# Patient Record
Sex: Male | Born: 1955 | Race: White | Hispanic: No | Smoking: Never smoker
Health system: Southern US, Community
[De-identification: ages and names within clinical notes are randomized; demographics above are authoritative.]

## PROBLEM LIST (undated history)

## (undated) DIAGNOSIS — I10 Essential (primary) hypertension: Secondary | ICD-10-CM

## (undated) DIAGNOSIS — I2510 Atherosclerotic heart disease of native coronary artery without angina pectoris: Secondary | ICD-10-CM

## (undated) HISTORY — PX: CORONARY STENT PLACEMENT: SHX1402

## (undated) HISTORY — PX: PACEMAKER INSERTION: SHX728

---

## 2020-08-08 ENCOUNTER — Emergency Department (HOSPITAL_COMMUNITY)
Admission: EM | Admit: 2020-08-08 | Discharge: 2020-08-09 | Disposition: A | Discharge status: Home / Self Care | Payer: 59 | Attending: Emergency Medicine | Admitting: Emergency Medicine

## 2020-08-08 DIAGNOSIS — R002 Palpitations: Secondary | ICD-10-CM | POA: Diagnosis not present

## 2020-08-08 DIAGNOSIS — R600 Localized edema: Secondary | ICD-10-CM | POA: Diagnosis not present

## 2020-08-08 DIAGNOSIS — I10 Essential (primary) hypertension: Secondary | ICD-10-CM | POA: Insufficient documentation

## 2020-08-08 DIAGNOSIS — I2510 Atherosclerotic heart disease of native coronary artery without angina pectoris: Secondary | ICD-10-CM | POA: Insufficient documentation

## 2020-08-08 HISTORY — DX: Essential (primary) hypertension: I10

## 2020-08-08 HISTORY — DX: Atherosclerotic heart disease of native coronary artery without angina pectoris: I25.10

## 2020-08-09 ENCOUNTER — Encounter (HOSPITAL_COMMUNITY): Payer: Self-pay | Admitting: Emergency Medicine

## 2020-08-09 ENCOUNTER — Emergency Department (HOSPITAL_COMMUNITY): Payer: 59

## 2020-08-09 ENCOUNTER — Other Ambulatory Visit: Payer: Self-pay

## 2020-08-09 LAB — BASIC METABOLIC PANEL
Anion gap: 10 (ref 5–15)
BUN: 8 mg/dL (ref 8–23)
CO2: 26 mmol/L (ref 22–32)
Calcium: 9.4 mg/dL (ref 8.9–10.3)
Chloride: 104 mmol/L (ref 98–111)
Creatinine, Ser: 0.78 mg/dL (ref 0.61–1.24)
GFR calc Af Amer: >60 mL/min (ref >60)
GFR calc non Af Amer: >60 mL/min (ref >60)
Glucose, Bld: 129 mg/dL — ABNORMAL HIGH (ref 70–99)
Potassium: 3.4 mmol/L — ABNORMAL LOW (ref 3.5–5.1)
Sodium: 140 mmol/L (ref 135–145)

## 2020-08-09 LAB — CBC
HCT: 52.5 % — ABNORMAL HIGH (ref 39.0–52.0)
Hemoglobin: 17.2 g/dL — ABNORMAL HIGH (ref 13.0–17.0)
MCH: 29.8 pg (ref 26.0–34.0)
MCHC: 32.8 g/dL (ref 30.0–36.0)
MCV: 91 fL (ref 80.0–100.0)
Platelets: 219 10*3/uL (ref 150–400)
RBC: 5.77 MIL/uL (ref 4.22–5.81)
RDW: 12.3 % (ref 11.5–15.5)
WBC: 5.4 10*3/uL (ref 4.0–10.5)
nRBC: 0 % (ref 0.0–0.2)

## 2020-08-09 LAB — TROPONIN I (HIGH SENSITIVITY)
Troponin I (High Sensitivity): 6 ng/L (ref <18)
Troponin I (High Sensitivity): 7 ng/L (ref <18)
Troponin I (High Sensitivity): 5 ng/L (ref <18)

## 2020-08-09 LAB — BRAIN NATRIURETIC PEPTIDE: B Natriuretic Peptide: 28 pg/mL (ref 0.0–100.0)

## 2020-08-09 NOTE — ED Notes (Signed)
[employer] Jude pace maker interrogated.  

## 2020-08-09 NOTE — ED Triage Notes (Signed)
Patient arrived with EMS from home reports palpitations " fluttering" this evening , denies chest pain, respirations unlabored , no cough or fever , history of CAD /Coronary Stents and pacemaker .

## 2020-08-09 NOTE — ED Provider Notes (Addendum)
[facility] EMERGENCY DEPARTMENT Provider Note   CSN: [id number] Arrival date & time: 08/08/20  2357     History Chief Complaint  Patient presents with  . Palpitations    Ryan Clarke is a 64 y.o. male.  The history is provided by the patient.  Palpitations Palpitations quality:  Fast Progression:  Resolved Chronicity:  New Context comment:  Hx of CAD s/p stents and defibrillator/pacer wh presents with palpitations. No ongoing chest pain. Last stent placed last summer. Recently moved from [place]. Noticed some edema in legs over the past month or so too. Relieved by:  Nothing Worsened by:  Nothing Associated symptoms: lower extremity edema   Associated symptoms: no back pain, no chest pain, no chest pressure, no cough, no diaphoresis, no dizziness, no hemoptysis, no shortness of breath, no vomiting and no weakness   Risk factors: heart disease        Past Medical History:  Diagnosis Date  . Coronary artery disease   . Hypertension     There are no problems to display for this patient.   PMH: CAD, pacemaker   No family history on file.  Social History   Tobacco Use  . Smoking status: Never Smoker  . Smokeless tobacco: Never Used  Substance Use Topics  . Alcohol use: Never  . Drug use: Never    Home Medications Prior to Admission medications   Not on File    Allergies    Patient has no known allergies.  Review of Systems   Review of Systems  Constitutional: Negative for chills, diaphoresis and fever.  HENT: Negative for ear pain and sore throat.   Eyes: Negative for pain and visual disturbance.  Respiratory: Negative for cough, hemoptysis and shortness of breath.   Cardiovascular: Positive for palpitations and leg swelling. Negative for chest pain.  Gastrointestinal: Negative for abdominal pain and vomiting.  Genitourinary: Negative for dysuria and hematuria.  Musculoskeletal: Negative for arthralgias and back pain.  Skin:  Negative for color change and rash.  Neurological: Negative for dizziness, seizures, syncope and weakness.  All other systems reviewed and are negative.   Physical Exam Updated Vital Signs  ED Triage Vitals  Enc Vitals Group     BP 08/09/20 0005 131/86     Pulse Rate 08/09/20 0005 80     Resp 08/09/20 0005 20     Temp 08/09/20 0005 98.3 F (36.8 C)     Temp Source 08/09/20 0005 Oral     SpO2 08/09/20 0005 93 %     Weight 08/09/20 0008 (!) 319 lb 10.7 oz (145 kg)     Height 08/09/20 0008 6\' 1"  (1.854 m)     Head Circumference --      Peak Flow --      Pain Score 08/09/20 0008 0     Pain Loc --      Pain Edu? --      Excl. in [place]? --     Physical Exam Vitals and nursing note reviewed.  Constitutional:      General: He is not in acute distress.    Appearance: He is well-developed. He is not ill-appearing.  HENT:     Head: Normocephalic and atraumatic.     Mouth/Throat:     Mouth: Mucous membranes are moist.  Eyes:     Extraocular Movements: Extraocular movements intact.     Conjunctiva/sclera: Conjunctivae normal.     Pupils: Pupils are equal, round, and reactive to light.  Cardiovascular:     Rate and Rhythm: Normal rate and regular rhythm.     Pulses: Normal pulses.     Heart sounds: Normal heart sounds. No murmur heard.   Pulmonary:     Effort: Pulmonary effort is normal. No respiratory distress.     Breath sounds: Normal breath sounds.  Abdominal:     Palpations: Abdomen is soft.     Tenderness: There is no abdominal tenderness.  Musculoskeletal:     Cervical back: Neck supple.     Right lower leg: Edema (1+) present.     Left lower leg: Edema (1+) present.  Skin:    General: Skin is warm and dry.     Capillary Refill: Capillary refill takes less than 2 seconds.  Neurological:     General: No focal deficit present.     Mental Status: He is alert and oriented to person, place, and time.     Cranial Nerves: No cranial nerve deficit.     Sensory: No sensory  deficit.     Motor: No weakness.     Coordination: Coordination normal.  Psychiatric:        Mood and Affect: Mood normal.     ED Results / Procedures / Treatments   Labs (all labs ordered are listed, but only abnormal results are displayed) Labs Reviewed  BASIC METABOLIC PANEL - Abnormal; Notable for the following components:      Result Value   Potassium 3.4 (*)    Glucose, Bld 129 (*)    All other components within normal limits  CBC - Abnormal; Notable for the following components:   Hemoglobin 17.2 (*)    HCT 52.5 (*)    All other components within normal limits  BRAIN NATRIURETIC PEPTIDE  TROPONIN I (HIGH SENSITIVITY)  TROPONIN I (HIGH SENSITIVITY)  TROPONIN I (HIGH SENSITIVITY)    EKG EKG Interpretation  Date/Time:  Thursday August 09 2020 08:05:44 EDT Ventricular Rate:  57 PR Interval:  190 QRS Duration: 165 QT Interval:  460 QTC Calculation: 448 R Axis:   -14 Text Interpretation: Sinus rhythm Right bundle branch block Confirmed by Lennice Sites [phone]) on 08/09/2020 8:27:14 AM   Radiology DG Chest 2 View  Result Date: 08/09/2020 CLINICAL DATA:  Cardiac palpitations EXAM: CHEST - 2 VIEW COMPARISON:  11/18/2016 FINDINGS: Cardiac shadow is stable. Pacing device is now seen in satisfactory position. Lungs are clear bilaterally. Degenerative changes of the thoracic spine are noted. IMPRESSION: No acute abnormality noted. Electronically Signed   By: Inez Catalina M.D.   On: 08/09/2020 00:56    Procedures Procedures (including critical care time)  Medications Ordered in ED Medications - No data to display  ED Course  I have reviewed the triage vital signs and the nursing notes.  Pertinent labs & imaging results that were available during my care of the patient were reviewed by me and considered in my medical decision making (see chart for details).    MDM Rules/Calculators/A&P                          Ryan Clarke is a 64 year old male who presents the  ED with palpitations.  History of CAD status post stents, status post pacemaker defibrillator.  Had some palpitations last night but has resolved.  Possibly some associated chest pressure.  No chest pain since then.  No shortness of breath.  Has noticed some swelling in his legs that has appeared over the past month  but gets better in the morning.  Not on any diuretics.  Just moved here from [place].  Last cardiac catheterization was last year.  Patient on antiplatelets.  Overall appears well.  Has some 1+ edema in both legs bilaterally but clear breath sounds.  No respiratory distress.  Has already had EKG, troponins, chest x-ray.  EKG shows right bundle branch block.  No obvious ischemic changes or ST changes.  Troponin negative x2 at 6 and 7.  Chest x-ray is clear with no signs of edema or pneumonia or pneumothorax.  No significant anemia, electrolyte abnormality, kidney injury.  Will have pacemaker evaluated and will add an additional troponin and BNP. Patient with no LOC, no syncope symptoms.  Pacemaker rep called states no issues with device, no concerning arrhythmias.  Third troponin is normal.  BNP is normal.  Doubt heart failure.  Overall patient likely with some palpitations that are PVCs are benign.  We will have him follow-up with cardiology.  Discharged in good condition.  Understands return precautions.  No chest pain to my care.  Doubt ACS.  This chart was dictated using voice recognition software.  Despite best efforts to proofread,  errors can occur which can change the documentation meaning.    Final Clinical Impression(s) / ED Diagnoses Final diagnoses:  Palpitations    Rx / DC Orders ED Discharge Orders    None       Lennice Sites, DO 08/09/20 6945    Lennice Sites, DO 08/09/20 1050

## 2020-08-14 ENCOUNTER — Other Ambulatory Visit: Payer: Self-pay

## 2020-08-14 ENCOUNTER — Encounter: Payer: Self-pay | Admitting: Cardiology

## 2020-08-14 ENCOUNTER — Ambulatory Visit (INDEPENDENT_AMBULATORY_CARE_PROVIDER_SITE_OTHER): Payer: 59 | Admitting: Cardiology

## 2020-08-14 VITALS — BP 140/60 | HR 62 | Temp 97.7°F | Ht 73.0 in | Wt 291.2 lb

## 2020-08-14 DIAGNOSIS — Z713 Dietary counseling and surveillance: Secondary | ICD-10-CM

## 2020-08-14 DIAGNOSIS — E7800 Pure hypercholesterolemia, unspecified: Secondary | ICD-10-CM

## 2020-08-14 DIAGNOSIS — R072 Precordial pain: Secondary | ICD-10-CM

## 2020-08-14 DIAGNOSIS — I25119 Atherosclerotic heart disease of native coronary artery with unspecified angina pectoris: Secondary | ICD-10-CM | POA: Diagnosis not present

## 2020-08-14 DIAGNOSIS — Z7189 Other specified counseling: Secondary | ICD-10-CM

## 2020-08-14 DIAGNOSIS — Z950 Presence of cardiac pacemaker: Secondary | ICD-10-CM | POA: Diagnosis not present

## 2020-08-14 DIAGNOSIS — I10 Essential (primary) hypertension: Secondary | ICD-10-CM | POA: Diagnosis not present

## 2020-08-14 MED ORDER — NITROGLYCERIN 0.4 MG SL SUBL: 0.4000 mg | SUBLINGUAL_TABLET | SUBLINGUAL | 3 refills | Status: DC | PRN

## 2020-08-14 NOTE — Progress Notes (Signed)
Cardiology Office Note:    Date:  08/14/2020   ID:  Eden Rho, DOB 1956-10-08, MRN [id number]  PCP:  Patient, No Pcp Per  Cardiologist:  Buford Dresser, MD  Referring MD: Lennice Sites, DO   CC: new patient consultation for palpitations  History of Present Illness:    Ezzard Ditmer is a 64 y.o. male with a hx of CAD with many stents who is seen as a new consult at the request of Lennice Sites, DO for the evaluation and management of palpitations.  ER note from 08/09/20 reviewed. No acute ECG changes, unremarkable troponins.  Per patient, he states that his chest discomfort was more like his prior angina, palpitations were only mild.  Had all of his prior cardiac care as part of [facility] in [place]. Not available in Care Everywhere. Reports history of 29 stents. Never had CABG, last stent 06/2019. Moved to [place] in 05/2020.  He reports that his usual angina is indigestion. He was having these symptoms and called 911 on 9/23. Felt better after belching. ER workup unremarkable. He endorses mild palpitations but indigestion is the main concern. [employer] checked his device and said there were no episodes. He has a dual chamber pacemaker, not an ICD. Reports that when the last stent was put in, his heart stopped. He had a pacemaker placed after this.   Has continued to have indigestion/discomfort with exertion. Not at rest. Has not tried nitro since he was in his 56s. Occurs intermittently, not clearly related to food. Is improved with belching.   Has claustrophobia, doesn't like enclosed scanners.  Denies shortness of breath at rest or with normal exertion. No PND, orthopnea, or unexpected weight gain. No syncope or palpitations. Has had more LE edema, improves with compression stockings  Past Medical History:  Diagnosis Date   Coronary artery disease    Hypertension     Past Surgical History:  Procedure Laterality Date   CORONARY STENT PLACEMENT     PACEMAKER INSERTION       Current Medications: Current Outpatient Medications on File Prior to Visit  Medication Sig   amLODipine (NORVASC) 10 MG tablet Take 10 mg by mouth daily.   aspirin EC 81 MG tablet Take 81 mg by mouth daily. Swallow whole.   atorvastatin (LIPITOR) 80 MG tablet Take 80 mg by mouth daily.   metoprolol succinate (TOPROL-XL) 50 MG 24 hr tablet Take by mouth.   prasugrel (EFFIENT) 10 MG TABS tablet Take 10 mg by mouth daily.   No current facility-administered medications on file prior to visit.     Allergies:   Patient has no known allergies.   Social History   Tobacco Use   Smoking status: Never Smoker   Smokeless tobacco: Never Used  Substance Use Topics   Alcohol use: Never   Drug use: Never    Family History: No premature history of CAD  ROS:   Please see the history of present illness.  Additional pertinent ROS: Constitutional: Negative for chills, fever, night sweats, unintentional weight loss  HENT: Negative for ear pain and hearing loss.   Eyes: Negative for loss of vision and eye pain.  Respiratory: Negative for cough, sputum, wheezing.   Cardiovascular: See HPI. Gastrointestinal: Negative for abdominal pain, melena, and hematochezia.  Genitourinary: Negative for dysuria and hematuria.  Musculoskeletal: Negative for falls and myalgias.  Skin: Negative for itching and rash.  Neurological: Negative for focal weakness, focal sensory changes and loss of consciousness.  Endo/Heme/Allergies: Does not bruise/bleed  easily.     EKGs/Labs/Other Studies Reviewed:    The following studies were reviewed today: No prior cardiac studies available  EKG:  EKG is personally reviewed.  The ekg ordered today demonstrates NSR, RBBB, prior inferior infarct  Recent Labs: 08/09/2020: B Natriuretic Peptide 28.0; BUN 8; Creatinine, Ser 0.78; Hemoglobin 17.2; Platelets 219; Potassium 3.4; Sodium 140  Recent Lipid Panel No results found for: CHOL, TRIG, HDL, CHOLHDL, VLDL,  LDLCALC, LDLDIRECT  Physical Exam:    VS:  BP 140/60    Pulse 62    Temp 97.7 F (36.5 C)    Ht 6\' 1"  (1.854 m)    Wt 291 lb 3.2 oz (132.1 kg)    SpO2 93%    BMI 38.42 kg/m     Wt Readings from Last 3 Encounters:  08/14/20 291 lb 3.2 oz (132.1 kg)  08/09/20 (!) 319 lb 10.7 oz (145 kg)    GEN: Well nourished, well developed in no acute distress HEENT: Normal, moist mucous membranes NECK: No JVD CARDIAC: regular rhythm, normal S1 and S2, no rubs or gallops. No murmurs. VASCULAR: Radial and DP pulses 2+ bilaterally. No carotid bruits RESPIRATORY:  Clear to auscultation without rales, wheezing or rhonchi  ABDOMEN: Soft, non-tender, non-distended MUSCULOSKELETAL:  Ambulates independently SKIN: Warm and dry, no edema NEUROLOGIC:  Alert and oriented x 3. No focal neuro deficits noted. PSYCHIATRIC:  Normal affect    ASSESSMENT:    1. Coronary artery disease involving native coronary artery of native heart with angina pectoris ([place])   2. Precordial pain   3. Pacemaker   4. Essential hypertension   5. Pure hypercholesterolemia   6. Counseling on health promotion and disease prevention   7. Nutritional counseling    PLAN:    Coronary artery disease, with reported 29 prior stents in [place] (no records) Possible stable angina vs. GI symptoms as cause of chest pain -given his symptoms, now only exertional, will perform stress test, 2 day, at [address] as he want as little enclosed space as possible -ordered nitroglycerin, instructed on use -instructed on red flag warning signs that need immediate medical attention -Lipids, echo at follow up -continue aspirin, prasugrel -continue atorvastatin -continue metoprolol, amlodipine. Consider imdur for antianginal  S/P Dual chamber St Jude pacemaker -will have him establish with device clinic  Hypertension: slightly elevated today -reassess at follow up -continue amlodipine, metoprolol  Hyperlipidemia: -LDL goal <70 -will check at  followup -continue atorvastatin  Cardiac risk counseling and prevention recommendations: -recommend heart healthy/Mediterranean diet, with whole grains, fruits, vegetable, fish, lean meats, nuts, and olive oil. Limit salt. -recommend moderate walking, 3-5 times/week for 30-50 minutes each session. Aim for at least 150 minutes.week. Goal should be pace of 3 miles/hours, or walking 1.5 miles in 30 minutes -recommend avoidance of tobacco products. Avoid excess alcohol. -ASCVD risk score: The ASCVD Risk score Mikey Bussing DC Jr., et al., 2013) failed to calculate for the following reasons:   Cannot find a previous HDL lab   Cannot find a previous total cholesterol lab    Plan for follow up: close follow up, within 2-3 weeks  Buford Dresser, MD, PhD [place]   [facility]    Medication Adjustments/Labs and Tests Ordered: Current medicines are reviewed at length with the patient today.  Concerns regarding medicines are outlined above.  Orders Placed This Encounter  Procedures   Ambulatory referral to Cardiac Electrophysiology   MYOCARDIAL PERFUSION IMAGING   EKG 12-Lead   Meds ordered this encounter  Medications   nitroGLYCERIN (NITROSTAT) 0.4 MG SL tablet    Sig: Place 1 tablet (0.4 mg total) under the tongue every 5 (five) minutes as needed for chest pain.    Dispense:  25 tablet    Refill:  3    Patient Instructions  Medication Instructions:   Nitroglycerin- Place 1 tablet (0.4 mg total) under the tongue every 5 (five) minutes as needed for chest pain as directed.  *If you need a refill on your cardiac medications before your next appointment, please call your pharmacy*   Lab Work: None ordered    Testing/Procedures: Your physician has requested that you have a 2 day lexiscan myoview. For further information please visit [email or web address]. Please follow instruction sheet, as given. [place] [address]    Follow-Up: At [facility], you and  your health needs are our priority.  As part of our continuing mission to provide you with exceptional heart care, we have created designated Provider Care Teams.  These Care Teams include your primary Cardiologist (physician) and Advanced Practice Providers (APPs -  Physician Assistants and Nurse Practitioners) who all work together to provide you with the care you need, when you need it.  We recommend signing up for the patient portal called "MyChart".  Sign up information is provided on this After Visit Summary.  MyChart is used to connect with patients for Virtual Visits (Telemedicine).  Patients are able to view lab/test results, encounter notes, upcoming appointments, etc.  Non-urgent messages can be sent to your provider as well.   To learn more about what you can do with MyChart, go to [email or web address].    Your next appointment:   2-3 week(s) after test procedure  The format for your next appointment:   In Person  Provider:   Buford Dresser, MD    You are scheduled for a Myocardial Perfusion Imaging Study on.  Please arrive 15 minutes prior to your appointment time for registration and insurance purposes.  The test will take approximately 3 to 4 hours to complete; you may bring reading material.  If someone comes with you to your appointment, they will need to remain in the main lobby due to limited space in the testing area. **If you are pregnant or breastfeeding, please notify the nuclear lab prior to your appointment**  How to prepare for your Myocardial Perfusion Test:  Do not eat or drink 3 hours prior to your test, except you may have water.  Do not consume products containing caffeine (regular or decaffeinated) 12 hours prior to your test. (ex: coffee, chocolate, sodas, tea).  Do bring a list of your current medications with you.  If not listed below, you may take your medications as normal.  Do wear comfortable clothes (no dresses or overalls) and walking  shoes, tennis shoes preferred (No heels or open toe shoes are allowed).  Do NOT wear cologne, perfume, aftershave, or lotions (deodorant is allowed).  If these instructions are not followed, your test will have to be rescheduled.  Please report to [address] for your test.  If you have questions or concerns about your appointment, you can call the Nuclear Lab at [phone].  If you cannot keep your appointment, please provide 24 hours notification to the Nuclear Lab, to avoid a possible $50 charge to your account.    Signed, Buford Dresser, MD PhD 08/14/2020   [place]

## 2020-08-14 NOTE — Patient Instructions (Addendum)
Medication Instructions:   Nitroglycerin- Place 1 tablet (0.4 mg total) under the tongue every 5 (five) minutes as needed for chest pain as directed.  *If you need a refill on your cardiac medications before your next appointment, please call your pharmacy*   Lab Work: None ordered    Testing/Procedures: Your physician has requested that you have a 2 day lexiscan myoview. For further information please visit [email or web address]. Please follow instruction sheet, as given. [place] [address]    Follow-Up: At [facility], you and your health needs are our priority.  As part of our continuing mission to provide you with exceptional heart care, we have created designated Provider Care Teams.  These Care Teams include your primary Cardiologist (physician) and Advanced Practice Providers (APPs -  Physician Assistants and Nurse Practitioners) who all work together to provide you with the care you need, when you need it.  We recommend signing up for the patient portal called "MyChart".  Sign up information is provided on this After Visit Summary.  MyChart is used to connect with patients for Virtual Visits (Telemedicine).  Patients are able to view lab/test results, encounter notes, upcoming appointments, etc.  Non-urgent messages can be sent to your provider as well.   To learn more about what you can do with MyChart, go to [email or web address].    Your next appointment:   2-3 week(s) after test procedure  The format for your next appointment:   In Person  Provider:   Buford Dresser, MD    You are scheduled for a Myocardial Perfusion Imaging Study on.  Please arrive 15 minutes prior to your appointment time for registration and insurance purposes.  The test will take approximately 3 to 4 hours to complete; you may bring reading material.  If someone comes with you to your appointment, they will need to remain in the main lobby due to limited space in the testing  area. **If you are pregnant or breastfeeding, please notify the nuclear lab prior to your appointment**  How to prepare for your Myocardial Perfusion Test: . Do not eat or drink 3 hours prior to your test, except you may have water. . Do not consume products containing caffeine (regular or decaffeinated) 12 hours prior to your test. (ex: coffee, chocolate, sodas, tea). . Do bring a list of your current medications with you.  If not listed below, you may take your medications as normal. . Do wear comfortable clothes (no dresses or overalls) and walking shoes, tennis shoes preferred (No heels or open toe shoes are allowed). . Do NOT wear cologne, perfume, aftershave, or lotions (deodorant is allowed). . If these instructions are not followed, your test will have to be rescheduled.  Please report to [address] for your test.  If you have questions or concerns about your appointment, you can call the Nuclear Lab at [phone].  If you cannot keep your appointment, please provide 24 hours notification to the Nuclear Lab, to avoid a possible $50 charge to your account.

## 2020-08-15 ENCOUNTER — Encounter: Payer: Self-pay | Admitting: Cardiology

## 2020-08-15 DIAGNOSIS — I2510 Atherosclerotic heart disease of native coronary artery without angina pectoris: Secondary | ICD-10-CM | POA: Insufficient documentation

## 2020-08-15 DIAGNOSIS — I10 Essential (primary) hypertension: Secondary | ICD-10-CM | POA: Insufficient documentation

## 2020-08-16 DIAGNOSIS — Z950 Presence of cardiac pacemaker: Secondary | ICD-10-CM | POA: Insufficient documentation

## 2020-08-16 NOTE — Progress Notes (Signed)
ELECTROPHYSIOLOGY CONSULT NOTE  Patient ID: Nash Bolls, MRN: [id number], DOB/AGE: 03/28/1956 64 y.o. Admit date: (Not on file) Date of Consult: 08/17/2020  Primary Physician: Patient, No Pcp Per Primary Cardiologist: Stevan Eberwein is a 64 y.o. male who is being seen today for the evaluation of pacemaker at the request of  [facility].    HPI Doyle Kunath is a 64 y.o. male  Seen to establish followup for a St Jude pacemaker implanted for pauses during catheterization in 2019. Hx of complex CAD with "29 stents," most recently 8/20 ( in [place])  50 pound weight gain since Covid.  Some dyspnea on exertion.  Some edema.  Exertional indigestion.  Seen recently in the ER.  Troponins were negative.  Discussed with Dr. [facility]  Stress testing.  Date Cr K Hgb  9/21 0.78 3.4 17.2      Past Medical History:  Diagnosis Date  . Coronary artery disease   . Hypertension       Surgical History:  Past Surgical History:  Procedure Laterality Date  . CORONARY STENT PLACEMENT    . PACEMAKER INSERTION       Home Meds: Current Meds  Medication Sig  . amLODipine (NORVASC) 10 MG tablet Take 10 mg by mouth daily.  Marland Kitchen aspirin EC 81 MG tablet Take 81 mg by mouth daily. Swallow whole.  Marland Kitchen atorvastatin (LIPITOR) 80 MG tablet Take 80 mg by mouth daily.  . metoprolol succinate (TOPROL-XL) 50 MG 24 hr tablet Take by mouth.  . nitroGLYCERIN (NITROSTAT) 0.4 MG SL tablet Place 1 tablet (0.4 mg total) under the tongue every 5 (five) minutes as needed for chest pain.  . prasugrel (EFFIENT) 10 MG TABS tablet Take 10 mg by mouth daily.    Allergies: No Known Allergies  Social History   Socioeconomic History  . Marital status: Legally Separated    Spouse name: Not on file  . Number of children: Not on file  . Years of education: Not on file  . Highest education level: Not on file  Occupational History  . Not on file  Tobacco Use  . Smoking status: Never Smoker  . Smokeless tobacco: Never Used    Substance and Sexual Activity  . Alcohol use: Never  . Drug use: Never  . Sexual activity: Not on file  Other Topics Concern  . Not on file  Social History Narrative  . Not on file   Social Determinants of Health   Financial Resource Strain:   . Difficulty of Paying Living Expenses: Not on file  Food Insecurity:   . Worried About Charity fundraiser in the Last Year: Not on file  . Ran Out of Food in the Last Year: Not on file  Transportation Needs:   . Lack of Transportation (Medical): Not on file  . Lack of Transportation (Non-Medical): Not on file  Physical Activity:   . Days of Exercise per Week: Not on file  . Minutes of Exercise per Session: Not on file  Stress:   . Feeling of Stress : Not on file  Social Connections:   . Frequency of Communication with Friends and Family: Not on file  . Frequency of Social Gatherings with Friends and Family: Not on file  . Attends Religious Services: Not on file  . Active Member of Clubs or Organizations: Not on file  . Attends Archivist Meetings: Not on file  . Marital Status: Not on file  Intimate Partner Violence:   . Fear of Current or Ex-Partner: Not on file  . Emotionally Abused: Not on file  . Physically Abused: Not on file  . Sexually Abused: Not on file     History reviewed. No pertinent family history.   ROS:  Please see the history of present illness.     All other systems reviewed and negative.    Physical Exam: Blood pressure 122/76, pulse 74, height 6\' 1"  (1.854 m), weight 294 lb 9.6 oz (133.6 kg), SpO2 93 %. General: Well developed obese male in no acute distress. Head: Normocephalic, atraumatic, sclera non-icteric, no xanthomas, nares are without discharge. EENT: normal  Lymph Nodes:  none Neck: Negative for carotid bruits. JVD not elevated. Back:without scoliosis kyphosis Lungs: Clear bilaterally to auscultation without wheezes, rales, or rhonchi. Breathing is unlabored. Heart: RRR with S1 S2.  No  murmur . No rubs, or gallops appreciated. Abdomen: Soft, non-tender, non-distended with normoactive bowel sounds. No hepatomegaly. No rebound/guarding. No obvious abdominal masses. Msk:  Strength and tone appear normal for age. Extremities: No clubbing or cyanosis. No edema.  Distal pedal pulses are 2+ and equal bilaterally. Skin: Warm and Dry Neuro: Alert and oriented X 3. CN III-XII intact Grossly normal sensory and motor function . Psych:  Responds to questions appropriately with a normal affect.      Labs: Cardiac Enzymes No results for input(s): CKTOTAL, CKMB, TROPONINI in the last 72 hours. CBC Lab Results  Component Value Date   WBC 5.4 08/09/2020   HGB 17.2 (H) 08/09/2020   HCT 52.5 (H) 08/09/2020   MCV 91.0 08/09/2020   PLT 219 08/09/2020   PROTIME: No results for input(s): LABPROT, INR in the last 72 hours. Chemistry No results for input(s): NA, K, CL, CO2, BUN, CREATININE, CALCIUM, PROT, BILITOT, ALKPHOS, ALT, AST, GLUCOSE in the last 168 hours.  Invalid input(s): LABALBU Lipids No results found for: CHOL, HDL, LDLCALC, TRIG BNP No results found for: PROBNP Thyroid Function Tests: No results for input(s): TSH, T4TOTAL, T3FREE, THYROIDAB in the last 72 hours.  Invalid input(s): FREET3 Miscellaneous No results found for: DDIMER  Radiology/Studies:  DG Chest 2 View  Result Date: 08/09/2020 CLINICAL DATA:  Cardiac palpitations EXAM: CHEST - 2 VIEW COMPARISON:  11/18/2016 FINDINGS: Cardiac shadow is stable. Pacing device is now seen in satisfactory position. Lungs are clear bilaterally. Degenerative changes of the thoracic spine are noted. IMPRESSION: No acute abnormality noted. Electronically Signed   By: Inez Catalina M.D.   On: 08/09/2020 00:56    EKG: Sinus at 73 Intervals 20/15/43 Right bundle branch block   Assessment and Plan:  Pacemaker-Saint Jude  Pauses  Ischemic heart disease multiple stents  Exertional chest  discomfort  Obesity  Hypertension  Polycythemia   Patient's device function is normal.  We will establish office in remote follow-up.  Exertional chest discomfort is concerning.  He is scheduled for noninvasive imaging; I wonder whether the pretest likelihood's are not sufficiently high that a posttest probability is good be too low if the test is negative.  I reached out to Dr. [facility] and will discuss this with her.  Blood pressure is well controlled.  Urged exercise and weight loss.  Polycythemia and review of labs from [place] 3/22 also over 17.5.  We will check erythropoietin level  He has moved to [place] because his daughter and granddaughter live here.        Virl Axe '

## 2020-08-17 ENCOUNTER — Encounter: Payer: Self-pay | Admitting: Internal Medicine

## 2020-08-17 ENCOUNTER — Ambulatory Visit (INDEPENDENT_AMBULATORY_CARE_PROVIDER_SITE_OTHER): Payer: 59 | Admitting: Internal Medicine

## 2020-08-17 ENCOUNTER — Other Ambulatory Visit: Payer: Self-pay

## 2020-08-17 VITALS — BP 122/76 | HR 74 | Ht 73.0 in | Wt 294.6 lb

## 2020-08-17 DIAGNOSIS — I2510 Atherosclerotic heart disease of native coronary artery without angina pectoris: Secondary | ICD-10-CM

## 2020-08-17 DIAGNOSIS — E785 Hyperlipidemia, unspecified: Secondary | ICD-10-CM | POA: Diagnosis not present

## 2020-08-17 DIAGNOSIS — I455 Other specified heart block: Secondary | ICD-10-CM | POA: Diagnosis not present

## 2020-08-17 DIAGNOSIS — Z950 Presence of cardiac pacemaker: Secondary | ICD-10-CM | POA: Diagnosis not present

## 2020-08-17 NOTE — Patient Instructions (Addendum)
Medication Instructions:  Your physician recommends that you continue on your current medications as directed. Please refer to the Current Medication list given to you today.  *If you need a refill on your cardiac medications before your next appointment, please call your pharmacy*   Lab Work: Lipid Profile with Direct LDL and Erythropoietin level today  If you have labs (blood work) drawn today and your tests are completely normal, you will receive your results only by: Marland Kitchen MyChart Message (if you have MyChart) OR . A paper copy in the mail If you have any lab test that is abnormal or we need to change your treatment, we will call you to review the results.   Testing/Procedures: None ordered.    Follow-Up: At [facility], you and your health needs are our priority.  As part of our continuing mission to provide you with exceptional heart care, we have created designated Provider Care Teams.  These Care Teams include your primary Cardiologist (physician) and Advanced Practice Providers (APPs -  Physician Assistants and Nurse Practitioners) who all work together to provide you with the care you need, when you need it.  We recommend signing up for the patient portal called "MyChart".  Sign up information is provided on this After Visit Summary.  MyChart is used to connect with patients for Virtual Visits (Telemedicine).  Patients are able to view lab/test results, encounter notes, upcoming appointments, etc.  Non-urgent messages can be sent to your provider as well.   To learn more about what you can do with MyChart, go to [email or web address].    Your next appointment:  9 months with Dr Caryl Comes

## 2020-08-19 LAB — LIPID PANEL
Chol/HDL Ratio: 3 ratio (ref 0.0–5.0)
Cholesterol, Total: 96 mg/dL — ABNORMAL LOW (ref 100–199)
HDL: 32 mg/dL — ABNORMAL LOW (ref >39)
LDL Chol Calc (NIH): 35 mg/dL (ref 0–99)
Triglycerides: 174 mg/dL — ABNORMAL HIGH (ref 0–149)
VLDL Cholesterol Cal: 29 mg/dL (ref 5–40)

## 2020-08-19 LAB — ERYTHROPOIETIN: Erythropoietin: 7.6 m[IU]/mL (ref 2.6–18.5)

## 2020-08-19 LAB — LDL CHOLESTEROL, DIRECT: LDL Direct: 33 mg/dL (ref 0–99)

## 2020-08-29 ENCOUNTER — Telehealth (HOSPITAL_COMMUNITY): Payer: Self-pay | Admitting: *Deleted

## 2020-08-29 NOTE — Telephone Encounter (Signed)
Left message on voicemail per DPR in reference to upcoming appointment scheduled on 09/03/20 at 1:15 with detailed instructions given per Myocardial Perfusion Study Information Sheet for the test. LM to arrive 15 minutes early, and that it is imperative to arrive on time for appointment to keep from having the test rescheduled. If you need to cancel or reschedule your appointment, please call the office within 24 hours of your appointment. Failure to do so may result in a cancellation of your appointment, and a $50 no show fee. Phone number given for call back for any questions.

## 2020-08-30 ENCOUNTER — Telehealth: Payer: Self-pay

## 2020-08-30 NOTE — Telephone Encounter (Signed)
I spoke with [facility] to request the pt to be release. She states she will talk with the nurse practitioner and have her call back.

## 2020-08-31 NOTE — Telephone Encounter (Signed)
Kurien from [employer] in [place] called and stated that the request has been released.  The pt just has to be enrolled on [facility]lin now

## 2020-08-31 NOTE — Telephone Encounter (Signed)
Enrolled in [facility] clinic.

## 2020-09-03 ENCOUNTER — Ambulatory Visit (HOSPITAL_COMMUNITY): Payer: 59

## 2020-09-03 ENCOUNTER — Telehealth: Payer: Self-pay

## 2020-09-03 ENCOUNTER — Telehealth: Payer: Self-pay | Admitting: Cardiology

## 2020-09-03 ENCOUNTER — Encounter (HOSPITAL_COMMUNITY): Payer: Self-pay

## 2020-09-03 NOTE — Telephone Encounter (Signed)
Spoke to patient he stated he cancelled Lexiscan that was scheduled for today.Stated he had chest pain last night that was relieved by NTG x 1.Stated he would like to have cardiac cath scheduled instead.Advised I will send message to Dr.Christopher.

## 2020-09-03 NOTE — Telephone Encounter (Signed)
Called patient no answer.LMTC. 

## 2020-09-03 NOTE — Telephone Encounter (Signed)
Merlin alert received, long AT/AT episode in progress.  Reviewed chart, noted pt does not have history of AF.  Reviewed with Dr. Rayann Heman who confirmed AF with RVR at times, he advised if pt not currently having chest pain, refer to AF clinic.  Spoke with pt, he reports at approx 1 or 2 am he awoke with racing heart and chest pain.  He took a NTG tab and it resolved.  He was supposed to have a stress test today, but he called and cancelled it due to the chest pain episode overnight.  He had spoke with Nurse for Dr. Harrell Gave about possibly having a LHC instead given history of CAD.  Assisted pt with sending manual transmission. Confirmed pt still in AF and episode at 1am was possibly RVR as he had 5 minute HVR at 01:22.    Pt meds include Metoprolol 25mg  daily, effient and ASA.    Educated p on Afib and concerns including stroke risk and symptoms.  Povided with Ed precautions including unresolved chest pan or if HR >120bpm for more than 30 minutes of rest.  Also instructed pt that if HR races can take one extra dose of Metoprolol (med list indicates dose if 50mg  daily).    Pt agreeable with AF clinic and understands he will receive phone call to set up appt.    Presenting Rhythm  Onset    Possibly sypmtomaticRVR episode

## 2020-09-03 NOTE — Telephone Encounter (Signed)
New message:     Patient stating that he can not do the stress test due to him having some SOB. Please call patient back

## 2020-09-03 NOTE — Telephone Encounter (Signed)
Please schedule him with an appointment so that we can discuss cath. If he has severe unrelenting chest pain in the interim he should call 911.

## 2020-09-04 ENCOUNTER — Other Ambulatory Visit: Payer: Self-pay

## 2020-09-04 ENCOUNTER — Ambulatory Visit (HOSPITAL_COMMUNITY)
Admission: RE | Admit: 2020-09-04 | Discharge: 2020-09-04 | Disposition: A | Discharge status: Home / Self Care | Payer: 59 | Source: Ambulatory Visit | Attending: Physician Assistant | Admitting: Physician Assistant

## 2020-09-04 ENCOUNTER — Ambulatory Visit (HOSPITAL_COMMUNITY): Payer: 59

## 2020-09-04 ENCOUNTER — Telehealth (HOSPITAL_COMMUNITY): Payer: Self-pay

## 2020-09-04 VITALS — BP 128/94 | HR 77 | Ht 73.0 in | Wt 292.8 lb

## 2020-09-04 DIAGNOSIS — Z6838 Body mass index (BMI) 38.0-38.9, adult: Secondary | ICD-10-CM | POA: Diagnosis not present

## 2020-09-04 DIAGNOSIS — D6869 Other thrombophilia: Secondary | ICD-10-CM | POA: Diagnosis not present

## 2020-09-04 DIAGNOSIS — E785 Hyperlipidemia, unspecified: Secondary | ICD-10-CM | POA: Insufficient documentation

## 2020-09-04 DIAGNOSIS — I2510 Atherosclerotic heart disease of native coronary artery without angina pectoris: Secondary | ICD-10-CM | POA: Diagnosis not present

## 2020-09-04 DIAGNOSIS — E669 Obesity, unspecified: Secondary | ICD-10-CM | POA: Diagnosis not present

## 2020-09-04 DIAGNOSIS — Z7982 Long term (current) use of aspirin: Secondary | ICD-10-CM | POA: Diagnosis not present

## 2020-09-04 DIAGNOSIS — I480 Paroxysmal atrial fibrillation: Secondary | ICD-10-CM | POA: Diagnosis present

## 2020-09-04 DIAGNOSIS — I10 Essential (primary) hypertension: Secondary | ICD-10-CM | POA: Diagnosis not present

## 2020-09-04 DIAGNOSIS — Z79899 Other long term (current) drug therapy: Secondary | ICD-10-CM | POA: Insufficient documentation

## 2020-09-04 DIAGNOSIS — Z950 Presence of cardiac pacemaker: Secondary | ICD-10-CM | POA: Insufficient documentation

## 2020-09-04 NOTE — Telephone Encounter (Signed)
Patient returned my call and is aware of appt today 09/04/20 at 2 pm with Adline Peals, PA.

## 2020-09-04 NOTE — Telephone Encounter (Signed)
Called and left message for patient to call back.

## 2020-09-04 NOTE — Progress Notes (Signed)
Primary Care Physician: Patient, No Pcp Per Primary Cardiologist: Dr Harrell Gave  Primary Electrophysiologist: Dr Caryl Comes Referring Physician: Device clinic/Dr Matas Burrows is a 64 y.o. male with a history of CAD s/p several stents, HTN, HLD, polycythemia, PPM, and paroxysmal atrial fibrillation who presents for consultation in the [facility]. The patient was initially diagnosed with atrial fibrillation 09/01/20 with the device clinic receiving and alert for an ongoing episode, overall rate controlled. Patient has a CHADS2VASC score of 2. He has been experiencing exertional chest discomfort over the last several weeks. He also had heart racing which corresponded to the afib detected on his device. He has an appointment with Dr Harrell Gave to discuss [place] later this week.   Today, he denies symptoms of orthopnea, PND, lower extremity edema, dizziness, presyncope, syncope, snoring, daytime somnolence, bleeding, or neurologic sequela. The patient is tolerating medications without difficulties and is otherwise without complaint today.    Atrial Fibrillation Risk Factors:  he does not have symptoms or diagnosis of sleep apnea. he does not have a history of rheumatic fever.   he has a BMI of Body mass index is 38.63 kg/m.Marland Kitchen Filed Weights   09/04/20 1419  Weight: 132.8 kg    No family history on file.   Atrial Fibrillation Management history:  Previous antiarrhythmic drugs: none Previous cardioversions: none Previous ablations: none CHADS2VASC score: 2 Anticoagulation history: none   Past Medical History:  Diagnosis Date  . Coronary artery disease   . Hypertension    Past Surgical History:  Procedure Laterality Date  . CORONARY STENT PLACEMENT    . PACEMAKER INSERTION      Current Outpatient Medications  Medication Sig Dispense Refill  . amLODipine (NORVASC) 10 MG tablet Take 10 mg by mouth daily.    Marland Kitchen aspirin EC 81 MG tablet Take 81 mg  by mouth daily. Swallow whole.    Marland Kitchen atorvastatin (LIPITOR) 80 MG tablet Take 80 mg by mouth daily.    . metoprolol succinate (TOPROL-XL) 25 MG 24 hr tablet Take 25 mg by mouth daily.    . nitroGLYCERIN (NITROSTAT) 0.4 MG SL tablet Place 1 tablet (0.4 mg total) under the tongue every 5 (five) minutes as needed for chest pain. 25 tablet 3  . prasugrel (EFFIENT) 10 MG TABS tablet Take 10 mg by mouth daily.     No current facility-administered medications for this encounter.    No Known Allergies  Social History   Socioeconomic History  . Marital status: Legally Separated    Spouse name: Not on file  . Number of children: Not on file  . Years of education: Not on file  . Highest education level: Not on file  Occupational History  . Not on file  Tobacco Use  . Smoking status: Never Smoker  . Smokeless tobacco: Never Used  Substance and Sexual Activity  . Alcohol use: Never  . Drug use: Never  . Sexual activity: Not on file  Other Topics Concern  . Not on file  Social History Narrative  . Not on file   Social Determinants of Health   Financial Resource Strain:   . Difficulty of Paying Living Expenses: Not on file  Food Insecurity:   . Worried About Charity fundraiser in the Last Year: Not on file  . Ran Out of Food in the Last Year: Not on file  Transportation Needs:   . Lack of Transportation (Medical): Not on file  . Lack of  Transportation (Non-Medical): Not on file  Physical Activity:   . Days of Exercise per Week: Not on file  . Minutes of Exercise per Session: Not on file  Stress:   . Feeling of Stress : Not on file  Social Connections:   . Frequency of Communication with Friends and Family: Not on file  . Frequency of Social Gatherings with Friends and Family: Not on file  . Attends Religious Services: Not on file  . Active Member of Clubs or Organizations: Not on file  . Attends Archivist Meetings: Not on file  . Marital Status: Not on file    Intimate Partner Violence:   . Fear of Current or Ex-Partner: Not on file  . Emotionally Abused: Not on file  . Physically Abused: Not on file  . Sexually Abused: Not on file     ROS- All systems are reviewed and negative except as per the HPI above.  Physical Exam: Vitals:   09/04/20 1419  BP: (!) 128/94  Pulse: 77  Weight: 132.8 kg  Height: 6\' 1"  (1.854 m)    GEN- The patient is well appearing obese male, alert and oriented x 3 today.   Head- normocephalic, atraumatic Eyes-  Sclera clear, conjunctiva pink Ears- hearing intact Oropharynx- clear Neck- supple  Lungs- Clear to ausculation bilaterally, normal work of breathing Heart- Regular rate and rhythm, no murmurs, rubs or gallops  GI- soft, NT, ND, + BS Extremities- no clubbing, cyanosis, or edema MS- no significant deformity or atrophy Skin- no rash or lesion Psych- euthymic mood, full affect Neuro- strength and sensation are intact  Wt Readings from Last 3 Encounters:  09/04/20 132.8 kg  08/17/20 133.6 kg  08/14/20 132.1 kg    EKG today demonstrates SR HR 77, RBBB, LAFB, PR 196, QRS 148, QTc 479  Epic records are reviewed at length today  CHA2DS2-VASc Score = 2  The patient's score is based upon: CHF History: 0 HTN History: 1 Diabetes History: 0 Stroke History: 0 Vascular Disease History: 1 Age Score: 0 Gender Score: 0      ASSESSMENT AND PLAN: 1. Paroxysmal Atrial Fibrillation (ICD10:  I48.0) The patient's CHA2DS2-VASc score is 2, indicating a 2.2% annual risk of stroke.   New onset afib on device. He is back in SR. General education about afib provided and questions answered. We also discussed his stroke risk and the risks and benefits of anticoagulation. ? 2/2 CAD. Discussed timing of anticoagulation with Dr Harrell Gave. Given he will likely need LHC to evaluate his exertional symptoms, will hold on anticoagulation for now. Plan to start after LHC.  Continue Toprol 25 mg daily.  2. Secondary  Hypercoagulable State (ICD10:  D68.69) The patient is at significant risk for stroke/thromboembolism based upon his CHA2DS2-VASc Score of 2.  See plans above.  3. Obesity Body mass index is 38.63 kg/m. Lifestyle modification was discussed at length including regular exercise and weight reduction.  4. CAD Patient has an extensive history of coronary disease. Exertional chest pressure concerning. He has an appt later this week to discuss [place]. Continue DAPT for now.  5. HTN Stable, no changes today.  6. PPM Followed by Dr Caryl Comes and the device clinic.   Follow up with Dr Harrell Gave as scheduled. AF clinic in 3 weeks.    [facility] [address] [place], [place] [address] [phone] 09/04/2020 2:44 PM

## 2020-09-04 NOTE — H&P (View-Only) (Signed)
Primary Care Physician: Patient, No Pcp Per Primary Cardiologist: Dr Harrell Gave  Primary Electrophysiologist: Dr Caryl Comes Referring Physician: Device clinic/Dr Kregg Cihlar is a 64 y.o. male with a history of CAD s/p several stents, HTN, HLD, polycythemia, PPM, and paroxysmal atrial fibrillation who presents for consultation in the [facility]. The patient was initially diagnosed with atrial fibrillation 09/01/20 with the device clinic receiving and alert for an ongoing episode, overall rate controlled. Patient has a CHADS2VASC score of 2. He has been experiencing exertional chest discomfort over the last several weeks. He also had heart racing which corresponded to the afib detected on his device. He has an appointment with Dr Harrell Gave to discuss [place] later this week.   Today, he denies symptoms of orthopnea, PND, lower extremity edema, dizziness, presyncope, syncope, snoring, daytime somnolence, bleeding, or neurologic sequela. The patient is tolerating medications without difficulties and is otherwise without complaint today.    Atrial Fibrillation Risk Factors:  he does not have symptoms or diagnosis of sleep apnea. he does not have a history of rheumatic fever.   he has a BMI of Body mass index is 38.63 kg/m.Marland Kitchen Filed Weights   09/04/20 1419  Weight: 132.8 kg    No family history on file.   Atrial Fibrillation Management history:  Previous antiarrhythmic drugs: none Previous cardioversions: none Previous ablations: none CHADS2VASC score: 2 Anticoagulation history: none   Past Medical History:  Diagnosis Date  . Coronary artery disease   . Hypertension    Past Surgical History:  Procedure Laterality Date  . CORONARY STENT PLACEMENT    . PACEMAKER INSERTION      Current Outpatient Medications  Medication Sig Dispense Refill  . amLODipine (NORVASC) 10 MG tablet Take 10 mg by mouth daily.    Marland Kitchen aspirin EC 81 MG tablet Take 81 mg  by mouth daily. Swallow whole.    Marland Kitchen atorvastatin (LIPITOR) 80 MG tablet Take 80 mg by mouth daily.    . metoprolol succinate (TOPROL-XL) 25 MG 24 hr tablet Take 25 mg by mouth daily.    . nitroGLYCERIN (NITROSTAT) 0.4 MG SL tablet Place 1 tablet (0.4 mg total) under the tongue every 5 (five) minutes as needed for chest pain. 25 tablet 3  . prasugrel (EFFIENT) 10 MG TABS tablet Take 10 mg by mouth daily.     No current facility-administered medications for this encounter.    No Known Allergies  Social History   Socioeconomic History  . Marital status: Legally Separated    Spouse name: Not on file  . Number of children: Not on file  . Years of education: Not on file  . Highest education level: Not on file  Occupational History  . Not on file  Tobacco Use  . Smoking status: Never Smoker  . Smokeless tobacco: Never Used  Substance and Sexual Activity  . Alcohol use: Never  . Drug use: Never  . Sexual activity: Not on file  Other Topics Concern  . Not on file  Social History Narrative  . Not on file   Social Determinants of Health   Financial Resource Strain:   . Difficulty of Paying Living Expenses: Not on file  Food Insecurity:   . Worried About Charity fundraiser in the Last Year: Not on file  . Ran Out of Food in the Last Year: Not on file  Transportation Needs:   . Lack of Transportation (Medical): Not on file  . Lack of  Transportation (Non-Medical): Not on file  Physical Activity:   . Days of Exercise per Week: Not on file  . Minutes of Exercise per Session: Not on file  Stress:   . Feeling of Stress : Not on file  Social Connections:   . Frequency of Communication with Friends and Family: Not on file  . Frequency of Social Gatherings with Friends and Family: Not on file  . Attends Religious Services: Not on file  . Active Member of Clubs or Organizations: Not on file  . Attends Archivist Meetings: Not on file  . Marital Status: Not on file    Intimate Partner Violence:   . Fear of Current or Ex-Partner: Not on file  . Emotionally Abused: Not on file  . Physically Abused: Not on file  . Sexually Abused: Not on file     ROS- All systems are reviewed and negative except as per the HPI above.  Physical Exam: Vitals:   09/04/20 1419  BP: (!) 128/94  Pulse: 77  Weight: 132.8 kg  Height: 6\' 1"  (1.854 m)    GEN- The patient is well appearing obese male, alert and oriented x 3 today.   Head- normocephalic, atraumatic Eyes-  Sclera clear, conjunctiva pink Ears- hearing intact Oropharynx- clear Neck- supple  Lungs- Clear to ausculation bilaterally, normal work of breathing Heart- Regular rate and rhythm, no murmurs, rubs or gallops  GI- soft, NT, ND, + BS Extremities- no clubbing, cyanosis, or edema MS- no significant deformity or atrophy Skin- no rash or lesion Psych- euthymic mood, full affect Neuro- strength and sensation are intact  Wt Readings from Last 3 Encounters:  09/04/20 132.8 kg  08/17/20 133.6 kg  08/14/20 132.1 kg    EKG today demonstrates SR HR 77, RBBB, LAFB, PR 196, QRS 148, QTc 479  Epic records are reviewed at length today  CHA2DS2-VASc Score = 2  The patient's score is based upon: CHF History: 0 HTN History: 1 Diabetes History: 0 Stroke History: 0 Vascular Disease History: 1 Age Score: 0 Gender Score: 0      ASSESSMENT AND PLAN: 1. Paroxysmal Atrial Fibrillation (ICD10:  I48.0) The patient's CHA2DS2-VASc score is 2, indicating a 2.2% annual risk of stroke.   New onset afib on device. He is back in SR. General education about afib provided and questions answered. We also discussed his stroke risk and the risks and benefits of anticoagulation. ? 2/2 CAD. Discussed timing of anticoagulation with Dr Harrell Gave. Given he will likely need LHC to evaluate his exertional symptoms, will hold on anticoagulation for now. Plan to start after LHC.  Continue Toprol 25 mg daily.  2. Secondary  Hypercoagulable State (ICD10:  D68.69) The patient is at significant risk for stroke/thromboembolism based upon his CHA2DS2-VASc Score of 2.  See plans above.  3. Obesity Body mass index is 38.63 kg/m. Lifestyle modification was discussed at length including regular exercise and weight reduction.  4. CAD Patient has an extensive history of coronary disease. Exertional chest pressure concerning. He has an appt later this week to discuss [place]. Continue DAPT for now.  5. HTN Stable, no changes today.  6. PPM Followed by Dr Caryl Comes and the device clinic.   Follow up with Dr Harrell Gave as scheduled. AF clinic in 3 weeks.    [facility] [address] [place], [place] [address] [phone] 09/04/2020 2:44 PM

## 2020-09-04 NOTE — Telephone Encounter (Signed)
Spoke to pt. Appointment scheduled for 10/22 at 2:20 pm.

## 2020-09-07 ENCOUNTER — Other Ambulatory Visit: Payer: Self-pay

## 2020-09-07 ENCOUNTER — Ambulatory Visit (INDEPENDENT_AMBULATORY_CARE_PROVIDER_SITE_OTHER): Payer: 59 | Admitting: Cardiology

## 2020-09-07 ENCOUNTER — Encounter: Payer: Self-pay | Admitting: Cardiology

## 2020-09-07 VITALS — BP 121/75 | HR 65 | Ht 73.0 in | Wt 293.4 lb

## 2020-09-07 DIAGNOSIS — I10 Essential (primary) hypertension: Secondary | ICD-10-CM | POA: Diagnosis not present

## 2020-09-07 DIAGNOSIS — I25119 Atherosclerotic heart disease of native coronary artery with unspecified angina pectoris: Secondary | ICD-10-CM

## 2020-09-07 DIAGNOSIS — Z01812 Encounter for preprocedural laboratory examination: Secondary | ICD-10-CM | POA: Diagnosis not present

## 2020-09-07 DIAGNOSIS — R079 Chest pain, unspecified: Secondary | ICD-10-CM | POA: Diagnosis not present

## 2020-09-07 DIAGNOSIS — I480 Paroxysmal atrial fibrillation: Secondary | ICD-10-CM

## 2020-09-07 DIAGNOSIS — E7800 Pure hypercholesterolemia, unspecified: Secondary | ICD-10-CM

## 2020-09-07 NOTE — Patient Instructions (Signed)
Medication Instructions:  Your Physician recommend you continue on your current medication as directed.    *If you need a refill on your cardiac medications before your next appointment, please call your pharmacy*   Lab Work: Your physician recommends that you return for lab work today (CBC, BMP).   Testing/Procedures: Your physician has requested that you have a cardiac catheterization. Cardiac catheterization is used to diagnose and/or treat various heart conditions. Doctors may recommend this procedure for a number of different reasons. The most common reason is to evaluate chest pain. Chest pain can be a symptom of coronary artery disease (CAD), and cardiac catheterization can show whether plaque is narrowing or blocking your heart's arteries. This procedure is also used to evaluate the valves, as well as measure the blood flow and oxygen levels in different parts of your heart. For further information please visit [email or web address]. Please follow instruction sheet, as given. [facility]  You will need to have the coronavirus test completed prior to your procedure. An appointment has been made at 9:50 on Tuesday 09/11/20. This is a Drive Up Visit at [address], [place], [place] [address]. Please tell them that you are there for procedure testing. Stay in your car and someone will be with you shortly. Please make sure to have all other labs completed before this test because you will need to stay quarantined until your procedure.   Follow-Up: At [facility], you and your health needs are our priority.  As part of our continuing mission to provide you with exceptional heart care, we have created designated Provider Care Teams.  These Care Teams include your primary Cardiologist (physician) and Advanced Practice Providers (APPs -  Physician Assistants and Nurse Practitioners) who all work together to provide you with the care you need, when you need it.  We recommend signing  up for the patient portal called "MyChart".  Sign up information is provided on this After Visit Summary.  MyChart is used to connect with patients for Virtual Visits (Telemedicine).  Patients are able to view lab/test results, encounter notes, upcoming appointments, etc.  Non-urgent messages can be sent to your provider as well.   To learn more about what you can do with MyChart, go to [email or web address].    Your next appointment:   2-3 week(s)  The format for your next appointment:   In Person  Provider:   Buford Dresser, MD     [place] [address] Dept: [phone] Loc: [place]  09/07/2020  You are scheduled for a Cardiac Catheterization on Friday, October 29 with Dr. Shelva Majestic.  1. Please arrive at the [facility] (Main Entrance A) at [facility]: [address] [place], [place] [address] at 5:30 AM (This time is two hours before your procedure to ensure your preparation). Free valet parking service is available.   Special note: Every effort is made to have your procedure done on time. Please understand that emergencies sometimes delay scheduled procedures.  2. Diet: Do not eat solid foods after midnight.  The patient may have clear liquids until 5am upon the day of the procedure.  3. Labs: You will need to have blood drawn today ( CBC, BMP).  4. Medication instructions in preparation for your procedure:   Contrast Allergy: No    On the morning of your procedure, take your Effient/Prasugrel and any morning medicines NOT listed above.  You may use sips of water.  5. Plan for one night stay--bring personal belongings. 6. Bring a current list of your medications and current insurance cards. 7. You MUST have a responsible person to drive you home. 8. Someone MUST be with you the first 24 hours after you arrive home or  your discharge will be delayed. 9. Please wear clothes that are easy to get on and off and wear slip-on shoes.  Thank you for allowing [place] to care for you!   --  Invasive Cardiovascular services

## 2020-09-07 NOTE — Progress Notes (Signed)
Cardiology Office Note:    Date:  09/07/2020   ID:  Ryan Clarke, DOB [birth year], MRN [id number]  PCP:  Patient, No Pcp Per  Cardiologist:  Buford Dresser, MD  EP: Dr. Caryl Comes  CC: follow up  History of Present Illness:    Ryan Clarke is a 64 y.o. male with a hx of CAD with many stents, pacemaker who is seen for close follow up. I initially met him 08/14/20 as a referral for palpitations, but on discussion discovered extensive CAD history and angina  Cardiac history: Had all of his prior cardiac care as part of [facility] in [place]. Not available in Care Everywhere. Reports history of 29 stents. Never had CABG, last stent 06/2019. Moved to [place] in 05/2020.  Today: Was supposed to have stress test earlier this week to evaluate for ischemia. Woke up in the middle of the night with nausea and chest pressure. Got the call from Dr. Caryl Comes shortly after that he had afib based on his device report.  Feels better in the last few days. Wonders if it is afib symptoms vs. Angina. We discussed the difference between stress test and cath extensively. Discussed medical management, PCI, and potentially CABG. After shared decision making, he wishes to proceed with cath  Has always been able to have cath done radially recently. First cath was at age 75. Feels more anxious about cath now that he is older.  Denies shortness of breath at rest or with normal exertion. No PND, orthopnea, LE edema or unexpected weight gain. No syncope or palpitations.  Past Medical History:  Diagnosis Date  . Coronary artery disease   . Hypertension     Past Surgical History:  Procedure Laterality Date  . CORONARY STENT PLACEMENT    . PACEMAKER INSERTION      Current Medications: Current Outpatient Medications on File Prior to Visit  Medication Sig  . amLODipine (NORVASC) 10 MG tablet Take 10 mg by mouth daily.  Marland Kitchen aspirin EC 81 MG tablet Take 81 mg by mouth daily. Swallow whole.  Marland Kitchen atorvastatin (LIPITOR) 80 MG  tablet Take 80 mg by mouth daily.  . metoprolol succinate (TOPROL-XL) 25 MG 24 hr tablet Take 25 mg by mouth daily.  . nitroGLYCERIN (NITROSTAT) 0.4 MG SL tablet Place 1 tablet (0.4 mg total) under the tongue every 5 (five) minutes as needed for chest pain.  . prasugrel (EFFIENT) 10 MG TABS tablet Take 10 mg by mouth daily.   No current facility-administered medications on file prior to visit.     Allergies:   Patient has no known allergies.   Social History   Tobacco Use  . Smoking status: Never Smoker  . Smokeless tobacco: Never Used  Substance Use Topics  . Alcohol use: Never  . Drug use: Never    Family History: No premature history of CAD  ROS:   Please see the history of present illness.  Additional pertinent ROS otherwise unremarkable   EKGs/Labs/Other Studies Reviewed:    The following studies were reviewed today: No prior cardiac studies available  EKG:  EKG is personally reviewed.  The ekg ordered 09/04/20 demonstrates NSR, RBBB, LAFB, prior inferior infarct  Recent Labs: 08/09/2020: B Natriuretic Peptide 28.0; BUN 8; Creatinine, Ser 0.78; Hemoglobin 17.2; Platelets 219; Potassium 3.4; Sodium 140  Recent Lipid Panel    Component Value Date/Time   CHOL 96 (L) 08/17/2020 1339   TRIG 174 (H) 08/17/2020 1339   HDL 32 (L) 08/17/2020 1339   CHOLHDL 3.0 08/17/2020  Fults 35 08/17/2020 1339   LDLDIRECT 33 08/17/2020 1339    Physical Exam:    VS:  BP 121/75   Pulse 65   Ht _0  (1.854 m)   Wt 293 lb 6.4 oz (133.1 kg)   SpO2 97%   BMI 38.71 kg/m     Wt Readings from Last 3 Encounters:  09/07/20 293 lb 6.4 oz (133.1 kg)  09/04/20 292 lb 12.8 oz (132.8 kg)  08/17/20 294 lb 9.6 oz (133.6 kg)    GEN: Well nourished, well developed in no acute distress HEENT: Normal, moist mucous membranes NECK: No JVD CARDIAC: regular rhythm, normal S1 and S2, no rubs or gallops. No murmur. VASCULAR: Radial and DP pulses 2+ bilaterally. No carotid  bruits RESPIRATORY:  Clear to auscultation without rales, wheezing or rhonchi  ABDOMEN: Soft, non-tender, non-distended MUSCULOSKELETAL:  Ambulates independently SKIN: Warm and dry, no edema NEUROLOGIC:  Alert and oriented x 3. No focal neuro deficits noted. PSYCHIATRIC:  Normal affect   ASSESSMENT:    1. Chest pain, unspecified type   2. Pre-procedure lab exam   3. Coronary artery disease involving native coronary artery of native heart with angina pectoris ([place])   4. Essential hypertension   5. Pure hypercholesterolemia   6. Paroxysmal atrial fibrillation (HCC)    PLAN:    Coronary artery disease, with reported 29 prior stents in [place] (no records) Possible stable angina vs. GI symptoms as cause of chest pain -had recurrent symptoms the night before his stress test. Seen for close follow up -we discussed options extensively today. After shared decision making, he wishes to proceed with cath -Risks and benefits of cardiac catheterization have been discussed with the patient.  These include bleeding, infection, kidney damage, stroke, heart attack, death.  The patient understands these risks and is willing to proceed. -has nitroglycerin, instructed on use -will get CBC, BMET today -instructed on red flag warning signs that need immediate medical attention -continue aspirin, prasugrel -continue atorvastatin -continue metoprolol, amlodipine. Consider imdur for antianginal based on results of cath  S/P Dual chamber [employer] pacemaker -established with device clinic  Paroxysmal atrial fibrillation: new -discussed risk with patient. Have also discussed with afib clinic -plan for now will be to have cath done, then based on findings start [place] after  Hypertension: at goal -continue amlodipine, metoprolol  Hyperlipidemia: -LDL goal <70, LDL 35 on recent lipids -continue atorvastatin -TG elevated, unclear if fasting  Cardiac risk counseling and prevention  recommendations: -recommend heart healthy/Mediterranean diet, with whole grains, fruits, vegetable, fish, lean meats, nuts, and olive oil. Limit salt. -recommend moderate walking, 3-5 times/week for 30-50 minutes each session. Aim for at least 150 minutes.week. Goal should be pace of 3 miles/hours, or walking 1.5 miles in 30 minutes -recommend avoidance of tobacco products. Avoid excess alcohol.  Plan for follow up: close follow up post cath, within 2-3 weeks  Buford Dresser, MD, PhD [place]  [facility]    Medication Adjustments/Labs and Tests Ordered: Current medicines are reviewed at length with the patient today.  Concerns regarding medicines are outlined above.  Orders Placed This Encounter  Procedures  . CBC  . Basic metabolic panel   No orders of the defined types were placed in this encounter.   Patient Instructions  Medication Instructions:  Your Physician recommend you continue on your current medication as directed.    *If you need a refill on your cardiac medications before your next appointment, please call your  pharmacy*   Lab Work: Your physician recommends that you return for lab work today (CBC, BMP).   Testing/Procedures: Your physician has requested that you have a cardiac catheterization. Cardiac catheterization is used to diagnose and/or treat various heart conditions. Doctors may recommend this procedure for a number of different reasons. The most common reason is to evaluate chest pain. Chest pain can be a symptom of coronary artery disease (CAD), and cardiac catheterization can show whether plaque is narrowing or blocking your heart's arteries. This procedure is also used to evaluate the valves, as well as measure the blood flow and oxygen levels in different parts of your heart. For further information please visit HugeFiesta.[email or web address]. Please follow instruction sheet, as given. [facility]  You will need to have the coronavirus test  completed prior to your procedure. An appointment has been made at 9:50 on Tuesday 09/11/20. This is a Drive Up Visit at [address], [place], [place] [address]. Please tell them that you are there for procedure testing. Stay in your car and someone will be with you shortly. Please make sure to have all other labs completed before this test because you will need to stay quarantined until your procedure.   Follow-Up: At [facility], you and your health needs are our priority.  As part of our continuing mission to provide you with exceptional heart care, we have created designated Provider Care Teams.  These Care Teams include your primary Cardiologist (physician) and Advanced Practice Providers (APPs -  Physician Assistants and Nurse Practitioners) who all work together to provide you with the care you need, when you need it.  We recommend signing up for the patient portal called "MyChart".  Sign up information is provided on this After Visit Summary.  MyChart is used to connect with patients for Virtual Visits (Telemedicine).  Patients are able to view lab/test results, encounter notes, upcoming appointments, etc.  Non-urgent messages can be sent to your provider as well.   To learn more about what you can do with MyChart, go to [email or web address].    Your next appointment:   2-3 week(s)  The format for your next appointment:   In Person  Provider:   Buford Dresser, MD     [place] [address] Dept: [phone] Loc: [place]  09/07/2020  You are scheduled for a Cardiac Catheterization on Friday, October 29 with Dr. Shelva Majestic.  1. Please arrive at the [facility] (Main Entrance A) at [facility]: [address] [place], [place] [address] at 5:30 AM (This time is two hours before your procedure to ensure your preparation).  Free valet parking service is available.   Special note: Every effort is made to have your procedure done on time. Please understand that emergencies sometimes delay scheduled procedures.  2. Diet: Do not eat solid foods after midnight.  The patient may have clear liquids until 5am upon the day of the procedure.  3. Labs: You will need to have blood drawn today ( CBC, BMP).  4. Medication instructions in preparation for your procedure:   Contrast Allergy: No    On the morning of your procedure, take your Effient/Prasugrel and any morning medicines NOT listed above.  You may use sips of water.  5. Plan for one night stay--bring personal belongings. 6. Bring a current list of your medications and current insurance cards. 7. You MUST have a  responsible person to drive you home. 8. Someone MUST be with you the first 24 hours after you arrive home or your discharge will be delayed. 9. Please wear clothes that are easy to get on and off and wear slip-on shoes.  Thank you for allowing [place] to care for you!   -- [place] Invasive Cardiovascular services    Signed, Buford Dresser, MD PhD 09/07/2020   [place]

## 2020-09-08 LAB — CBC
Hematocrit: 49.2 % (ref 37.5–51.0)
Hemoglobin: 16.8 g/dL (ref 13.0–17.7)
MCH: 30.2 pg (ref 26.6–33.0)
MCHC: 34.1 g/dL (ref 31.5–35.7)
MCV: 89 fL (ref 79–97)
Platelets: 201 10*3/uL (ref 150–450)
RBC: 5.56 x10E6/uL (ref 4.14–5.80)
RDW: 12.6 % (ref 11.6–15.4)
WBC: 5.8 10*3/uL (ref 3.4–10.8)

## 2020-09-08 LAB — BASIC METABOLIC PANEL
BUN/Creatinine Ratio: 12 (ref 10–24)
BUN: 9 mg/dL (ref 8–27)
CO2: 25 mmol/L (ref 20–29)
Calcium: 9.3 mg/dL (ref 8.6–10.2)
Chloride: 102 mmol/L (ref 96–106)
Creatinine, Ser: 0.74 mg/dL — ABNORMAL LOW (ref 0.76–1.27)
GFR calc Af Amer: 113 mL/min/{1.73_m2} (ref >59)
GFR calc non Af Amer: 97 mL/min/{1.73_m2} (ref >59)
Glucose: 88 mg/dL (ref 65–99)
Potassium: 4 mmol/L (ref 3.5–5.2)
Sodium: 138 mmol/L (ref 134–144)

## 2020-09-10 ENCOUNTER — Ambulatory Visit (INDEPENDENT_AMBULATORY_CARE_PROVIDER_SITE_OTHER): Payer: 59 | Admitting: Family Medicine

## 2020-09-10 ENCOUNTER — Other Ambulatory Visit: Payer: Self-pay

## 2020-09-10 ENCOUNTER — Encounter: Payer: Self-pay | Admitting: Family Medicine

## 2020-09-10 VITALS — BP 120/70 | HR 73 | Ht 73.0 in | Wt 293.8 lb

## 2020-09-10 DIAGNOSIS — Z114 Encounter for screening for human immunodeficiency virus [HIV]: Secondary | ICD-10-CM | POA: Diagnosis not present

## 2020-09-10 DIAGNOSIS — K30 Functional dyspepsia: Secondary | ICD-10-CM

## 2020-09-10 DIAGNOSIS — Z0000 Encounter for general adult medical examination without abnormal findings: Secondary | ICD-10-CM

## 2020-09-10 DIAGNOSIS — Z1211 Encounter for screening for malignant neoplasm of colon: Secondary | ICD-10-CM

## 2020-09-10 DIAGNOSIS — Z131 Encounter for screening for diabetes mellitus: Secondary | ICD-10-CM

## 2020-09-10 DIAGNOSIS — Z23 Encounter for immunization: Secondary | ICD-10-CM

## 2020-09-10 DIAGNOSIS — G5603 Carpal tunnel syndrome, bilateral upper limbs: Secondary | ICD-10-CM

## 2020-09-10 DIAGNOSIS — G5600 Carpal tunnel syndrome, unspecified upper limb: Secondary | ICD-10-CM | POA: Insufficient documentation

## 2020-09-10 DIAGNOSIS — Z1159 Encounter for screening for other viral diseases: Secondary | ICD-10-CM | POA: Diagnosis not present

## 2020-09-10 HISTORY — DX: Carpal tunnel syndrome, unspecified upper limb: G56.00

## 2020-09-10 MED ORDER — FAMOTIDINE 20 MG PO TABS: 10.0000 mg | ORAL_TABLET | Freq: Two times a day (BID) | ORAL | 0 refills | Status: DC | PRN

## 2020-09-10 NOTE — Assessment & Plan Note (Signed)
Per patient, diagnosed 3 to 4 years ago.  Has tried wrist splint without relief.  Requesting referral for carpal tunnel surgery. - f/u 1 month post cardiac cath to discuss referral for surgery

## 2020-09-10 NOTE — Progress Notes (Signed)
    SUBJECTIVE:   CHIEF COMPLAINT / HPI: Establish care  CAD History of MI in 1995 per questionnaire. Multiple stents. Has cardiac catheterization scheduled 10/29. Underwent stress test and echocardiogram in 2020 per questionnaire. Denies chest pain, SOB currently.   Indigestion Reports indigestion over the past month with eating.  Unsure if cardiac related or not.  Chronic back pain Patient has had chronic lower back pain for 10+ years.  Radiates to his right leg, more recently has noted radiation into the left leg as well. Is not taking anything for pain.  Does not like taking medication.  Carpal tunnel syndrome States this was diagnosed 3 to 4 years ago by her previous physician in [place]. Reports pain and numbness into both wrists. Has tried wrist splints without relief, not currently using. Requesting referral for carpal tunnel surgery.  HCM - Tdap 2019 per questionnaire - PNA vaccine 2019 - has never had colonoscopy  PERTINENT  PMH / PSH: CAD (MI in 1995 s/p 29 (?) stents most recently 06/2019), paroxysmal A Fib, HTN, HLD, dual chamber pacemaker, OSA, OA  OBJECTIVE:   BP 120/70   Pulse 73   Ht 6\' 1"  (1.854 m)   Wt 293 lb 12.8 oz (133.3 kg)   SpO2 96%   BMI 38.76 kg/m   General: Older male, NAD CV: RRR, no murmurs Pulm: CTAB, no wheezes or rales MSK: no tenderness to midline back or paraspinal muscles, negative SLR bilaterally  ASSESSMENT/PLAN:   Carpal tunnel syndrome Per patient, diagnosed 3 to 4 years ago.  Has tried wrist splint without relief.  Requesting referral for carpal tunnel surgery. - f/u 1 month post cardiac cath to discuss referral for surgery   Indigestion Occurs after eating.  Unclear if anginal or due to acid reflux. - trial famotidine  Chronic low back pain No pain currently.  Does not appear to be severe, does not take any medications.  Appears to be some component of nerve impingement, though SLR negative. - back exercises given -  consider PT pending cardiac cath  HCM - A1c - HIV screening - Hep C screening - GI referral placed for colonoscopy - flu shot given  [name], MD [place]

## 2020-09-10 NOTE — Patient Instructions (Addendum)
It was nice seeing you today, Ryan Clarke.  For your indigestion, I have prescribed a medication called famotidine that you can take twice a day as needed for indigestion.  See if this helps, if this does not help at all, you can stop taking it.  I have placed a referral to GI for your colonoscopy.  You can schedule this after your cath Friday.  I have ordered some screening blood work for you.  I will update you with the results.  For your carpal tunnel syndrome, I would like to see you again in 1 month after your catheterization and we can discuss surgery.  Stay well, Ryan Button, MD    Back Exercises The following exercises strengthen the muscles that help to support the trunk and back. They also help to keep the lower back flexible. Doing these exercises can help to prevent back pain or lessen existing pain.  If you have back pain or discomfort, try doing these exercises 2-3 times each day or as told by your health care provider.  As your pain improves, do them once each day, but increase the number of times that you repeat the steps for each exercise (do more repetitions).  To prevent the recurrence of back pain, continue to do these exercises once each day or as told by your health care provider. Do exercises exactly as told by your health care provider and adjust them as directed. It is normal to feel mild stretching, pulling, tightness, or discomfort as you do these exercises, but you should stop right away if you feel sudden pain or your pain gets worse. Exercises Single knee to chest Repeat these steps 3-5 times for each leg: 1. Lie on your back on a firm bed or the floor with your legs extended. 2. Bring one knee to your chest. Your other leg should stay extended and in contact with the floor. 3. Hold your knee in place by grabbing your knee or thigh with both hands and hold. 4. Pull on your knee until you feel a gentle stretch in your lower back or buttocks. 5. Hold the stretch for  10-30 seconds. 6. Slowly release and straighten your leg. Pelvic tilt Repeat these steps 5-10 times: 1. Lie on your back on a firm bed or the floor with your legs extended. 2. Bend your knees so they are pointing toward the ceiling and your feet are flat on the floor. 3. Tighten your lower abdominal muscles to press your lower back against the floor. This motion will tilt your pelvis so your tailbone points up toward the ceiling instead of pointing to your feet or the floor. 4. With gentle tension and even breathing, hold this position for 5-10 seconds. Cat-cow Repeat these steps until your lower back becomes more flexible: 1. Get into a hands-and-knees position on a firm surface. Keep your hands under your shoulders, and keep your knees under your hips. You may place padding under your knees for comfort. 2. Let your head hang down toward your chest. Contract your abdominal muscles and point your tailbone toward the floor so your lower back becomes rounded like the back of a cat. 3. Hold this position for 5 seconds. 4. Slowly lift your head, let your abdominal muscles relax and point your tailbone up toward the ceiling so your back forms a sagging arch like the back of a cow. 5. Hold this position for 5 seconds.  Press-ups Repeat these steps 5-10 times: 1. Lie on your abdomen (face-down) on  the floor. 2. Place your palms near your head, about shoulder-width apart. 3. Keeping your back as relaxed as possible and keeping your hips on the floor, slowly straighten your arms to raise the top half of your body and lift your shoulders. Do not use your back muscles to raise your upper torso. You may adjust the placement of your hands to make yourself more comfortable. 4. Hold this position for 5 seconds while you keep your back relaxed. 5. Slowly return to lying flat on the floor.  Bridges Repeat these steps 10 times: 1. Lie on your back on a firm surface. 2. Bend your knees so they are pointing  toward the ceiling and your feet are flat on the floor. Your arms should be flat at your sides, next to your body. 3. Tighten your buttocks muscles and lift your buttocks off the floor until your waist is at almost the same height as your knees. You should feel the muscles working in your buttocks and the back of your thighs. If you do not feel these muscles, slide your feet 1-2 inches farther away from your buttocks. 4. Hold this position for 3-5 seconds. 5. Slowly lower your hips to the starting position, and allow your buttocks muscles to relax completely. If this exercise is too easy, try doing it with your arms crossed over your chest. Abdominal crunches Repeat these steps 5-10 times: 1. Lie on your back on a firm bed or the floor with your legs extended. 2. Bend your knees so they are pointing toward the ceiling and your feet are flat on the floor. 3. Cross your arms over your chest. 4. Tip your chin slightly toward your chest without bending your neck. 5. Tighten your abdominal muscles and slowly raise your trunk (torso) high enough to lift your shoulder blades a tiny bit off the floor. Avoid raising your torso higher than that because it can put too much stress on your low back and does not help to strengthen your abdominal muscles. 6. Slowly return to your starting position. Back lifts Repeat these steps 5-10 times: 1. Lie on your abdomen (face-down) with your arms at your sides, and rest your forehead on the floor. 2. Tighten the muscles in your legs and your buttocks. 3. Slowly lift your chest off the floor while you keep your hips pressed to the floor. Keep the back of your head in line with the curve in your back. Your eyes should be looking at the floor. 4. Hold this position for 3-5 seconds. 5. Slowly return to your starting position. Contact a health care provider if:  Your back pain or discomfort gets much worse when you do an exercise.  Your worsening back pain or discomfort  does not lessen within 2 hours after you exercise. If you have any of these problems, stop doing these exercises right away. Do not do them again unless your health care provider says that you can. Get help right away if:  You develop sudden, severe back pain. If this happens, stop doing the exercises right away. Do not do them again unless your health care provider says that you can. This information is not intended to replace advice given to you by your health care provider. Make sure you discuss any questions you have with your health care provider. Document Revised: 03/10/2019 Document Reviewed: 08/05/2018 Elsevier Patient Education  [place].

## 2020-09-11 ENCOUNTER — Encounter: Payer: Self-pay | Admitting: Family Medicine

## 2020-09-11 ENCOUNTER — Other Ambulatory Visit (HOSPITAL_COMMUNITY)
Admission: RE | Admit: 2020-09-11 | Discharge: 2020-09-11 | Disposition: A | Discharge status: Home / Self Care | Payer: 59 | Source: Ambulatory Visit | Attending: Cardiovascular Disease | Admitting: Cardiovascular Disease

## 2020-09-11 DIAGNOSIS — Z01812 Encounter for preprocedural laboratory examination: Secondary | ICD-10-CM | POA: Insufficient documentation

## 2020-09-11 DIAGNOSIS — Z20822 Contact with and (suspected) exposure to covid-19: Secondary | ICD-10-CM | POA: Diagnosis not present

## 2020-09-11 LAB — SARS CORONAVIRUS 2 (TAT 6-24 HRS): SARS Coronavirus 2: NEGATIVE

## 2020-09-11 LAB — HIV ANTIBODY (ROUTINE TESTING W REFLEX): HIV Screen 4th Generation wRfx: NONREACTIVE

## 2020-09-11 LAB — HEMOGLOBIN A1C
Est. average glucose Bld gHb Est-mCnc: 117 mg/dL
Hgb A1c MFr Bld: 5.7 % — ABNORMAL HIGH (ref 4.8–5.6)

## 2020-09-11 LAB — HCV INTERPRETATION

## 2020-09-11 LAB — HCV AB W REFLEX TO QUANT PCR: HCV Ab: <0.1 s/co ratio (ref 0.0–0.9)

## 2020-09-11 NOTE — Telephone Encounter (Signed)
Noted  

## 2020-09-13 ENCOUNTER — Telehealth: Payer: Self-pay | Admitting: *Deleted

## 2020-09-13 NOTE — Telephone Encounter (Signed)
Pt contacted pre-catheterization scheduled at [facility] for: Friday September 14, 2020 7:30 AM Verified arrival time and place: [facility]) at: 5:30 AM   No solid food after midnight prior to cath, clear liquids until 5 AM day of procedure.   AM meds can be  taken pre-cath with sips of water including: ASA 81 mg Effient 10 mg  Confirmed patient has responsible adult to drive home post procedure and be with patient first 24 hours after arriving home: yes  You are allowed ONE visitor in the waiting room during the time you are at the hospital for your procedure. Both you and your visitor must wear a mask once you enter the hospital.       COVID-19 Pre-Screening Questions:   In the past 14 days have you had a new cough, new headache, new nasal congestion, fever (100.4 or greater) unexplained body aches, new sore throat, or sudden loss of taste or sense of smell? no  In the past 14 days have you been around anyone with known Covid 19? no  Have you been vaccinated for COVID-19? Yes, see immunization history   Reviewed procedure/mask/visitor instructions, COVID-19 questions with patient.

## 2020-09-14 ENCOUNTER — Other Ambulatory Visit: Payer: Self-pay

## 2020-09-14 ENCOUNTER — Encounter: Payer: Self-pay | Admitting: Cardiology

## 2020-09-14 ENCOUNTER — Ambulatory Visit (HOSPITAL_COMMUNITY)
Admission: RE | Admit: 2020-09-14 | Discharge: 2020-09-15 | Disposition: A | Discharge status: Home / Self Care | Payer: 59 | Attending: Cardiovascular Disease | Admitting: Cardiovascular Disease

## 2020-09-14 ENCOUNTER — Encounter (HOSPITAL_COMMUNITY)
Admission: RE | Disposition: A | Discharge status: Home / Self Care | Payer: Self-pay | Source: Home / Self Care | Attending: Cardiovascular Disease

## 2020-09-14 DIAGNOSIS — Z950 Presence of cardiac pacemaker: Secondary | ICD-10-CM | POA: Diagnosis not present

## 2020-09-14 DIAGNOSIS — E785 Hyperlipidemia, unspecified: Secondary | ICD-10-CM | POA: Diagnosis not present

## 2020-09-14 DIAGNOSIS — I480 Paroxysmal atrial fibrillation: Secondary | ICD-10-CM | POA: Diagnosis not present

## 2020-09-14 DIAGNOSIS — I2510 Atherosclerotic heart disease of native coronary artery without angina pectoris: Secondary | ICD-10-CM | POA: Diagnosis present

## 2020-09-14 DIAGNOSIS — Z955 Presence of coronary angioplasty implant and graft: Secondary | ICD-10-CM | POA: Diagnosis not present

## 2020-09-14 DIAGNOSIS — I4510 Unspecified right bundle-branch block: Secondary | ICD-10-CM | POA: Insufficient documentation

## 2020-09-14 DIAGNOSIS — D6869 Other thrombophilia: Secondary | ICD-10-CM | POA: Diagnosis not present

## 2020-09-14 DIAGNOSIS — I25110 Atherosclerotic heart disease of native coronary artery with unstable angina pectoris: Secondary | ICD-10-CM | POA: Insufficient documentation

## 2020-09-14 DIAGNOSIS — Z6838 Body mass index (BMI) 38.0-38.9, adult: Secondary | ICD-10-CM | POA: Insufficient documentation

## 2020-09-14 DIAGNOSIS — E669 Obesity, unspecified: Secondary | ICD-10-CM | POA: Diagnosis not present

## 2020-09-14 DIAGNOSIS — Z9861 Coronary angioplasty status: Secondary | ICD-10-CM

## 2020-09-14 DIAGNOSIS — I10 Essential (primary) hypertension: Secondary | ICD-10-CM | POA: Diagnosis not present

## 2020-09-14 HISTORY — PX: LEFT HEART CATH AND CORONARY ANGIOGRAPHY: CATH118249

## 2020-09-14 HISTORY — PX: CORONARY BALLOON ANGIOPLASTY: CATH118233

## 2020-09-14 LAB — POCT ACTIVATED CLOTTING TIME
Activated Clotting Time: 235 seconds
Activated Clotting Time: 279 seconds
Activated Clotting Time: 367 seconds
Activated Clotting Time: 241 seconds
Activated Clotting Time: 334 seconds
Activated Clotting Time: 417 seconds
Activated Clotting Time: 263 seconds
Activated Clotting Time: 263 seconds
Activated Clotting Time: 268 seconds

## 2020-09-14 SURGERY — LEFT HEART CATH AND CORONARY ANGIOGRAPHY
Anesthesia: LOCAL

## 2020-09-14 MED ORDER — HEPARIN SODIUM (PORCINE) 1000 UNIT/ML IJ SOLN
INTRAMUSCULAR | Status: AC
  Filled 2020-09-14: qty 1

## 2020-09-14 MED ORDER — MIDAZOLAM HCL 2 MG/2ML IJ SOLN
INTRAMUSCULAR | Status: DC | PRN
  Administered 2020-09-14 (×2): 1 mg via INTRAVENOUS
  Administered 2020-09-14: 2 mg via INTRAVENOUS
  Administered 2020-09-14: 1 mg via INTRAVENOUS

## 2020-09-14 MED ORDER — HYDRALAZINE HCL 20 MG/ML IJ SOLN: 10.0000 mg | INTRAMUSCULAR | Status: AC | PRN

## 2020-09-14 MED ORDER — ACETAMINOPHEN 325 MG PO TABS: 650.0000 mg | ORAL_TABLET | ORAL | Status: DC | PRN

## 2020-09-14 MED ORDER — MIDAZOLAM HCL 2 MG/2ML IJ SOLN
INTRAMUSCULAR | Status: AC
  Filled 2020-09-14: qty 2

## 2020-09-14 MED ORDER — SODIUM CHLORIDE 0.9% FLUSH: 3.0000 mL | INTRAVENOUS | Status: DC | PRN

## 2020-09-14 MED ORDER — ASPIRIN 81 MG PO CHEW: 81.0000 mg | CHEWABLE_TABLET | ORAL | Status: DC

## 2020-09-14 MED ORDER — SODIUM CHLORIDE 0.9 % IV SOLN: INTRAVENOUS | Status: DC

## 2020-09-14 MED ORDER — LABETALOL HCL 5 MG/ML IV SOLN: 10.0000 mg | INTRAVENOUS | Status: AC | PRN

## 2020-09-14 MED ORDER — SODIUM CHLORIDE 0.9% FLUSH
3.0000 mL | Freq: Two times a day (BID) | INTRAVENOUS | Status: DC
  Administered 2020-09-14 (×2): 3 mL via INTRAVENOUS

## 2020-09-14 MED ORDER — SODIUM CHLORIDE 0.9 % WEIGHT BASED INFUSION
1.0000 mL/kg/h | INTRAVENOUS | Status: DC
  Administered 2020-09-14: 250 mL/h via INTRAVENOUS

## 2020-09-14 MED ORDER — NITROGLYCERIN 1 MG/10 ML FOR IR/CATH LAB
INTRA_ARTERIAL | Status: AC
  Filled 2020-09-14: qty 10

## 2020-09-14 MED ORDER — HEPARIN (PORCINE) IN NACL 1000-0.9 UT/500ML-% IV SOLN
INTRAVENOUS | Status: AC
  Filled 2020-09-14: qty 1000

## 2020-09-14 MED ORDER — VERAPAMIL HCL 2.5 MG/ML IV SOLN
INTRAVENOUS | Status: DC | PRN
  Administered 2020-09-14: 10 mL via INTRA_ARTERIAL

## 2020-09-14 MED ORDER — LIDOCAINE HCL (PF) 1 % IJ SOLN
INTRAMUSCULAR | Status: AC
  Filled 2020-09-14: qty 30

## 2020-09-14 MED ORDER — DIAZEPAM 5 MG PO TABS: 5.0000 mg | ORAL_TABLET | Freq: Four times a day (QID) | ORAL | Status: DC | PRN

## 2020-09-14 MED ORDER — ATORVASTATIN CALCIUM 80 MG PO TABS
80.0000 mg | ORAL_TABLET | Freq: Every day | ORAL | Status: DC
  Administered 2020-09-14: 80 mg via ORAL
  Filled 2020-09-14: qty 1

## 2020-09-14 MED ORDER — VERAPAMIL HCL 2.5 MG/ML IV SOLN
INTRAVENOUS | Status: AC
  Filled 2020-09-14: qty 2

## 2020-09-14 MED ORDER — IOHEXOL 350 MG/ML SOLN
INTRAVENOUS | Status: DC | PRN
  Administered 2020-09-14: 220 mL

## 2020-09-14 MED ORDER — LIDOCAINE HCL (PF) 1 % IJ SOLN
INTRAMUSCULAR | Status: DC | PRN
  Administered 2020-09-14: 2 mL

## 2020-09-14 MED ORDER — SODIUM CHLORIDE 0.9 % IV SOLN: 250.0000 mL | INTRAVENOUS | Status: DC | PRN

## 2020-09-14 MED ORDER — PRASUGREL HCL 10 MG PO TABS
10.0000 mg | ORAL_TABLET | Freq: Every day | ORAL | Status: DC
  Administered 2020-09-15: 10 mg via ORAL
  Filled 2020-09-14 (×2): qty 1

## 2020-09-14 MED ORDER — FENTANYL CITRATE (PF) 100 MCG/2ML IJ SOLN
INTRAMUSCULAR | Status: AC
  Filled 2020-09-14: qty 2

## 2020-09-14 MED ORDER — HEPARIN SODIUM (PORCINE) 1000 UNIT/ML IJ SOLN
INTRAMUSCULAR | Status: DC | PRN
  Administered 2020-09-14 (×2): 2000 [IU] via INTRAVENOUS
  Administered 2020-09-14: 3500 [IU] via INTRAVENOUS
  Administered 2020-09-14: 8000 [IU] via INTRAVENOUS
  Administered 2020-09-14: 6500 [IU] via INTRAVENOUS
  Administered 2020-09-14: 2000 [IU] via INTRAVENOUS
  Administered 2020-09-14: 3000 [IU] via INTRAVENOUS

## 2020-09-14 MED ORDER — HEPARIN (PORCINE) IN NACL 1000-0.9 UT/500ML-% IV SOLN
INTRAVENOUS | Status: DC | PRN
  Administered 2020-09-14 (×3): 500 mL

## 2020-09-14 MED ORDER — ONDANSETRON HCL 4 MG/2ML IJ SOLN
INTRAMUSCULAR | Status: AC
  Filled 2020-09-14: qty 2

## 2020-09-14 MED ORDER — ONDANSETRON HCL 4 MG/2ML IJ SOLN: 4.0000 mg | Freq: Four times a day (QID) | INTRAMUSCULAR | Status: DC | PRN

## 2020-09-14 MED ORDER — SODIUM CHLORIDE 0.9 % WEIGHT BASED INFUSION
3.0000 mL/kg/h | INTRAVENOUS | Status: DC
  Administered 2020-09-14: 3 mL/kg/h via INTRAVENOUS

## 2020-09-14 MED ORDER — FENTANYL CITRATE (PF) 100 MCG/2ML IJ SOLN
INTRAMUSCULAR | Status: DC | PRN
  Administered 2020-09-14 (×4): 25 ug via INTRAVENOUS

## 2020-09-14 MED ORDER — ONDANSETRON HCL 4 MG/2ML IJ SOLN
INTRAMUSCULAR | Status: DC | PRN
  Administered 2020-09-14: 4 mg via INTRAVENOUS

## 2020-09-14 MED ORDER — ASPIRIN 81 MG PO CHEW
81.0000 mg | CHEWABLE_TABLET | Freq: Every day | ORAL | Status: DC
  Administered 2020-09-15: 81 mg via ORAL
  Filled 2020-09-14: qty 1

## 2020-09-14 MED ORDER — NITROGLYCERIN 1 MG/10 ML FOR IR/CATH LAB
INTRA_ARTERIAL | Status: DC | PRN
  Administered 2020-09-14: 200 ug via INTRACORONARY
  Administered 2020-09-14: 100 ug via INTRACORONARY
  Administered 2020-09-14 (×2): 200 ug via INTRACORONARY
  Administered 2020-09-14: 100 ug via INTRACORONARY
  Administered 2020-09-14: 200 ug via INTRACORONARY

## 2020-09-14 MED ORDER — SODIUM CHLORIDE 0.9% FLUSH: 3.0000 mL | Freq: Two times a day (BID) | INTRAVENOUS | Status: DC

## 2020-09-14 SURGICAL SUPPLY — 30 items
BALLN EMERGE MR 2.25X12 (BALLOONS) ×2
BALLN SAPPHIRE 2.0X12 (BALLOONS) ×2
BALLN SAPPHIRE 2.5X10 (BALLOONS) ×2
BALLN SAPPHIRE 2.5X12 (BALLOONS) ×4
BALLN SAPPHIRE 3.0X10 (BALLOONS) ×2
BALLN SAPPHIRE ~~LOC~~ 2.5X12 (BALLOONS) ×2 IMPLANT
BALLN SAPPHIRE ~~LOC~~ 2.75X10 (BALLOONS) ×2 IMPLANT
BALLN SAPPHIRE ~~LOC~~ 3.0X10 (BALLOONS) ×2 IMPLANT
BALLN WOLVERINE 2.75X10 (BALLOONS) ×2
BALLOON EMERGE MR 2.25X12 (BALLOONS) ×1 IMPLANT
BALLOON SAPPHIRE 2.0X12 (BALLOONS) ×1 IMPLANT
BALLOON SAPPHIRE 2.5X10 (BALLOONS) ×1 IMPLANT
BALLOON SAPPHIRE 2.5X12 (BALLOONS) ×2 IMPLANT
BALLOON SAPPHIRE 3.0X10 (BALLOONS) ×1 IMPLANT
BALLOON WOLVERINE 2.75X10 (BALLOONS) ×1 IMPLANT
CATH INFINITI JR4 5F (CATHETERS) ×2 IMPLANT
CATH OPTITORQUE TIG 4.0 5F (CATHETERS) ×2 IMPLANT
CATH VISTA GUIDE 6FR XB3.5 (CATHETERS) ×2 IMPLANT
DEVICE RAD COMP TR BAND LRG (VASCULAR PRODUCTS) ×2 IMPLANT
GLIDESHEATH SLEND SS 6F .021 (SHEATH) ×2 IMPLANT
GUIDEWIRE INQWIRE 1.5J.035X260 (WIRE) ×1 IMPLANT
INQWIRE 1.5J .035X260CM (WIRE) ×2
KIT ENCORE 26 ADVANTAGE (KITS) ×8 IMPLANT
KIT HEART LEFT (KITS) ×2 IMPLANT
PACK CARDIAC CATHETERIZATION (CUSTOM PROCEDURE TRAY) ×2 IMPLANT
SHEATH PROBE COVER 6X72 (BAG) ×2 IMPLANT
TRANSDUCER W/STOPCOCK (MISCELLANEOUS) ×2 IMPLANT
TUBING CIL FLEX 10 FLL-RA (TUBING) ×2 IMPLANT
WIRE COUGAR XT STRL 190CM (WIRE) ×4 IMPLANT
WIRE HI TORQ WHISPER MS 190CM (WIRE) ×2 IMPLANT

## 2020-09-14 NOTE — Interval H&P Note (Signed)
Cath Lab Visit (complete for each Cath Lab visit)  Clinical Evaluation Leading to the Procedure:   ACS: No.  Non-ACS:    Anginal Classification: CCS III  Anti-ischemic medical therapy: Maximal Therapy (2 or more classes of medications)  Non-Invasive Test Results: No non-invasive testing performed  Prior CABG: No previous CABG      History and Physical Interval Note:  09/14/2020 7:45 AM  Ryan Clarke  has presented today for surgery, with the diagnosis of Chest pain.  The various methods of treatment have been discussed with the patient and family. After consideration of risks, benefits and other options for treatment, the patient has consented to  Procedure(s): LEFT HEART CATH AND CORONARY ANGIOGRAPHY (N/A) as a surgical intervention.  The patient's history has been reviewed, patient examined, no change in status, stable for surgery.  I have reviewed the patient's chart and labs.  Questions were answered to the patient's satisfaction.     Shelva Majestic

## 2020-09-15 ENCOUNTER — Other Ambulatory Visit: Payer: Self-pay

## 2020-09-15 DIAGNOSIS — I10 Essential (primary) hypertension: Secondary | ICD-10-CM | POA: Diagnosis not present

## 2020-09-15 DIAGNOSIS — I480 Paroxysmal atrial fibrillation: Secondary | ICD-10-CM | POA: Diagnosis not present

## 2020-09-15 DIAGNOSIS — I2510 Atherosclerotic heart disease of native coronary artery without angina pectoris: Secondary | ICD-10-CM

## 2020-09-15 DIAGNOSIS — I25110 Atherosclerotic heart disease of native coronary artery with unstable angina pectoris: Secondary | ICD-10-CM | POA: Diagnosis not present

## 2020-09-15 DIAGNOSIS — Z955 Presence of coronary angioplasty implant and graft: Secondary | ICD-10-CM | POA: Diagnosis not present

## 2020-09-15 LAB — BASIC METABOLIC PANEL
Anion gap: 6 (ref 5–15)
BUN: 10 mg/dL (ref 8–23)
CO2: 30 mmol/L (ref 22–32)
Calcium: 8.7 mg/dL — ABNORMAL LOW (ref 8.9–10.3)
Chloride: 104 mmol/L (ref 98–111)
Creatinine, Ser: 0.87 mg/dL (ref 0.61–1.24)
GFR, Estimated: >60 mL/min (ref >60)
Glucose, Bld: 98 mg/dL (ref 70–99)
Potassium: 3.9 mmol/L (ref 3.5–5.1)
Sodium: 140 mmol/L (ref 135–145)

## 2020-09-15 LAB — CBC
HCT: 43.2 % (ref 39.0–52.0)
Hemoglobin: 14.1 g/dL (ref 13.0–17.0)
MCH: 30 pg (ref 26.0–34.0)
MCHC: 32.6 g/dL (ref 30.0–36.0)
MCV: 91.9 fL (ref 80.0–100.0)
Platelets: 168 10*3/uL (ref 150–400)
RBC: 4.7 MIL/uL (ref 4.22–5.81)
RDW: 12.4 % (ref 11.5–15.5)
WBC: 5.4 10*3/uL (ref 4.0–10.5)
nRBC: 0 % (ref 0.0–0.2)

## 2020-09-15 NOTE — Discharge Instructions (Signed)

## 2020-09-15 NOTE — Progress Notes (Signed)
CARDIAC REHAB PHASE I   PRE:  Rate/Rhythm: SB/53  BP:  Sitting: 122/75      SaO2: 95  MODE:  Ambulation: 470 ft   POST:  Rate/Rhythm: 51/SB  BP:  Sitting: 139/82      SaO2: 95  08:00 - 08:56  Pt received in bed. Agrees to ambulate. Pt walked with steady gait. No c/o SOB, dizziness or angina with walk. Reviewed PTCA procedure with patient and stressed the use of Effient/Asa post procedure. Stressed medication compliance and f/u with MD. Reviewed activity restrictions, wound care and s/s to report to MD. Reviewed risk factors; HTN, Cholesterol, physical activity, obesity (weight loss) and heart healthy diet. Reviewed exercise guidelines post PTCA. Reviewed NTG use. Pt referred to [facility] CRP2 program. Pt verbalized understnading of education.  Lesly Rubenstein, MS, ACSM EP-C, [facility] 09/15/2020  8:47 AM

## 2020-09-15 NOTE — Discharge Summary (Addendum)
Discharge Summary    Patient ID: Ryan Clarke MRN: [id number]; DOB: 05/19/1956  Admit date: 09/14/2020 Discharge date: 09/15/2020  Primary Care Provider: [name], MD  Primary Cardiologist: Buford Dresser, MD  Primary Electrophysiologist:  None   Discharge Diagnoses    Active Problems:   CAD (coronary artery disease), native coronary artery   Allergies No Known Allergies  Diagnostic Studies/Procedures    CARDIAC CATH: 09/14/2020  Previously placed RPAV stent (unknown type) is widely patent.  Ost RCA to Dist RCA lesion is 5% stenosed.  Previously placed Prox LAD to Dist LAD stent (unknown type) is widely patent.  Ost LAD lesion is 50% stenosed.  Prox Cx to Mid Cx lesion is 99% stenosed.  1st Mrg lesion is 30% stenosed.  Post intervention, there is a 30% residual stenosis.  Post intervention, there is a 20% residual stenosis.  The left ventricular systolic function is normal.  LV end diastolic pressure is normal.   Multivessel diffuse stenting with multiple stents inserted from the ostium of the LAD to the distal third of the LAD with focal 50% ostial narrowing; stenting of the proximal to mid circumflex vessel with bifurcation stenting of the OM vessel with 99% stenosis with thrombus in the circumflex stent immediately after the takeoff of the stented OM vessel with TIMI 0-I flow down the distal circumflex; and diffuse stenting of the entire dominant RCA extending from the ostium to the PDA without significant restenosis.  Preserved global LV function  Very difficult intervention with initial plaque shifting into the OM vessel requiring ultimate PTCA of both the OM ostium as well as the circumflex stent with Cutting Balloon intervention to the circumflex stent due to persistent intimal hyperplasia not resolved with noncompliant balloon dilatation.  Ultimate kissing balloon technique to the OM and circumflex with a residual narrowing in the OM at 20% and  in the circumflex at  30% with resultant brisk TIMI-3 flow to the distal circumflex vessel supplying several additional vessels.  RECOMMENDATION: Long-term DAPT indefinitely due to the extensive stenting in this patient who previously had 29 stents placed in all of his major coronary arteries.  Aggressive lipid-lowering therapy with target LDL in the 50s or below with optimal blood pressure control. Intervention    _____________   History of Present Illness     Ryan Clarke is a 64 y.o. male with a history of CAD with multiple stents placed, reportedly 42.  Last stenting 06/2019, all done in [place] state. S/p STJ PPM, HTN, HLD.  He was seen by Dr. Harrell Gave on 10/22, and he had woken in the night with nausea and chest pressure.  He also had developed atrial fibrillation.  Cardiac catheterization was scheduled and he came to the hospital for the procedure on 09/14/2020.  Hospital Course     Consultants: None  Cardiac catheterization results are above.  He had a difficult intervention, with Cutting Balloon PTCA and kissing balloon to the OM and CFX.  He tolerated the procedure well.  It is recommended that he continue DAPT for life.  On 10/30, he was seen by Dr. Marlou Porch and all data were reviewed.  He ambulated with cardiac rehab and had no symptoms with this.  He is to continue high intensity statin.    No atrial fibrillation was seen, he is in sinus bradycardia currently.  Because he just had an intervention, Dr. Marlou Porch felt we should hold off on new start of a DOAC, defer that decision to outpatient with demonstrated stability.  Systolic blood pressure in the 80s at times, hold the amlodipine until seen in follow-up.  He is to follow-up as an outpatient, has an appointment with Dr. Harrell Gave scheduled.  He is stable for discharge.  Did the patient have an acute coronary syndrome (MI, NSTEMI, STEMI, etc) this admission?:  No                               Did the patient have a  percutaneous coronary intervention (stent / angioplasty)?:  Yes.     Cath/PCI Registry Performance & Quality Measures: 1. Aspirin prescribed? - Yes 2. ADP Receptor Inhibitor (Plavix/Clopidogrel, Brilinta/Ticagrelor or Effient/Prasugrel) prescribed (includes medically managed patients)? - Yes 3. High Intensity Statin (Lipitor 40-80mg  or Crestor 20-40mg ) prescribed? - Yes 4. For EF <40%, was ACEI/ARB prescribed? - Not Applicable (EF >/= 09%) 5. For EF <40%, Aldosterone Antagonist (Spironolactone or Eplerenone) prescribed? - Not Applicable (EF >/= 62%) 6. Cardiac Rehab Phase II ordered? - Yes   _____________  Discharge Vitals Blood pressure 105/68, pulse 62, temperature 97.6 F (36.4 C), temperature source Oral, resp. rate 20, height 6\' 1"  (1.854 m), weight 132.9 kg, SpO2 90 %.  Filed Weights   09/14/20 0550  Weight: 132.9 kg    Labs & Radiologic Studies    CBC Recent Labs    09/15/20 0442  WBC 5.4  HGB 14.1  HCT 43.2  MCV 91.9  PLT 836   Basic Metabolic Panel Recent Labs    09/15/20 0442  NA 140  K 3.9  CL 104  CO2 30  GLUCOSE 98  BUN 10  CREATININE 0.87  CALCIUM 8.7*   Liver Function Tests No results for input(s): AST, ALT, ALKPHOS, BILITOT, PROT, ALBUMIN in the last 72 hours. No results for input(s): LIPASE, AMYLASE in the last 72 hours. High Sensitivity Troponin:   No results for input(s): TROPONINIHS in the last 720 hours.  BNP Invalid input(s): POCBNP D-Dimer No results for input(s): DDIMER in the last 72 hours. Hemoglobin A1C No results for input(s): HGBA1C in the last 72 hours. Fasting Lipid Panel No results for input(s): CHOL, HDL, LDLCALC, TRIG, CHOLHDL, LDLDIRECT in the last 72 hours. Thyroid Function Tests No results for input(s): TSH, T4TOTAL, T3FREE, THYROIDAB in the last 72 hours.  Invalid input(s): FREET3 _____________  CARDIAC CATHETERIZATION  Result Date: 09/14/2020  Previously placed RPAV stent (unknown type) is widely patent.  Ost  RCA to Dist RCA lesion is 5% stenosed.  Previously placed Prox LAD to Dist LAD stent (unknown type) is widely patent.  Ost LAD lesion is 50% stenosed.  Prox Cx to Mid Cx lesion is 99% stenosed.  1st Mrg lesion is 30% stenosed.  Post intervention, there is a 30% residual stenosis.  Post intervention, there is a 20% residual stenosis.  The left ventricular systolic function is normal.  LV end diastolic pressure is normal.  Multivessel diffuse stenting with multiple stents inserted from the ostium of the LAD to the distal third of the LAD with focal 50% ostial narrowing; stenting of the proximal to mid circumflex vessel with bifurcation stenting of the OM vessel with 99% stenosis with thrombus in the circumflex stent immediately after the takeoff of the stented OM vessel with TIMI 0-I flow down the distal circumflex; and diffuse stenting of the entire dominant RCA extending from the ostium to the PDA without significant restenosis. Preserved global LV function Very difficult intervention with initial plaque shifting into the OM  vessel requiring ultimate PTCA of both the OM ostium as well as the circumflex stent with Cutting Balloon intervention to the circumflex stent due to persistent intimal hyperplasia not resolved with noncompliant balloon dilatation.  Ultimate kissing balloon technique to the OM and circumflex with a residual narrowing in the OM at 20% and in the circumflex at  30% with resultant brisk TIMI-3 flow to the distal circumflex vessel supplying several additional vessels. RECOMMENDATION: Long-term DAPT indefinitely due to the extensive stenting in this patient who previously had 29 stents placed in all of his major coronary arteries.  Aggressive lipid-lowering therapy with target LDL in the 50s or below with optimal blood pressure control.   Disposition   Pt is being discharged home today in improved condition.  Follow-up Plans & Appointments     Discharge Instructions    Amb Referral  to Cardiac Rehabilitation   Complete by: As directed    Diagnosis: PTCA   After initial evaluation and assessments completed: Virtual Based Care may be provided alone or in conjunction with Phase 2 Cardiac Rehab based on patient barriers.: Yes   Diet - low sodium heart healthy   Complete by: As directed    Increase activity slowly   Complete by: As directed       Discharge Medications   Allergies as of 09/15/2020   No Known Allergies     Medication List    STOP taking these medications   amLODipine 10 MG tablet Commonly known as: NORVASC     TAKE these medications   aspirin EC 81 MG tablet Take 81 mg by mouth daily. Swallow whole.   atorvastatin 80 MG tablet Commonly known as: LIPITOR Take 80 mg by mouth at bedtime.   famotidine 20 MG tablet Commonly known as: PEPCID Take 0.5 tablets (10 mg total) by mouth 2 (two) times daily as needed for heartburn or indigestion.   metoprolol succinate 25 MG 24 hr tablet Commonly known as: TOPROL-XL Take 25 mg by mouth daily.   nitroGLYCERIN 0.4 MG SL tablet Commonly known as: NITROSTAT Place 1 tablet (0.4 mg total) under the tongue every 5 (five) minutes as needed for chest pain.   prasugrel 10 MG Tabs tablet Commonly known as: EFFIENT Take 10 mg by mouth daily.          Outstanding Labs/Studies   None  Duration of Discharge Encounter   Greater than 30 minutes including physician time.  Signed, Rosaria Ferries, PA-C 09/15/2020, 11:38 AM   Personally seen and examined. Agree with above.   [facility] Cardiologist: Buford Dresser, MD   Subjective   Feels well this morning.  Worried about potential in-stent restenosis.  Inpatient Medications    Scheduled Meds: . aspirin  81 mg Oral Daily  . atorvastatin  80 mg Oral Daily  . prasugrel  10 mg Oral Daily  . sodium chloride flush  3 mL Intravenous Q12H   Continuous Infusions: . sodium chloride 150 mL/hr at 09/14/20 1615  . sodium chloride       PRN Meds: sodium chloride, acetaminophen, diazepam, ondansetron (ZOFRAN) IV, sodium chloride flush   Vital Signs          Vitals:   09/14/20 1530 09/14/20 1655 09/14/20 2141 09/15/20 0423  BP: (!) 86/61 120/79 109/71 105/68  Pulse: (!) 58 65 (!) 54 62  Resp:  20  20  Temp:  97.6 F (36.4 C) 98.7 F (37.1 C) 97.6 F (36.4 C)  TempSrc:  Oral Oral Oral  SpO2: Marland Kitchen)  89% 90% (!) 89% 90%  Weight:      Height:        Intake/Output Summary (Last 24 hours) at 09/15/2020 1010 Last data filed at 09/14/2020 1400    Gross per 24 hour  Intake 1789.53 ml  Output --  Net 1789.53 ml   Last 3 Weights 09/14/2020 09/10/2020 09/07/2020  Weight (lbs) 293 lb 293 lb 12.8 oz 293 lb 6.4 oz  Weight (kg) 132.904 kg 133.267 kg 133.085 kg      Telemetry    Sinus rhythm- Personally Reviewed  ECG    Sinus rhythm- Personally Reviewed  Physical Exam   GEN:No acute distress.   Neck:No JVD Cardiac:RRR, no murmurs, rubs, or gallops.  Cath site with minor bruising right radial otherwise excellent perfusion. Respiratory:Clear to auscultation bilaterally. PO:EUMP, nontender, non-distended  MS:No edema; No deformity. Neuro:Nonfocal  Psych: Normal affect   Labs    High Sensitivity Troponin:   Last Labs   No results for input(s): TROPONINIHS in the last 720 hours.      Chemistry Last Labs      Recent Labs  Lab 09/15/20 0442  NA 140  K 3.9  CL 104  CO2 30  GLUCOSE 98  BUN 10  CREATININE 0.87  CALCIUM 8.7*  GFRNONAA >60  ANIONGAP 6       Hematology Last Labs   Recent Labs  Lab 09/15/20 0442  WBC 5.4  RBC 4.70  HGB 14.1  HCT 43.2  MCV 91.9  MCH 30.0  MCHC 32.6  RDW 12.4  PLT 168      BNP Last Labs   No results for input(s): BNP, PROBNP in the last 168 hours.     DDimer  Last Labs   No results for input(s): DDIMER in the last 168 hours.     Radiology     Imaging Results (Last 48 hours)  CARDIAC  CATHETERIZATION  Result Date: 09/14/2020  Previously placed RPAV stent (unknown type) is widely patent.  Ost RCA to Dist RCA lesion is 5% stenosed.  Previously placed Prox LAD to Dist LAD stent (unknown type) is widely patent.  Ost LAD lesion is 50% stenosed.  Prox Cx to Mid Cx lesion is 99% stenosed.  1st Mrg lesion is 30% stenosed.  Post intervention, there is a 30% residual stenosis.  Post intervention, there is a 20% residual stenosis.  The left ventricular systolic function is normal.  LV end diastolic pressure is normal.  Multivessel diffuse stenting with multiple stents inserted from the ostium of the LAD to the distal third of the LAD with focal 50% ostial narrowing; stenting of the proximal to mid circumflex vessel with bifurcation stenting of the OM vessel with 99% stenosis with thrombus in the circumflex stent immediately after the takeoff of the stented OM vessel with TIMI 0-I flow down the distal circumflex; and diffuse stenting of the entire dominant RCA extending from the ostium to the PDA without significant restenosis. Preserved global LV function Very difficult intervention with initial plaque shifting into the OM vessel requiring ultimate PTCA of both the OM ostium as well as the circumflex stent with Cutting Balloon intervention to the circumflex stent due to persistent intimal hyperplasia not resolved with noncompliant balloon dilatation.  Ultimate kissing balloon technique to the OM and circumflex with a residual narrowing in the OM at 20% and in the circumflex at  30% with resultant brisk TIMI-3 flow to the distal circumflex vessel supplying several additional vessels. RECOMMENDATION: Long-term DAPT indefinitely due  to the extensive stenting in this patient who previously had 29 stents placed in all of his major coronary arteries.  Aggressive lipid-lowering therapy with target LDL in the 50s or below with optimal blood pressure control.     Cardiac Studies    Previously  placed RPAV stent (unknown type) is widely patent.  Ost RCA to Dist RCA lesion is 5% stenosed.  Previously placed Prox LAD to Dist LAD stent (unknown type) is widely patent.  Ost LAD lesion is 50% stenosed.  Prox Cx to Mid Cx lesion is 99% stenosed.  1st Mrg lesion is 30% stenosed.  Post intervention, there is a 30% residual stenosis.  Post intervention, there is a 20% residual stenosis.  The left ventricular systolic function is normal.  LV end diastolic pressure is normal.  Multivessel diffuse stenting with multiple stents inserted from the ostium of the LAD to the distal third of the LAD with focal 50% ostial narrowing; stenting of the proximal to mid circumflex vessel with bifurcation stenting of the OM vessel with 99% stenosis with thrombus in the circumflex stent immediately after the takeoff of the stented OM vessel with TIMI 0-I flow down the distal circumflex; and diffuse stenting of the entire dominant RCA extending from the ostium to the PDA without significant restenosis.  Preserved global LV function  Very difficult intervention with initial plaque shifting into the OM vessel requiring ultimate PTCA of both the OM ostium as well as the circumflex stent with Cutting Balloon intervention to the circumflex stent due to persistent intimal hyperplasia not resolved with noncompliant balloon dilatation. Ultimate kissing balloon technique to the OM and circumflex with a residual narrowing in the OM at 20% and in the circumflex at 30% with resultant brisk TIMI-3 flow to the distal circumflex vessel supplying several additional vessels.  RECOMMENDATION: Long-term DAPT indefinitely due to the extensive stenting in this patient who previously had 29 stents placed in all of his major coronary arteries. Aggressive lipid-lowering therapy with target LDL in the 50s or below with optimal blood pressure control.  Diagnostic Dominance: Right  Intervention     Patient Profile      64 y.o. male with greater than 20 cardiac stents, pacemaker, palpitations.  Had heart catheterization done yesterday by Dr. Claiborne Billings.  As above.  Assessment & Plan    Multivessel coronary artery disease with over 27 stents placed -Cardiac catheterization as above.  Successful angioplasty. -Continue with his current dual antiplatelet therapy with aspirin 81 mg and prasugrel or Effient 10 mg a day.  Also on atorvastatin 80 mg a day high intensity dose. -Okay for discharge.  Continue with goal-directed therapy. -Follow-up in 2 to 4 weeks with Dr. Mikki Santee pacemaker -Establishing with device clinic.  Saint Jude.  Paroxysmal atrial fibrillation -Since he just had heart catheterization, arteriotomy, we will hold off on new start [place] until clinic follow-up and demonstrated stability.  Right bundle branch block -Currently stable with sinus bradycardia.  No atrial fibrillation.  Hypertension -Goal.  Hyperlipidemia -LDL 35 on recent labs.  Continue with atorvastatin.  Okay for discharge.  Okay to resume amlodipine and metoprolol as outpatient.  For questions or updates, please contact [place] Please consult [email or web address] for contact info under        Signed, Candee Furbish, MD

## 2020-09-15 NOTE — Progress Notes (Signed)
Progress Note  Patient Name: Ryan Clarke Date of Encounter: 09/15/2020  [facility] Cardiologist: Buford Dresser, MD   Subjective   Feels well this morning.  Worried about potential in-stent restenosis.  Inpatient Medications    Scheduled Meds: . aspirin  81 mg Oral Daily  . atorvastatin  80 mg Oral Daily  . prasugrel  10 mg Oral Daily  . sodium chloride flush  3 mL Intravenous Q12H   Continuous Infusions: . sodium chloride 150 mL/hr at 09/14/20 1615  . sodium chloride     PRN Meds: sodium chloride, acetaminophen, diazepam, ondansetron (ZOFRAN) IV, sodium chloride flush   Vital Signs    Vitals:   09/14/20 1530 09/14/20 1655 09/14/20 2141 09/15/20 0423  BP: (!) 86/61 120/79 109/71 105/68  Pulse: (!) 58 65 (!) 54 62  Resp:  20  20  Temp:  97.6 F (36.4 C) 98.7 F (37.1 C) 97.6 F (36.4 C)  TempSrc:  Oral Oral Oral  SpO2: (!) 89% 90% (!) 89% 90%  Weight:      Height:        Intake/Output Summary (Last 24 hours) at 09/15/2020 1010 Last data filed at 09/14/2020 1400 Gross per 24 hour  Intake 1789.53 ml  Output --  Net 1789.53 ml   Last 3 Weights 09/14/2020 09/10/2020 09/07/2020  Weight (lbs) 293 lb 293 lb 12.8 oz 293 lb 6.4 oz  Weight (kg) 132.904 kg 133.267 kg 133.085 kg      Telemetry    Sinus rhythm- Personally Reviewed  ECG    Sinus rhythm- Personally Reviewed  Physical Exam   GEN: No acute distress.   Neck: No JVD Cardiac: RRR, no murmurs, rubs, or gallops.  Cath site with minor bruising right radial otherwise excellent perfusion. Respiratory: Clear to auscultation bilaterally. GI: Soft, nontender, non-distended  MS: No edema; No deformity. Neuro:  Nonfocal  Psych: Normal affect   Labs    High Sensitivity Troponin:  No results for input(s): TROPONINIHS in the last 720 hours.    Chemistry Recent Labs  Lab 09/15/20 0442  NA 140  K 3.9  CL 104  CO2 30  GLUCOSE 98  BUN 10  CREATININE 0.87  CALCIUM 8.7*  GFRNONAA >60   ANIONGAP 6     Hematology Recent Labs  Lab 09/15/20 0442  WBC 5.4  RBC 4.70  HGB 14.1  HCT 43.2  MCV 91.9  MCH 30.0  MCHC 32.6  RDW 12.4  PLT 168    BNPNo results for input(s): BNP, PROBNP in the last 168 hours.   DDimer No results for input(s): DDIMER in the last 168 hours.   Radiology    CARDIAC CATHETERIZATION  Result Date: 09/14/2020  Previously placed RPAV stent (unknown type) is widely patent.  Ost RCA to Dist RCA lesion is 5% stenosed.  Previously placed Prox LAD to Dist LAD stent (unknown type) is widely patent.  Ost LAD lesion is 50% stenosed.  Prox Cx to Mid Cx lesion is 99% stenosed.  1st Mrg lesion is 30% stenosed.  Post intervention, there is a 30% residual stenosis.  Post intervention, there is a 20% residual stenosis.  The left ventricular systolic function is normal.  LV end diastolic pressure is normal.  Multivessel diffuse stenting with multiple stents inserted from the ostium of the LAD to the distal third of the LAD with focal 50% ostial narrowing; stenting of the proximal to mid circumflex vessel with bifurcation stenting of the OM vessel with 99% stenosis with thrombus  in the circumflex stent immediately after the takeoff of the stented OM vessel with TIMI 0-I flow down the distal circumflex; and diffuse stenting of the entire dominant RCA extending from the ostium to the PDA without significant restenosis. Preserved global LV function Very difficult intervention with initial plaque shifting into the OM vessel requiring ultimate PTCA of both the OM ostium as well as the circumflex stent with Cutting Balloon intervention to the circumflex stent due to persistent intimal hyperplasia not resolved with noncompliant balloon dilatation.  Ultimate kissing balloon technique to the OM and circumflex with a residual narrowing in the OM at 20% and in the circumflex at  30% with resultant brisk TIMI-3 flow to the distal circumflex vessel supplying several additional  vessels. RECOMMENDATION: Long-term DAPT indefinitely due to the extensive stenting in this patient who previously had 29 stents placed in all of his major coronary arteries.  Aggressive lipid-lowering therapy with target LDL in the 50s or below with optimal blood pressure control.    Cardiac Studies    Previously placed RPAV stent (unknown type) is widely patent.  Ost RCA to Dist RCA lesion is 5% stenosed.  Previously placed Prox LAD to Dist LAD stent (unknown type) is widely patent.  Ost LAD lesion is 50% stenosed.  Prox Cx to Mid Cx lesion is 99% stenosed.  1st Mrg lesion is 30% stenosed.  Post intervention, there is a 30% residual stenosis.  Post intervention, there is a 20% residual stenosis.  The left ventricular systolic function is normal.  LV end diastolic pressure is normal.   Multivessel diffuse stenting with multiple stents inserted from the ostium of the LAD to the distal third of the LAD with focal 50% ostial narrowing; stenting of the proximal to mid circumflex vessel with bifurcation stenting of the OM vessel with 99% stenosis with thrombus in the circumflex stent immediately after the takeoff of the stented OM vessel with TIMI 0-I flow down the distal circumflex; and diffuse stenting of the entire dominant RCA extending from the ostium to the PDA without significant restenosis.  Preserved global LV function  Very difficult intervention with initial plaque shifting into the OM vessel requiring ultimate PTCA of both the OM ostium as well as the circumflex stent with Cutting Balloon intervention to the circumflex stent due to persistent intimal hyperplasia not resolved with noncompliant balloon dilatation.  Ultimate kissing balloon technique to the OM and circumflex with a residual narrowing in the OM at 20% and in the circumflex at  30% with resultant brisk TIMI-3 flow to the distal circumflex vessel supplying several additional vessels.  RECOMMENDATION: Long-term  DAPT indefinitely due to the extensive stenting in this patient who previously had 29 stents placed in all of his major coronary arteries.  Aggressive lipid-lowering therapy with target LDL in the 50s or below with optimal blood pressure control.  Diagnostic Dominance: Right  Intervention     Patient Profile     64 y.o. male with greater than 20 cardiac stents, pacemaker, palpitations.  Had heart catheterization done yesterday by Dr. Claiborne Billings.  As above.  Assessment & Plan    Multivessel coronary artery disease with over 27 stents placed -Cardiac catheterization as above.  Successful angioplasty. -Continue with his current dual antiplatelet therapy with aspirin 81 mg and prasugrel or Effient 10 mg a day.  Also on atorvastatin 80 mg a day high intensity dose. -Okay for discharge.  Continue with goal-directed therapy. -Follow-up in 2 to 4 weeks with Dr. Harrell Gave  Dual-chamber pacemaker -  Establishing with device clinic.  Saint Jude.  Paroxysmal atrial fibrillation -Since he just had heart catheterization, arteriotomy, we will hold off on new start [place] until clinic follow-up and demonstrated stability.  Right bundle branch block -Currently stable with sinus bradycardia.  No atrial fibrillation.  Hypertension -Goal.  Hyperlipidemia -LDL 35 on recent labs.  Continue with atorvastatin.  Okay for discharge.  Okay to resume amlodipine and metoprolol as outpatient.  For questions or updates, please contact [place] Please consult [email or web address] for contact info under        Signed, Candee Furbish, MD  09/15/2020, 10:10 AM

## 2020-09-17 ENCOUNTER — Encounter (HOSPITAL_COMMUNITY): Payer: Self-pay | Admitting: Cardiovascular Disease

## 2020-09-18 ENCOUNTER — Ambulatory Visit: Payer: 59 | Admitting: Cardiology

## 2020-09-21 ENCOUNTER — Telehealth (HOSPITAL_COMMUNITY): Payer: Self-pay

## 2020-09-21 NOTE — Telephone Encounter (Signed)
Pt insurance is active and benefits verified through [place] 0, DED 0/0 met, out of pocket $1,500/0 met, co-insurance 20%. no pre-authorization required. Passport, 09/21/2020@10 :Lindi Adie, REF# [id number]  Will contact patient to see if he is interested in the Cardiac Rehab Program. If interested, patient will need to complete follow up appt. Once completed, patient will be contacted for scheduling upon review by the RN Navigator.

## 2020-09-25 ENCOUNTER — Ambulatory Visit (HOSPITAL_COMMUNITY)
Admission: RE | Admit: 2020-09-25 | Discharge: 2020-09-25 | Disposition: A | Discharge status: Home / Self Care | Payer: 59 | Source: Ambulatory Visit | Attending: Physician Assistant | Admitting: Physician Assistant

## 2020-09-25 ENCOUNTER — Other Ambulatory Visit: Payer: Self-pay

## 2020-09-25 VITALS — BP 144/88 | HR 64 | Ht 73.0 in | Wt 293.4 lb

## 2020-09-25 DIAGNOSIS — E669 Obesity, unspecified: Secondary | ICD-10-CM | POA: Insufficient documentation

## 2020-09-25 DIAGNOSIS — I2510 Atherosclerotic heart disease of native coronary artery without angina pectoris: Secondary | ICD-10-CM | POA: Diagnosis not present

## 2020-09-25 DIAGNOSIS — D6869 Other thrombophilia: Secondary | ICD-10-CM | POA: Insufficient documentation

## 2020-09-25 DIAGNOSIS — Z7982 Long term (current) use of aspirin: Secondary | ICD-10-CM | POA: Diagnosis not present

## 2020-09-25 DIAGNOSIS — Z950 Presence of cardiac pacemaker: Secondary | ICD-10-CM | POA: Insufficient documentation

## 2020-09-25 DIAGNOSIS — Z6838 Body mass index (BMI) 38.0-38.9, adult: Secondary | ICD-10-CM | POA: Insufficient documentation

## 2020-09-25 DIAGNOSIS — I480 Paroxysmal atrial fibrillation: Secondary | ICD-10-CM | POA: Insufficient documentation

## 2020-09-25 DIAGNOSIS — Z955 Presence of coronary angioplasty implant and graft: Secondary | ICD-10-CM | POA: Insufficient documentation

## 2020-09-25 DIAGNOSIS — Z79899 Other long term (current) drug therapy: Secondary | ICD-10-CM | POA: Insufficient documentation

## 2020-09-25 NOTE — Progress Notes (Signed)
Primary Care Physician: [name], MD Primary Cardiologist: Dr Harrell Gave  Primary Electrophysiologist: Dr Caryl Comes Referring Physician: Device clinic/Dr Jakai Risse is a 64 y.o. male with a history of CAD s/p several stents, HTN, HLD, polycythemia, PPM, and paroxysmal atrial fibrillation who presents for consultation in the [facility]. The patient was initially diagnosed with atrial fibrillation 09/01/20 with the device clinic receiving and alert for an ongoing episode, overall rate controlled. Patient has a CHADS2VASC score of 2. He has been experiencing exertional chest discomfort over the last several weeks. He also had heart racing which corresponded to the afib detected on his device.   On follow up today, patient underwent LHC on 09/14/20 which showed multivessel diffuse stenting, LAD with focal 50% ostial narrowing, OM vessel with 99% stenosis with thrombus in the circumflex stent immediately after the takeoff of the stented OM vessel. Angioplasty performed. He reports he feels much better with no residual chest pain or SOB. He also reports he has not had any further heart racing.  Today, he denies symptoms of palpitations, chest pain, SOB, orthopnea, PND, lower extremity edema, dizziness, presyncope, syncope, snoring, daytime somnolence, bleeding, or neurologic sequela. The patient is tolerating medications without difficulties and is otherwise without complaint today.    Atrial Fibrillation Risk Factors:  he does not have symptoms or diagnosis of sleep apnea. he does not have a history of rheumatic fever.   he has a BMI of Body mass index is 38.71 kg/m.Marland Kitchen Filed Weights   09/25/20 1419  Weight: 133.1 kg    No family history on file.   Atrial Fibrillation Management history:  Previous antiarrhythmic drugs: none Previous cardioversions: none Previous ablations: none CHADS2VASC score: 2 Anticoagulation history: none   Past Medical  History:  Diagnosis Date  . Coronary artery disease   . Hypertension    Past Surgical History:  Procedure Laterality Date  . CORONARY BALLOON ANGIOPLASTY N/A 09/14/2020   Procedure: CORONARY BALLOON ANGIOPLASTY;  Surgeon: Troy Sine, MD;  Location: [facility];  Service: Cardiovascular;  Laterality: N/A;  . CORONARY STENT PLACEMENT    . LEFT HEART CATH AND CORONARY ANGIOGRAPHY N/A 09/14/2020   Procedure: LEFT HEART CATH AND CORONARY ANGIOGRAPHY;  Surgeon: Troy Sine, MD;  Location: [facility];  Service: Cardiovascular;  Laterality: N/A;  . PACEMAKER INSERTION      Current Outpatient Medications  Medication Sig Dispense Refill  . aspirin EC 81 MG tablet Take 81 mg by mouth daily. Swallow whole.    Marland Kitchen atorvastatin (LIPITOR) 80 MG tablet Take 80 mg by mouth at bedtime.     . famotidine (PEPCID) 20 MG tablet Take 0.5 tablets (10 mg total) by mouth 2 (two) times daily as needed for heartburn or indigestion. 60 tablet 0  . metoprolol succinate (TOPROL-XL) 25 MG 24 hr tablet Take 25 mg by mouth daily.    . nitroGLYCERIN (NITROSTAT) 0.4 MG SL tablet Place 1 tablet (0.4 mg total) under the tongue every 5 (five) minutes as needed for chest pain. 25 tablet 3  . prasugrel (EFFIENT) 10 MG TABS tablet Take 10 mg by mouth daily.     No current facility-administered medications for this encounter.    No Known Allergies  Social History   Socioeconomic History  . Marital status: Legally Separated    Spouse name: Not on file  . Number of children: Not on file  . Years of education: Not on file  .  Highest education level: Not on file  Occupational History  . Not on file  Tobacco Use  . Smoking status: Never Smoker  . Smokeless tobacco: Never Used  Substance and Sexual Activity  . Alcohol use: Never  . Drug use: Never  . Sexual activity: Not on file  Other Topics Concern  . Not on file  Social History Narrative  . Not on file   Social Determinants of Health    Financial Resource Strain:   . Difficulty of Paying Living Expenses: Not on file  Food Insecurity:   . Worried About Charity fundraiser in the Last Year: Not on file  . Ran Out of Food in the Last Year: Not on file  Transportation Needs:   . Lack of Transportation (Medical): Not on file  . Lack of Transportation (Non-Medical): Not on file  Physical Activity:   . Days of Exercise per Week: Not on file  . Minutes of Exercise per Session: Not on file  Stress:   . Feeling of Stress : Not on file  Social Connections:   . Frequency of Communication with Friends and Family: Not on file  . Frequency of Social Gatherings with Friends and Family: Not on file  . Attends Religious Services: Not on file  . Active Member of Clubs or Organizations: Not on file  . Attends Archivist Meetings: Not on file  . Marital Status: Not on file  Intimate Partner Violence:   . Fear of Current or Ex-Partner: Not on file  . Emotionally Abused: Not on file  . Physically Abused: Not on file  . Sexually Abused: Not on file     ROS- All systems are reviewed and negative except as per the HPI above.  Physical Exam: Vitals:   09/25/20 1419  BP: (!) 144/88  Pulse: 64  Weight: 133.1 kg  Height: 6\' 1"  (1.854 m)    GEN- The patient is well appearing obese male, alert and oriented x 3 today.   HEENT-head normocephalic, atraumatic, sclera clear, conjunctiva pink, hearing intact, trachea midline. Lungs- Clear to ausculation bilaterally, normal work of breathing Heart- Regular rate and rhythm, no murmurs, rubs or gallops  GI- soft, NT, ND, + BS Extremities- no clubbing, cyanosis, or edema MS- no significant deformity or atrophy Skin- no rash or lesion Psych- euthymic mood, full affect Neuro- strength and sensation are intact   Wt Readings from Last 3 Encounters:  09/25/20 133.1 kg  09/14/20 132.9 kg  09/10/20 133.3 kg    EKG today demonstrates SR HR 64, RBBB, PR 204, QRS 152, QTc  453  LHC 09/14/20 Multivessel diffuse stenting with multiple stents inserted from the ostium of the LAD to the distal third of the LAD with focal 50% ostial narrowing; stenting of the proximal to mid circumflex vessel with bifurcation stenting of the OM vessel with 99% stenosis with thrombus in the circumflex stent immediately after the takeoff of the stented OM vessel with TIMI 0-I flow down the distal circumflex; and diffuse stenting of the entire dominant RCA extending from the ostium to the PDA without significant restenosis.  Preserved global LV function  Very difficult intervention with initial plaque shifting into the OM vessel requiring ultimate PTCA of both the OM ostium as well as the circumflex stent with Cutting Balloon intervention to the circumflex stent due to persistent intimal hyperplasia not resolved with noncompliant balloon dilatation.  Ultimate kissing balloon technique to the OM and circumflex with a residual narrowing in the OM  at 20% and in the circumflex at  30% with resultant brisk TIMI-3 flow to the distal circumflex vessel supplying several additional vessels.   Epic records are reviewed at length today  CHA2DS2-VASc Score = 2  The patient's score is based upon: CHF History: 0 HTN History: 1 Diabetes History: 0 Stroke History: 0 Vascular Disease History: 1 Age Score: 0 Gender Score: 0      ASSESSMENT AND PLAN: 1. Paroxysmal Atrial Fibrillation (ICD10:  I48.0) The patient's CHA2DS2-VASc score is 2, indicating a 2.2% annual risk of stroke.   Patient denies any palpitations since his LHC.  Patient currently on DAPT for extensive coronary disease and recent angioplasty. Defer timing of [place] to primary cardiologist.  Continue Toprol 25 mg daily.  2. Secondary Hypercoagulable State (ICD10:  D68.69) The patient is at significant risk for stroke/thromboembolism based upon his CHA2DS2-VASc Score of 2.  See plans above.  3. Obesity Body mass index is 38.71  kg/m. Lifestyle modification was discussed and encouraged including regular physical activity and weight reduction.  4. CAD Patient has an extensive history of coronary disease. Continue DAPT for now.  5. HTN Stable, no changes today.  6. PPM Followed by Dr Caryl Comes and the device clinic.   Follow up with Dr Harrell Gave as scheduled. AF clinic in 3 months.    [facility] [address] [place], [place] [address] [phone] 09/25/2020 3:39 PM

## 2020-10-01 ENCOUNTER — Encounter: Payer: Self-pay | Admitting: Cardiology

## 2020-10-01 ENCOUNTER — Other Ambulatory Visit: Payer: Self-pay

## 2020-10-01 ENCOUNTER — Ambulatory Visit (INDEPENDENT_AMBULATORY_CARE_PROVIDER_SITE_OTHER): Payer: 59 | Admitting: Cardiology

## 2020-10-01 VITALS — BP 150/90 | HR 56 | Ht 73.0 in | Wt 295.4 lb

## 2020-10-01 DIAGNOSIS — I10 Essential (primary) hypertension: Secondary | ICD-10-CM

## 2020-10-01 DIAGNOSIS — I480 Paroxysmal atrial fibrillation: Secondary | ICD-10-CM | POA: Diagnosis not present

## 2020-10-01 DIAGNOSIS — E7800 Pure hypercholesterolemia, unspecified: Secondary | ICD-10-CM

## 2020-10-01 DIAGNOSIS — I25119 Atherosclerotic heart disease of native coronary artery with unspecified angina pectoris: Secondary | ICD-10-CM

## 2020-10-01 DIAGNOSIS — D6869 Other thrombophilia: Secondary | ICD-10-CM | POA: Diagnosis not present

## 2020-10-01 DIAGNOSIS — Z950 Presence of cardiac pacemaker: Secondary | ICD-10-CM

## 2020-10-01 NOTE — Progress Notes (Signed)
Cardiology Office Note:    Date:  10/01/2020   ID:  Ryan Clarke, DOB 04-09-1956, MRN [id number]  PCP:  [name], MD  Cardiologist:  Buford Dresser, MD  EP: Dr. Caryl Comes  CC: follow up  History of Present Illness:    Ryan Clarke is a 64 y.o. male with a hx of CAD with many stents, pacemaker who is seen for close follow up. I initially met him 08/14/20 as a referral for palpitations, but on discussion discovered extensive CAD history and angina  Cardiac history: Had all of his prior cardiac care as part of [facility] in [place]. Not available in Care Everywhere. Reports history of 29 stents. Never had CABG, last stent 06/2019. Moved to [place] in 05/2020.  Today: Two recent episodes of pallor, diaphoresis. Falls asleep after. No clear triggers, no other associated symptoms. Lasts only briefly, self resolves.  Doesn't have a home cuff. We discussed this today, how to check BP. No syncope.  Denies chest pain, shortness of breath with normal exertion. No PND, orthopnea, LE edema or unexpected weight gain. No syncope or palpitations.  Past Medical History:  Diagnosis Date  . Coronary artery disease   . Hypertension     Past Surgical History:  Procedure Laterality Date  . CORONARY BALLOON ANGIOPLASTY N/A 09/14/2020   Procedure: CORONARY BALLOON ANGIOPLASTY;  Surgeon: Troy Sine, MD;  Location: [facility];  Service: Cardiovascular;  Laterality: N/A;  . CORONARY STENT PLACEMENT    . LEFT HEART CATH AND CORONARY ANGIOGRAPHY N/A 09/14/2020   Procedure: LEFT HEART CATH AND CORONARY ANGIOGRAPHY;  Surgeon: Troy Sine, MD;  Location: [facility];  Service: Cardiovascular;  Laterality: N/A;  . PACEMAKER INSERTION      Current Medications: Current Outpatient Medications on File Prior to Visit  Medication Sig  . aspirin EC 81 MG tablet Take 81 mg by mouth daily. Swallow whole.  Marland Kitchen atorvastatin (LIPITOR) 80 MG tablet Take 80 mg by mouth at bedtime.   . famotidine  (PEPCID) 20 MG tablet Take 0.5 tablets (10 mg total) by mouth 2 (two) times daily as needed for heartburn or indigestion.  . metoprolol succinate (TOPROL-XL) 25 MG 24 hr tablet Take 25 mg by mouth daily.  . nitroGLYCERIN (NITROSTAT) 0.4 MG SL tablet Place 1 tablet (0.4 mg total) under the tongue every 5 (five) minutes as needed for chest pain.  . prasugrel (EFFIENT) 10 MG TABS tablet Take 10 mg by mouth daily.   No current facility-administered medications on file prior to visit.     Allergies:   Patient has no known allergies.   Social History   Tobacco Use  . Smoking status: Never Smoker  . Smokeless tobacco: Never Used  Substance Use Topics  . Alcohol use: Never  . Drug use: Never    Family History: No premature history of CAD  ROS:   Please see the history of present illness.  Additional pertinent ROS otherwise unremarkable   EKGs/Labs/Other Studies Reviewed:    The following studies were reviewed today: Cath 09/14/20  Previously placed RPAV stent (unknown type) is widely patent.  Ost RCA to Dist RCA lesion is 5% stenosed.  Previously placed Prox LAD to Dist LAD stent (unknown type) is widely patent.  Ost LAD lesion is 50% stenosed.  Prox Cx to Mid Cx lesion is 99% stenosed.  1st Mrg lesion is 30% stenosed.  Post intervention, there is a 30% residual stenosis.  Post intervention, there is a 20% residual  stenosis.  The left ventricular systolic function is normal.  LV end diastolic pressure is normal.   Multivessel diffuse stenting with multiple stents inserted from the ostium of the LAD to the distal third of the LAD with focal 50% ostial narrowing; stenting of the proximal to mid circumflex vessel with bifurcation stenting of the OM vessel with 99% stenosis with thrombus in the circumflex stent immediately after the takeoff of the stented OM vessel with TIMI 0-I flow down the distal circumflex; and diffuse stenting of the entire dominant RCA extending from the  ostium to the PDA without significant restenosis.  Preserved global LV function  Very difficult intervention with initial plaque shifting into the OM vessel requiring ultimate PTCA of both the OM ostium as well as the circumflex stent with Cutting Balloon intervention to the circumflex stent due to persistent intimal hyperplasia not resolved with noncompliant balloon dilatation.  Ultimate kissing balloon technique to the OM and circumflex with a residual narrowing in the OM at 20% and in the circumflex at  30% with resultant brisk TIMI-3 flow to the distal circumflex vessel supplying several additional vessels.  RECOMMENDATION: Long-term DAPT indefinitely due to the extensive stenting in this patient who previously had 29 stents placed in all of his major coronary arteries.  Aggressive lipid-lowering therapy with target LDL in the 50s or below with optimal blood pressure control.   EKG:  EKG is personally reviewed.  The ekg ordered today demonstrates sinus bradycardia/sinus arrhythma, RBBB, 1st degree AV block, prior inferior infarct  Recent Labs: 08/09/2020: B Natriuretic Peptide 28.0 09/15/2020: BUN 10; Creatinine, Ser 0.87; Hemoglobin 14.1; Platelets 168; Potassium 3.9; Sodium 140  Recent Lipid Panel    Component Value Date/Time   CHOL 96 (L) 08/17/2020 1339   TRIG 174 (H) 08/17/2020 1339   HDL 32 (L) 08/17/2020 1339   CHOLHDL 3.0 08/17/2020 1339   LDLCALC 35 08/17/2020 1339   LDLDIRECT 33 08/17/2020 1339    Physical Exam:    VS:  BP (!) 150/90 (BP Location: Right Arm, Patient Position: Sitting)   Pulse (!) 56   Ht 6' 1"  (1.854 m)   Wt 295 lb 6.4 oz (134 kg)   SpO2 92%   BMI 38.97 kg/m     Wt Readings from Last 3 Encounters:  10/01/20 295 lb 6.4 oz (134 kg)  09/25/20 293 lb 6.4 oz (133.1 kg)  09/14/20 293 lb (132.9 kg)    GEN: Well nourished, well developed in no acute distress HEENT: Normal, moist mucous membranes NECK: No JVD CARDIAC: regular rhythm, normal S1 and  S2, no rubs or gallops. No murmur. VASCULAR: Radial and DP pulses 2+ bilaterally. No carotid bruits RESPIRATORY:  Clear to auscultation without rales, wheezing or rhonchi  ABDOMEN: Soft, non-tender, non-distended MUSCULOSKELETAL:  Ambulates independently SKIN: Warm and dry, no edema NEUROLOGIC:  Alert and oriented x 3. No focal neuro deficits noted. PSYCHIATRIC:  Normal affect   ASSESSMENT:    1. Paroxysmal atrial fibrillation (HCC)   2. Secondary hypercoagulable state ([place])   3. Coronary artery disease involving native coronary artery of native heart with angina pectoris ([place])   4. Essential hypertension   5. Pure hypercholesterolemia   6. Pacemaker - STJ    PLAN:    Coronary artery disease, with reported 29 prior stents in [place] (no records) -see recent cath, above -has nitroglycerin, instructed on use -instructed on red flag warning signs that need immediate medical attention -continue aspirin, prasugrel -continue atorvastatin -continue metoprolol. Consider imdur, amlodipine  if additional anti-anginals needed  S/P Dual chamber St Jude pacemaker -established with device clinic  Paroxysmal atrial fibrillation: new -discussed risk with patient. Have also discussed with afib clinic -CHA2DS2/VAS Stroke Risk Points=2 -we have discussed DOAC. Given his extensive stent burden, this is a difficult choice. With recent POBA, would continue triple therapy for a time and then need to discuss DOAC plus antiplatelet. -he will consider, continue DAPT for now, discuss at follow up  Hypertension: BP elevated today -continue metoprolol -was on amlodipine prior -discussed options. He wishes to check home BP and follow up. Instructed on this today  Hyperlipidemia: -LDL goal <70, LDL 35 on recent lipids -continue atorvastatin -TG elevated, unclear if fasting  Cardiac risk counseling and prevention recommendations: -recommend heart healthy/Mediterranean diet, with whole grains, fruits,  vegetable, fish, lean meats, nuts, and olive oil. Limit salt. -recommend moderate walking, 3-5 times/week for 30-50 minutes each session. Aim for at least 150 minutes.week. Goal should be pace of 3 miles/hours, or walking 1.5 miles in 30 minutes -recommend avoidance of tobacco products. Avoid excess alcohol.  Plan for follow up: 2 mos or sooner as needed  Buford Dresser, MD, PhD [place]  [facility]    Medication Adjustments/Labs and Tests Ordered: Current medicines are reviewed at length with the patient today.  Concerns regarding medicines are outlined above.  Orders Placed This Encounter  Procedures  . EKG 12-Lead   No orders of the defined types were placed in this encounter.   Patient Instructions  Medication Instructions:  Your Physician recommend you continue on your current medication as directed.    *If you need a refill on your cardiac medications before your next appointment, please call your pharmacy*   Lab Work: None   Testing/Procedures: None   Follow-Up: At [facility], you and your health needs are our priority.  As part of our continuing mission to provide you with exceptional heart care, we have created designated Provider Care Teams.  These Care Teams include your primary Cardiologist (physician) and Advanced Practice Providers (APPs -  Physician Assistants and Nurse Practitioners) who all work together to provide you with the care you need, when you need it.  We recommend signing up for the patient portal called "MyChart".  Sign up information is provided on this After Visit Summary.  MyChart is used to connect with patients for Virtual Visits (Telemedicine).  Patients are able to view lab/test results, encounter notes, upcoming appointments, etc.  Non-urgent messages can be sent to your provider as well.   To learn more about what you can do with MyChart, go to [email or web address].    Your next appointment:   2 month(s)  The format  for your next appointment:   In Person  Provider:   Buford Dresser, MD   -counseled on how to check blood pressure:  -sit comfortably in a chair, feet uncrossed and flat on floor, for 5-10 minutes  -arm ideally should rest at the level of the heart. However, arm should be relaxed and not tense (for example, do not hold the arm up unsupported)  -avoid exercise, caffeine, and tobacco for at least 30 minutes prior to BP reading  -don't take BP cuff reading over clothes (always place on skin directly)  -I prefer to know how well the medication is working, so I would like you to take your readings 1-2 hours after taking your blood pressure medication if possible  Recommend arm cuff, something like Omron brand.    Signed, Buford Dresser,  MD PhD 10/01/2020   [place]

## 2020-10-01 NOTE — Patient Instructions (Addendum)
Medication Instructions:  Your Physician recommend you continue on your current medication as directed.    *If you need a refill on your cardiac medications before your next appointment, please call your pharmacy*   Lab Work: None   Testing/Procedures: None   Follow-Up: At [facility] you and your health needs are our priority.  As part of our continuing mission to provide you with exceptional heart care, we have created designated Provider Care Teams.  These Care Teams include your primary Cardiologist (physician) and Advanced Practice Providers (APPs -  Physician Assistants and Nurse Practitioners) who all work together to provide you with the care you need, when you need it.  We recommend signing up for the patient portal called "MyChart".  Sign up information is provided on this After Visit Summary.  MyChart is used to connect with patients for Virtual Visits (Telemedicine).  Patients are able to view lab/test results, encounter notes, upcoming appointments, etc.  Non-urgent messages can be sent to your provider as well.   To learn more about what you can do with MyChart, go to [email or web address].    Your next appointment:   2 month(s)  The format for your next appointment:   In Person  Provider:   Buford Dresser, MD   -counseled on how to check blood pressure:  -sit comfortably in a chair, feet uncrossed and flat on floor, for 5-10 minutes  -arm ideally should rest at the level of the heart. However, arm should be relaxed and not tense (for example, do not hold the arm up unsupported)  -avoid exercise, caffeine, and tobacco for at least 30 minutes prior to BP reading  -don't take BP cuff reading over clothes (always place on skin directly)  -I prefer to know how well the medication is working, so I would like you to take your readings 1-2 hours after taking your blood pressure medication if possible  Recommend arm cuff, something like Omron brand.

## 2020-10-08 ENCOUNTER — Telehealth: Payer: Self-pay | Admitting: Cardiology

## 2020-10-08 NOTE — Telephone Encounter (Signed)
Spoke with pt, he went for a walk today and after 1/2 mile he developed right sided chest pain. He continued to walk and after resting at home it went away. No other symptoms reported. He was told to report these symptoms to dr Harrell Gave. He is currently pain free and walking up the stairs to his apartment did not bother him. Will make dr Harrell Gave aware.

## 2020-10-08 NOTE — Telephone Encounter (Signed)
Pt c/o of Chest Pain: STAT if CP now or developed within 24 hours  1. Are you having CP right now? Yes   2. Are you experiencing any other symptoms (ex. SOB, nausea, vomiting, sweating)? No  3. How long have you been experiencing CP? Today   4. Is your CP continuous or coming and going? Coming and going  5. Have you taken Nitroglycerin? No ?

## 2020-10-09 NOTE — Telephone Encounter (Signed)
Left message for pt of dr christopher's recommendations.

## 2020-10-09 NOTE — Telephone Encounter (Signed)
Thanks. OK to continue activity.

## 2020-11-04 ENCOUNTER — Other Ambulatory Visit: Payer: Self-pay | Admitting: Family Medicine

## 2020-11-16 ENCOUNTER — Emergency Department (HOSPITAL_COMMUNITY): Payer: Medicare HMO

## 2020-11-16 ENCOUNTER — Other Ambulatory Visit: Payer: Self-pay

## 2020-11-16 ENCOUNTER — Encounter (HOSPITAL_COMMUNITY): Payer: Self-pay | Admitting: Emergency Medicine

## 2020-11-16 ENCOUNTER — Inpatient Hospital Stay (HOSPITAL_COMMUNITY)
Admission: EM | Admit: 2020-11-16 | Discharge: 2020-11-30 | DRG: 234 | Disposition: A | Discharge status: Home / Self Care | Payer: Medicare HMO | Attending: Surgery | Admitting: Surgery

## 2020-11-16 DIAGNOSIS — E8770 Fluid overload, unspecified: Secondary | ICD-10-CM | POA: Diagnosis not present

## 2020-11-16 DIAGNOSIS — Z79899 Other long term (current) drug therapy: Secondary | ICD-10-CM | POA: Diagnosis not present

## 2020-11-16 DIAGNOSIS — E785 Hyperlipidemia, unspecified: Secondary | ICD-10-CM | POA: Diagnosis present

## 2020-11-16 DIAGNOSIS — Z8249 Family history of ischemic heart disease and other diseases of the circulatory system: Secondary | ICD-10-CM

## 2020-11-16 DIAGNOSIS — Z950 Presence of cardiac pacemaker: Secondary | ICD-10-CM

## 2020-11-16 DIAGNOSIS — Z955 Presence of coronary angioplasty implant and graft: Secondary | ICD-10-CM | POA: Diagnosis not present

## 2020-11-16 DIAGNOSIS — Z7982 Long term (current) use of aspirin: Secondary | ICD-10-CM

## 2020-11-16 DIAGNOSIS — Z6837 Body mass index (BMI) 37.0-37.9, adult: Secondary | ICD-10-CM

## 2020-11-16 DIAGNOSIS — Z01810 Encounter for preprocedural cardiovascular examination: Secondary | ICD-10-CM

## 2020-11-16 DIAGNOSIS — E669 Obesity, unspecified: Secondary | ICD-10-CM | POA: Diagnosis present

## 2020-11-16 DIAGNOSIS — I480 Paroxysmal atrial fibrillation: Secondary | ICD-10-CM | POA: Diagnosis present

## 2020-11-16 DIAGNOSIS — R0789 Other chest pain: Secondary | ICD-10-CM | POA: Diagnosis present

## 2020-11-16 DIAGNOSIS — Z20822 Contact with and (suspected) exposure to covid-19: Secondary | ICD-10-CM | POA: Diagnosis present

## 2020-11-16 DIAGNOSIS — Z713 Dietary counseling and surveillance: Secondary | ICD-10-CM

## 2020-11-16 DIAGNOSIS — D6869 Other thrombophilia: Secondary | ICD-10-CM | POA: Diagnosis present

## 2020-11-16 DIAGNOSIS — I249 Acute ischemic heart disease, unspecified: Secondary | ICD-10-CM | POA: Diagnosis not present

## 2020-11-16 DIAGNOSIS — F40240 Claustrophobia: Secondary | ICD-10-CM | POA: Diagnosis not present

## 2020-11-16 DIAGNOSIS — Z7902 Long term (current) use of antithrombotics/antiplatelets: Secondary | ICD-10-CM

## 2020-11-16 DIAGNOSIS — Z951 Presence of aortocoronary bypass graft: Secondary | ICD-10-CM

## 2020-11-16 DIAGNOSIS — I119 Hypertensive heart disease without heart failure: Secondary | ICD-10-CM | POA: Diagnosis not present

## 2020-11-16 DIAGNOSIS — R079 Chest pain, unspecified: Secondary | ICD-10-CM | POA: Diagnosis present

## 2020-11-16 DIAGNOSIS — I25110 Atherosclerotic heart disease of native coronary artery with unstable angina pectoris: Secondary | ICD-10-CM | POA: Diagnosis present

## 2020-11-16 DIAGNOSIS — Z09 Encounter for follow-up examination after completed treatment for conditions other than malignant neoplasm: Secondary | ICD-10-CM

## 2020-11-16 LAB — RESP PANEL BY RT-PCR (FLU A&B, COVID) ARPGX2
Influenza A by PCR: NEGATIVE
Influenza B by PCR: NEGATIVE
SARS Coronavirus 2 by RT PCR: NEGATIVE

## 2020-11-16 LAB — CBC
HCT: 53.3 % — ABNORMAL HIGH (ref 39.0–52.0)
Hemoglobin: 17.2 g/dL — ABNORMAL HIGH (ref 13.0–17.0)
MCH: 29.6 pg (ref 26.0–34.0)
MCHC: 32.3 g/dL (ref 30.0–36.0)
MCV: 91.7 fL (ref 80.0–100.0)
Platelets: 204 10*3/uL (ref 150–400)
RBC: 5.81 MIL/uL (ref 4.22–5.81)
RDW: 12.2 % (ref 11.5–15.5)
WBC: 7.6 10*3/uL (ref 4.0–10.5)
nRBC: 0 % (ref 0.0–0.2)

## 2020-11-16 LAB — BASIC METABOLIC PANEL
Anion gap: 12 (ref 5–15)
BUN: 11 mg/dL (ref 8–23)
CO2: 24 mmol/L (ref 22–32)
Calcium: 9.4 mg/dL (ref 8.9–10.3)
Chloride: 103 mmol/L (ref 98–111)
Creatinine, Ser: 0.88 mg/dL (ref 0.61–1.24)
GFR, Estimated: >60 mL/min (ref >60)
Glucose, Bld: 168 mg/dL — ABNORMAL HIGH (ref 70–99)
Potassium: 3.4 mmol/L — ABNORMAL LOW (ref 3.5–5.1)
Sodium: 139 mmol/L (ref 135–145)

## 2020-11-16 LAB — TROPONIN I (HIGH SENSITIVITY)
Troponin I (High Sensitivity): 8 ng/L (ref <18)
Troponin I (High Sensitivity): 8 ng/L (ref <18)

## 2020-11-16 MED ORDER — IOHEXOL 350 MG/ML SOLN
75.0000 mL | Freq: Once | INTRAVENOUS | Status: AC | PRN
  Administered 2020-11-16: 75 mL via INTRAVENOUS

## 2020-11-16 NOTE — ED Triage Notes (Signed)
Pt reports hx of over 30 stents. States last 10/21. Began having substernal chest pain radiating to right upper shoulder and back area. Pt reports breaking out in cold sweats yesterday as well as had palpitations. Pt awake, alert, appropriate, NAD at present. Took 162mg  ASA at home this morning. 3sl nitro with some improvement in pain.

## 2020-11-16 NOTE — ED Notes (Signed)
Pt hypoxic 88-89% on RA. Pt placed on 2L [place]

## 2020-11-16 NOTE — ED Provider Notes (Signed)
[facility] EMERGENCY DEPARTMENT Provider Note   CSN: [id number] Arrival date & time: 11/16/20  2028     History Chief Complaint  Patient presents with  . Chest Pain    Ryan Clarke is a 64 y.o. male.  HPI Patient presents with chest pain.  States over the last 4 days he has had pain in his anterior chest.  States it came particularly on after eating.  States he had some mild pain today when he had not ate anything but then ate some food and became more severe.  It is dull in the mid chest.  Does not really feel like his previous cardiac disease, however has had 30 cardiac stents.  Last cath was around 2 months ago. states he has felt more short of breath over the last couple weeks however.  States he had to take more nitroglycerin today.  States he took nitroglycerin and then the pain had improved some.  Has been vaccinated for Covid.  Has had sick contacts with family members with Covid however.    Past Medical History:  Diagnosis Date  . Coronary artery disease   . Hypertension     Patient Active Problem List   Diagnosis Date Noted  . CAD (coronary artery disease), native coronary artery 09/14/2020  . Carpal tunnel syndrome 09/10/2020  . Paroxysmal atrial fibrillation ([place]) 09/04/2020  . Secondary hypercoagulable state ([place]) 09/04/2020  . Pacemaker - STJ 08/16/2020  . CAD (coronary artery disease) 08/15/2020  . Hypertension 08/15/2020    Past Surgical History:  Procedure Laterality Date  . CORONARY BALLOON ANGIOPLASTY N/A 09/14/2020   Procedure: CORONARY BALLOON ANGIOPLASTY;  Surgeon: Troy Sine, MD;  Location: [facility];  Service: Cardiovascular;  Laterality: N/A;  . CORONARY STENT PLACEMENT    . LEFT HEART CATH AND CORONARY ANGIOGRAPHY N/A 09/14/2020   Procedure: LEFT HEART CATH AND CORONARY ANGIOGRAPHY;  Surgeon: Troy Sine, MD;  Location: [facility];  Service: Cardiovascular;  Laterality: N/A;  . PACEMAKER INSERTION          History reviewed. No pertinent family history.  Social History   Tobacco Use  . Smoking status: Never Smoker  . Smokeless tobacco: Never Used  Substance Use Topics  . Alcohol use: Never  . Drug use: Never    Home Medications Prior to Admission medications   Medication Sig Start Date End Date Taking? Authorizing Provider  famotidine (PEPCID) 20 MG tablet TAKE ONE-HALF TABLET BY MOUTH 2 TIMES DAILY AS NEEDED FOR HEARTBURN OR INDIGESTION. 11/05/20   [name], MD  aspirin EC 81 MG tablet Take 81 mg by mouth daily. Swallow whole.    [provider]  atorvastatin (LIPITOR) 80 MG tablet Take 80 mg by mouth at bedtime.  07/27/20   [provider]  metoprolol succinate (TOPROL-XL) 25 MG 24 hr tablet Take 25 mg by mouth daily. 08/28/20   [provider]  nitroGLYCERIN (NITROSTAT) 0.4 MG SL tablet Place 1 tablet (0.4 mg total) under the tongue every 5 (five) minutes as needed for chest pain. 08/14/20 11/12/20  Buford Dresser, MD  prasugrel (EFFIENT) 10 MG TABS tablet Take 10 mg by mouth daily. 08/01/20   [provider]    Allergies    Patient has no known allergies.  Review of Systems   Review of Systems  Constitutional: Negative for appetite change and fever.  HENT: Negative for congestion.   Respiratory: Positive for shortness of breath.   Cardiovascular: Positive for  chest pain.  Gastrointestinal: Negative for nausea and vomiting.  Genitourinary: Negative for frequency.  Musculoskeletal: Negative for back pain.  Skin: Negative for rash.  Neurological: Negative for weakness.  Psychiatric/Behavioral: Negative for confusion.    Physical Exam Updated Vital Signs BP 101/82   Pulse 74   Temp 97.9 F (36.6 C) (Oral)   Resp 15   SpO2 92%   Physical Exam Vitals and nursing note reviewed.  HENT:     Head: Atraumatic.  Cardiovascular:     Rate and Rhythm: Normal rate and regular rhythm.  Pulmonary:     Effort: No tachypnea.      Breath sounds: No wheezing, rhonchi or rales.  Chest:     Chest wall: No tenderness.  Abdominal:     Tenderness: There is no abdominal tenderness.  Musculoskeletal:     Right lower leg: No edema.     Left lower leg: No edema.  Skin:    General: Skin is warm.     Capillary Refill: Capillary refill takes less than 2 seconds.  Neurological:     Mental Status: He is alert and oriented to person, place, and time.     ED Results / Procedures / Treatments   Labs (all labs ordered are listed, but only abnormal results are displayed) Labs Reviewed  BASIC METABOLIC PANEL - Abnormal; Notable for the following components:      Result Value   Potassium 3.4 (*)    Glucose, Bld 168 (*)    All other components within normal limits  CBC - Abnormal; Notable for the following components:   Hemoglobin 17.2 (*)    HCT 53.3 (*)    All other components within normal limits  RESP PANEL BY RT-PCR (FLU A&B, COVID) ARPGX2  TROPONIN I (HIGH SENSITIVITY)  TROPONIN I (HIGH SENSITIVITY)    EKG EKG Interpretation  Date/Time:  Friday November 16 2020 20:25:59 EST Ventricular Rate:  89 PR Interval:  198 QRS Duration: 146 QT Interval:  396 QTC Calculation: 481 R Axis:   -41 Text Interpretation: Normal sinus rhythm Left axis deviation Right bundle branch block Possible Lateral infarct , age undetermined Abnormal ECG since last tracing no significant change Confirmed by Mancel Bale [phone]) on 11/16/2020 8:34:01 PM Also confirmed by Benjiman Core [phone])  on 11/16/2020 9:29:58 PM   Radiology DG Chest 2 View  Result Date: 11/16/2020 CLINICAL DATA:  Substernal chest pain radiating to the right shoulder and back. Sweats and palpitations. History of multiple stents. EXAM: CHEST - 2 VIEW COMPARISON:  08/09/2020 FINDINGS: Cardiac pacemaker. Heart size and pulmonary vascularity are normal. Lungs are clear. No pleural effusions. No pneumothorax. Mediastinal contours appear intact. IMPRESSION: No  active cardiopulmonary disease. Electronically Signed   By: Burman Nieves M.D.   On: 11/16/2020 21:05    Procedures Procedures (including critical care time)  Medications Ordered in ED Medications  iohexol (OMNIPAQUE) 350 MG/ML injection 75 mL (75 mLs Intravenous Contrast Given 11/16/20 2335)    ED Course  I have reviewed the triage vital signs and the nursing notes.  Pertinent labs & imaging results that were available during my care of the patient were reviewed by me and considered in my medical decision making (see chart for details).    MDM Rules/Calculators/A&P                          Patient presents with chest pain.  Has had over the last 4 days but states has  been more short of breath for a few days before that.  States that a few days before that has had worsening shortness of breath with exertion.  Has had over 30 stents.  This pain that he is having for the last 4 days feels different than his previous cardiac pain but is in his anterior chest.  However has had worsening exertional dyspnea over the last week also.  Troponin is negative and EKG reassuring.  However did have episode of hypoxia with sats down to 89%.  Had been had some Covid exposures.  However Covid PCR negative.  I think being so high risk with his cardiac cause will require admission to the hospital, however will get CTA to evaluate for pulmonary embolism.  Care turned over to Dr. Waverly Ferrari. Final Clinical Impression(s) / ED Diagnoses Final diagnoses:  Chest pain, unspecified type    Rx / DC Orders ED Discharge Orders    None       Davonna Belling, MD 11/16/20 2355

## 2020-11-17 ENCOUNTER — Other Ambulatory Visit: Payer: Self-pay

## 2020-11-17 DIAGNOSIS — R0789 Other chest pain: Secondary | ICD-10-CM

## 2020-11-17 DIAGNOSIS — K219 Gastro-esophageal reflux disease without esophagitis: Secondary | ICD-10-CM

## 2020-11-17 DIAGNOSIS — I200 Unstable angina: Secondary | ICD-10-CM

## 2020-11-17 DIAGNOSIS — R079 Chest pain, unspecified: Secondary | ICD-10-CM

## 2020-11-17 LAB — BASIC METABOLIC PANEL
Anion gap: 11 (ref 5–15)
BUN: 12 mg/dL (ref 8–23)
CO2: 25 mmol/L (ref 22–32)
Calcium: 8.9 mg/dL (ref 8.9–10.3)
Chloride: 104 mmol/L (ref 98–111)
Creatinine, Ser: 0.78 mg/dL (ref 0.61–1.24)
GFR, Estimated: >60 mL/min (ref >60)
Glucose, Bld: 91 mg/dL (ref 70–99)
Potassium: 3.8 mmol/L (ref 3.5–5.1)
Sodium: 140 mmol/L (ref 135–145)

## 2020-11-17 LAB — CBC
HCT: 49.6 % (ref 39.0–52.0)
Hemoglobin: 16 g/dL (ref 13.0–17.0)
MCH: 29.6 pg (ref 26.0–34.0)
MCHC: 32.3 g/dL (ref 30.0–36.0)
MCV: 91.9 fL (ref 80.0–100.0)
Platelets: 187 10*3/uL (ref 150–400)
RBC: 5.4 MIL/uL (ref 4.22–5.81)
RDW: 12.2 % (ref 11.5–15.5)
WBC: 6.8 10*3/uL (ref 4.0–10.5)
nRBC: 0 % (ref 0.0–0.2)

## 2020-11-17 LAB — TROPONIN I (HIGH SENSITIVITY)
Troponin I (High Sensitivity): 63 ng/L — ABNORMAL HIGH (ref <18)
Troponin I (High Sensitivity): 93 ng/L — ABNORMAL HIGH (ref <18)
Troponin I (High Sensitivity): 106 ng/L (ref <18)
Troponin I (High Sensitivity): 123 ng/L (ref <18)

## 2020-11-17 LAB — BRAIN NATRIURETIC PEPTIDE: B Natriuretic Peptide: 31.5 pg/mL (ref 0.0–100.0)

## 2020-11-17 MED ORDER — HEPARIN BOLUS VIA INFUSION
4000.0000 [IU] | Freq: Once | INTRAVENOUS | Status: AC
  Administered 2020-11-17: 4000 [IU] via INTRAVENOUS
  Filled 2020-11-17: qty 4000

## 2020-11-17 MED ORDER — ASPIRIN EC 81 MG PO TBEC
81.0000 mg | DELAYED_RELEASE_TABLET | Freq: Every day | ORAL | Status: DC
  Administered 2020-11-17 – 2020-11-25 (×8): 81 mg via ORAL
  Filled 2020-11-17 (×7): qty 1

## 2020-11-17 MED ORDER — PANTOPRAZOLE SODIUM 40 MG PO TBEC
40.0000 mg | DELAYED_RELEASE_TABLET | Freq: Every day | ORAL | Status: DC
  Administered 2020-11-17 – 2020-11-25 (×9): 40 mg via ORAL
  Filled 2020-11-17 (×9): qty 1

## 2020-11-17 MED ORDER — ATORVASTATIN CALCIUM 80 MG PO TABS
80.0000 mg | ORAL_TABLET | Freq: Every day | ORAL | Status: DC
  Administered 2020-11-17 – 2020-11-29 (×12): 80 mg via ORAL
  Filled 2020-11-17 (×13): qty 1

## 2020-11-17 MED ORDER — ENOXAPARIN SODIUM 40 MG/0.4ML ~~LOC~~ SOLN
40.0000 mg | Freq: Every day | SUBCUTANEOUS | Status: DC
  Administered 2020-11-17: 40 mg via SUBCUTANEOUS
  Filled 2020-11-17: qty 0.4

## 2020-11-17 MED ORDER — POTASSIUM CHLORIDE CRYS ER 20 MEQ PO TBCR
40.0000 meq | EXTENDED_RELEASE_TABLET | Freq: Once | ORAL | Status: AC
  Administered 2020-11-17: 40 meq via ORAL
  Filled 2020-11-17: qty 2

## 2020-11-17 MED ORDER — METOPROLOL SUCCINATE ER 25 MG PO TB24
25.0000 mg | ORAL_TABLET | Freq: Every day | ORAL | Status: DC
  Administered 2020-11-17 – 2020-11-19 (×3): 25 mg via ORAL
  Filled 2020-11-17 (×3): qty 1

## 2020-11-17 MED ORDER — HEPARIN (PORCINE) 25000 UT/250ML-% IV SOLN
1500.0000 [IU]/h | INTRAVENOUS | Status: DC
  Administered 2020-11-17: 1600 [IU]/h via INTRAVENOUS
  Administered 2020-11-18: 1500 [IU]/h via INTRAVENOUS
  Filled 2020-11-17 (×2): qty 250

## 2020-11-17 MED ORDER — PRASUGREL HCL 10 MG PO TABS
10.0000 mg | ORAL_TABLET | Freq: Every day | ORAL | Status: DC
  Administered 2020-11-17 – 2020-11-19 (×3): 10 mg via ORAL
  Filled 2020-11-17 (×3): qty 1

## 2020-11-17 NOTE — Progress Notes (Signed)
ANTICOAGULATION CONSULT NOTE - Follow Up Consult  Pharmacy Consult for Heparin Indication: chest pain/ACS  No Known Allergies  Patient Measurements: Height: 6\' 1"  (185.4 cm) Weight: 128.6 kg (283 lb 8.2 oz) IBW/kg (Calculated) : 79.9 Heparin Dosing Weight: 112 kg  Vital Signs: Temp: 98.3 F (36.8 C) (01/01 1653) Temp Source: Oral (01/01 1653) BP: 122/78 (01/01 1653) Pulse Rate: 49 (01/01 1214)  Labs: Recent Labs    11/16/20 2039 11/16/20 2240 11/17/20 0345 11/17/20 0618 11/17/20 1052 11/17/20 1445 11/17/20 1717  HGB 17.2*  --  16.0  --   --   --   --   HCT 53.3*  --  49.6  --   --   --   --   PLT 204  --  187  --   --   --   --   CREATININE 0.88  --  0.78  --   --   --   --   TROPONINIHS 8   < >  --    < > 93* 106* 123*   < > = values in this interval not displayed.    Estimated Creatinine Clearance: 131.2 mL/min (by C-G formula based on SCr of 0.78 mg/dL).  Assessment: 65 year old male to begin heparin for chest pain with plans for cath/PCI on Monday  Goal of Therapy:  Heparin level 0.3-0.7 units/ml Monitor platelets by anticoagulation protocol: Yes   Plan:  Heparin 4000 units iv bolus x 1 Heparin drip at 1600 units / hr Heparin level in 6 hours Daily heparin level / CBC  Thank you Tuesday, PharmD  11/17/2020,6:31 PM

## 2020-11-17 NOTE — Hospital Course (Addendum)
Ryan Clarke is a 65 y.o. male presenting with chest discomfort. PMH is significant for CAD s/p 30 stents, PAF, and HTN.    ACS r/o 2/2 chest discomfort  Stable angina v. Acid reflux Initially seen in ED for chest pain. On admission patient described chest discomfort with pressure that did not improve with nitroglycerin. CXR w/o acute findings, CTA chest w/o PE, EKG wnl and unchanged from last. Trop 8>63>93>106>123. Follow up EKG. Patient has extensive history of CAD and s/p 29 stenting events, most recently in October 2021. Cardiology recommended treatment with heparin and left heart cath ***  PAF Patient found to have atrial fibrillation in October 2021 at last cardiac stenting, thought to be due to blockage. A fib resolved after PCI. Patient remained with sinus brady rhythm during this admission***, no a fib observed on telemetry. Patient follows with Dr. Hughie Closs, cardiology, who had discussed DOAC in the passed, but with resolution of A fib after PCI in October, decided to continue to monitor per patient. CHADS2 VAS2C score moderate-high at 3 for age, TN, and h/o CAD, HAS BLED score of 2 low. Recommend patient follow up with cardiology outpatient.   -f/u with Cardiology outpt regarding NOAC for pAF

## 2020-11-17 NOTE — Progress Notes (Addendum)
MD on call notified of pt having a positive troponin that increased to 63. His previous lab was 8. Pt denies cp  Or sob. Vs stable & charted. Sanda Linger, RN   MD called again as pt's trop is elevated at 93. ekg to be done. Pt asymptomatic. 11:40 am

## 2020-11-17 NOTE — H&P (Addendum)
[facility] Admission History and Physical Service Pager: [phone]  Patient name: Ryan Clarke Medical record number: [id number] Date of birth: [birth year] Age: 65 y.o. Gender: male  Primary Care Provider: [name], MD Consultants: N/a Code Status: FULL Preferred Emergency Contact: Lucy Chris [phone] (significant other)  Chief Complaint: Chest discomfort  Assessment and Plan: Bryer Rolli is a 65 y.o. male presenting with chest discomfort. PMH is significant for CAD s/p 30 stents, PAF, and HTN.   ACS r/o 2/2 chest discomfort  Stable angina v. Acid reflux Initially seen in ED for chest pain. On admission patient describes more of a chest discomfort that occurs shortly after eating. It is centralized with some radiation to the right chest wall. Associated symptoms of nausea and bloating. Patient has an extensive cardiac hx s/p 30 stents, recent heart cath, and continued on DAPT. Most recent cardiology appt with Dr. Harrell Gave 10/01/20 considering stable angina v. GI symptoms for chest pain.  ED work up s/f 89% O2 on RA (placed on 2L O2; turned off during admission patient O2 90-94% adjusted to 1L). BP wnl, K 3.4, Glu 168. CXR w/o acute findings, CTA chest w/o PE, EKG wnl and unchanged from last. Trop 8>8. On exam he appears well and has normal effort of breathing. He is CTAB and abdominal exam is begnin. No LE edema. With relation to food could be untreated acid reflux manifesting as cardiac etiology. Lower suspicion for cardiac etiology at this time (Heart score 3 giving lower indication) but will consider cards consult in the morning due to patient hx. For now will admit for observation repeat EKG labs and trop. Obtain BNP. And trial PPI for symptomatic relief -Admit to [place], Attending Dr. Nori Riis -Heart healthy diet -Full code -F/U BNP -AM EKG, trop -AM CBC. BMP -Kdur 106mEq x1 -Protonix 40mg   -Cardiac monitoring -VS per protocol -O2  for sats <92%  CAD s/p multiple stents Has hx of 29 stents.  Left heart cath 09/14/2020 with some difficulty (appreciate read below). Most recent cardiology appt with Dr. Harrell Gave 10/01/20 considering stable angina v. GI symptoms for chest pain. Trops and EKG unremarkable at this time. Will continue to monitor on tele.  -Continue DAPT and lipitor 80 mg  -AM ekg, trop -f/u BNP -Consider cards consult   HTN BP on admission 120/79. Well controlled.  -Continue metoprolol 25 mg  -VS per protocol   PAF HR 86 on admission EKG, NSR -Continue metoprolol as above -AM ekg as above  FEN/GI: heart healthy Prophylaxis: Lovenox  Disposition: Tele-Med OBV  History of Present Illness:  Ryan Clarke is a 64 y.o. male presenting with several days of chest discomfort after eating. Initially thought to be chest pain but did not have radiation to arm or jaw. He is not currently experiencing this pain or shortness of breath but has felt short of breath with activity lately. Today he started to have chest discomfort after eating and attempted to take famotidine with minimal relief. He subsequently tried 3 nitroglycerin pills with more relief. He also endorses relief with tum pills. He experiencing chest discomfort nausea and bloating with lying flat in CT imaging while in the ED. He denies any other sick symptoms such as fever, cough, congestion, vomiting, diarrhea. Received COVID booster Monday and had 1 day of feeling feverish, body aches, and chills.   Review Of Systems: Per HPI with the following additions:   Review of Systems  Constitutional: Negative for activity change, appetite change, chills and  fever.  HENT: Negative for congestion.   Respiratory: Negative for cough and shortness of breath.   Cardiovascular: Negative for chest pain.  Gastrointestinal: Positive for abdominal distention and nausea. Negative for abdominal pain, constipation, diarrhea and vomiting.  Genitourinary: Negative for  difficulty urinating.  Neurological: Negative for weakness and light-headedness.     Patient Active Problem List   Diagnosis Date Noted  . CAD (coronary artery disease), native coronary artery 09/14/2020  . Carpal tunnel syndrome 09/10/2020  . Paroxysmal atrial fibrillation ([facility]) 09/04/2020  . Secondary hypercoagulable state ([facility]) 09/04/2020  . Pacemaker - STJ 08/16/2020  . CAD (coronary artery disease) 08/15/2020  . Hypertension 08/15/2020    Past Medical History: Past Medical History:  Diagnosis Date  . Coronary artery disease   . Hypertension     Past Surgical History: Past Surgical History:  Procedure Laterality Date  . CORONARY BALLOON ANGIOPLASTY N/A 09/14/2020   Procedure: CORONARY BALLOON ANGIOPLASTY;  Surgeon: Lennette Bihari, MD;  Location: [facility];  Service: Cardiovascular;  Laterality: N/A;  . CORONARY STENT PLACEMENT    . LEFT HEART CATH AND CORONARY ANGIOGRAPHY N/A 09/14/2020   Procedure: LEFT HEART CATH AND CORONARY ANGIOGRAPHY;  Surgeon: Lennette Bihari, MD;  Location: [facility];  Service: Cardiovascular;  Laterality: N/A;  . PACEMAKER INSERTION      Social History: Social History   Tobacco Use  . Smoking status: Never Smoker  . Smokeless tobacco: Never Used  Substance Use Topics  . Alcohol use: Never  . Drug use: Never   Additional social history: N/A Please also refer to relevant sections of EMR.  Family History: History reviewed. No pertinent family history.  Allergies and Medications: No Known Allergies No current facility-administered medications on file prior to encounter.   Current Outpatient Medications on File Prior to Encounter  Medication Sig Dispense Refill  . famotidine (PEPCID) 20 MG tablet TAKE ONE-HALF TABLET BY MOUTH 2 TIMES DAILY AS NEEDED FOR HEARTBURN OR INDIGESTION. 30 tablet 1  . aspirin EC 81 MG tablet Take 81 mg by mouth daily. Swallow whole.    Marland Kitchen atorvastatin (LIPITOR) 80 MG tablet Take 80 mg by  mouth at bedtime.     . metoprolol succinate (TOPROL-XL) 25 MG 24 hr tablet Take 25 mg by mouth daily.    . nitroGLYCERIN (NITROSTAT) 0.4 MG SL tablet Place 1 tablet (0.4 mg total) under the tongue every 5 (five) minutes as needed for chest pain. 25 tablet 3  . prasugrel (EFFIENT) 10 MG TABS tablet Take 10 mg by mouth daily.      Objective: BP 120/79   Pulse 74   Temp 97.9 F (36.6 C) (Oral)   Resp (!) 27   SpO2 97%  Exam: General: Appears well. No acute distress.  Eyes: Normal conjunctivae. EOMI Neck: Supple. No masses or lymphadenopathy Cardiovascular: RRR, no mumurs gallops or rubs. Heart sounds are distant.  Respiratory: Normal effort. CTAB. No wheezing, rales, or rhonchi Gastrointestinal: NABS. Protuberant. No guarding or masses MSK: Moves all ext. Appropriately. No LE edema Derm: No rashes or bruises Neuro: A&Ox4 Psych: Normal mood and affect  Labs and Imaging: CBC BMET  Recent Labs  Lab 11/16/20 2039  WBC 7.6  HGB 17.2*  HCT 53.3*  PLT 204   Recent Labs  Lab 11/16/20 2039  NA 139  K 3.4*  CL 103  CO2 24  BUN 11  CREATININE 0.88  GLUCOSE 168*  CALCIUM 9.4     CHEST - 2  VIEW COMPARISON:  08/09/2020 IMPRESSION: No active cardiopulmonary disease.  CT ANGIOGRAPHY CHEST WITH CONTRAST IMPRESSION: Slightly suboptimal opacification of the main pulmonary artery, however no central or proximal segmental pulmonary embolism. No acute intrathoracic pathology to explain the patient's symptoms. Nonspecific low-density lesions within the liver. Cholelithiasis  EKG: My own interpretation (not copied from electronic read)  NSR HR 89 Unchanged from last  CARDIAC CATH: 09/14/2020  Previously placed RPAV stent (unknown type) is widely patent.  Ost RCA to Dist RCA lesion is 5% stenosed.  Previously placed Prox LAD to Dist LAD stent (unknown type) is widely patent.  Ost LAD lesion is 50% stenosed.  Prox Cx to Mid Cx lesion is 99% stenosed.  1st Mrg lesion  is 30% stenosed.  Post intervention, there is a 30% residual stenosis.  Post intervention, there is a 20% residual stenosis.  The left ventricular systolic function is normal.  LV end diastolic pressure is normal.  Autry-Lott, Jeramey Lanuza, DO 11/17/2020, 12:16 AM PGY-2, [facility] pager: [phone], text pages welcome

## 2020-11-17 NOTE — Progress Notes (Addendum)
FPTS Interim Progress Note  S: Sleeping soundly.   O: BP 129/72 (BP Location: Left Arm)   Pulse (!) 59   Temp 97.8 F (36.6 C) (Oral)   Resp 16   Ht 6\' 1"  (1.854 m)   Wt 128.6 kg   SpO2 97%   BMI 37.40 kg/m   General: Sleeping no acute distress. Age appropriate. Respiratory: normal effort.  A/P:  ACS r/o 2/2 chest discomfort  Stable angina v. Acid reflux Sound asleep. [place] in nares but satting 96% on RA. Reviewed tele sinus brady since floor admission. BNP 31.5. AM trop pending. BMP wnl s/p Kdur x1.  -F/u AM trop -Protonix 40mg   -Cardiac monitoring -VS per protocol -O2 for sats <92% -Ambulate with pulse ox  CAD s/p multiple stents Has hx of 29 stents.  Left heart cath 09/14/2020 with some difficulty (appreciate read below). Most recent cardiology appt with Dr. 10/01/20 considering stable angina v. GI symptoms for chest pain.  -Continue DAPT and lipitor 80 mg  -AM ekg, trop -Consider cards consult although admission not suspicion for cardiac etiology  HTN BP 129/72. Well controlled.  -Continue metoprolol 25 mg  -VS per protocol   PAF HR 56 on monitor. Tele w/ sinus brady.  -Continue metoprolol as above -AM ekg as above -Consider cards consult as above.   FEN/GI: heart healthy Prophylaxis: Lovenox  Disposition: Tele-Med OBV  Ryan Clarke, Ryan Deer, DO 11/17/2020, 6:12 AM PGY-2, [facility] Family Medicine Service pager [phone]

## 2020-11-17 NOTE — Progress Notes (Signed)
Oxygen Therapy   Treatment Indications: SpO2 less than 92% or MD goal    Treatment Goals: Goal is to treat myocardial O2 demand    Plan of Care: Pulse oximetry 92   Pt's O2 sat at rest was 97% on RA while ambulating the hallways 300 ft pt was not sob & the lowest his O2 sat went on RA was 93%. Pt does not qualify for O2.  Sanda Linger, RN

## 2020-11-17 NOTE — Discharge Summary (Shared)
Family Medicine Teaching [facility] Discharge Summary  Patient name: Ryan Clarke Medical record number: [id number] Date of birth: Feb 06, 1956 Age: 65 y.o. Gender: male Date of Admission: 11/16/2020  Date of Discharge: *** Admitting Physician: Nestor Ramp, MD  Primary Care Provider: Littie Deeds, MD Consultants: ***  Indication for Hospitalization: ***  Discharge Diagnoses/Problem List:  ***  Disposition: ***  Discharge Condition: ***  Discharge Exam: [place] [facility] Course:  Ryan Clarke is a 65 y.o. male presenting with chest discomfort. PMH is significant for CAD s/p 30 stents, PAF, and HTN.    ACS r/o 2/2 chest discomfort  Stable angina v. Acid reflux Initially seen in ED for chest pain. On admission patient described chest discomfort that occurs shortly after eating.  ED work up s/f 89% O2 on RA (placed on 2L O2; turned off during admission patient O2 90-94% adjusted to 1L). BP wnl, K 3.4, Glu 168. BNP 31. CXR w/o acute findings, CTA chest w/o PE, EKG wnl and unchanged from last. Trop 8>8>*. Follow up EKG. Trial of protonix ***. Cards not consulted during this admission due to ***. Respiratory status improve and patient was ambulated with pulse ox***    Issues for Follow Up:  1. ***  Significant Procedures: ***  Significant Labs and Imaging:  Recent Labs  Lab 11/16/20 2039 11/17/20 0345  WBC 7.6 6.8  HGB 17.2* 16.0  HCT 53.3* 49.6  PLT 204 187   Recent Labs  Lab 11/16/20 2039 11/17/20 0345  NA 139 140  K 3.4* 3.8  CL 103 104  CO2 24 25  GLUCOSE 168* 91  BUN 11 12  CREATININE 0.88 0.78  CALCIUM 9.4 8.9    ***  Results/Tests Pending at Time of Discharge: ***  Discharge Medications:  Allergies as of 11/17/2020   No Known Allergies   Med Rec must be completed prior to using this [facility]***       Discharge Instructions: Please refer to Patient Instructions section of EMR for full details.  Patient was counseled important signs and  symptoms that should prompt return to medical care, changes in medications, dietary instructions, activity restrictions, and follow up appointments.   Follow-Up Appointments:   Lavonda Jumbo, DO 11/17/2020, 7:14 AM PGY-***, [facility] Family Medicine

## 2020-11-17 NOTE — Consult Note (Signed)
Cardiology Consultation:   Patient ID: Ryan Clarke MRN: [id number]; DOB: 1956/09/13  Admit date: 11/16/2020 Date of Consult: 11/17/2020  Primary Care Provider: [name], MD [facility] Cardiologist: Buford Dresser, MD [place] Electrophysiologist:  None   Patient Profile:   Ryan Clarke is a 65 y.o. male with a hx of CAD who is being seen today for the evaluation of [place] at the request of Dr. Nori Riis.  History of Present Illness:   Ryan Clarke presents today with sscp. He has a h/o CAD, s/p multiple PCI's most recently with a POBA in 10/21. He has done well but over the past few weeks has had progressive indigestion both with physical activity and with eating. These are the symptoms he normally experiences prior to previous PCI's. He noted indigestion prior to other PCI's including the most recent one in October of 21. He has dyspnea with exertion. He denies medical non-compliance.    Past Medical History:  Diagnosis Date  . Coronary artery disease   . Hypertension     Past Surgical History:  Procedure Laterality Date  . CORONARY BALLOON ANGIOPLASTY N/A 09/14/2020   Procedure: CORONARY BALLOON ANGIOPLASTY;  Surgeon: Troy Sine, MD;  Location: [place] [facility];  Service: Cardiovascular;  Laterality: N/A;  . CORONARY STENT PLACEMENT    . LEFT HEART CATH AND CORONARY ANGIOGRAPHY N/A 09/14/2020   Procedure: LEFT HEART CATH AND CORONARY ANGIOGRAPHY;  Surgeon: Troy Sine, MD;  Location: [place] [facility];  Service: Cardiovascular;  Laterality: N/A;  . PACEMAKER INSERTION       Home Medications:  Prior to Admission medications   Medication Sig Start Date End Date Taking? Authorizing Provider  aspirin EC 81 MG tablet Take 81 mg by mouth daily. Swallow whole.   Yes [provider]  atorvastatin (LIPITOR) 80 MG tablet Take 80 mg by mouth at bedtime.  07/27/20  Yes [provider]  famotidine (PEPCID) 20 MG tablet TAKE ONE-HALF TABLET BY MOUTH  2 TIMES DAILY AS NEEDED FOR HEARTBURN OR INDIGESTION. Patient taking differently: Take 10 mg by mouth 2 (two) times daily as needed for heartburn or indigestion. 11/05/20  Yes [name], MD  metoprolol succinate (TOPROL-XL) 25 MG 24 hr tablet Take 25 mg by mouth daily. 08/28/20  Yes [provider]  nitroGLYCERIN (NITROSTAT) 0.4 MG SL tablet Place 1 tablet (0.4 mg total) under the tongue every 5 (five) minutes as needed for chest pain. 08/14/20 11/12/20 Yes Buford Dresser, MD  prasugrel (EFFIENT) 10 MG TABS tablet Take 10 mg by mouth daily. 08/01/20  Yes [provider]    Inpatient Medications: Scheduled Meds: . aspirin EC  81 mg Oral Daily  . atorvastatin  80 mg Oral QHS  . enoxaparin (LOVENOX) injection  40 mg Subcutaneous Daily  . metoprolol succinate  25 mg Oral Daily  . pantoprazole  40 mg Oral Daily  . prasugrel  10 mg Oral Daily   Continuous Infusions:  PRN Meds:   Allergies:   No Known Allergies  Social History:   Social History   Socioeconomic History  . Marital status: Legally Separated    Spouse name: Not on file  . Number of children: Not on file  . Years of education: Not on file  . Highest education level: Not on file  Occupational History  . Not on file  Tobacco Use  . Smoking status: Never Smoker  . Smokeless tobacco: Never Used  Substance and Sexual Activity  . Alcohol use: Never  .  Drug use: Never  . Sexual activity: Not on file  Other Topics Concern  . Not on file  Social History Narrative  . Not on file   Social Determinants of Health   Financial Resource Strain: Not on file  Food Insecurity: Not on file  Transportation Needs: Not on file  Physical Activity: Not on file  Stress: Not on file  Social Connections: Not on file  Intimate Partner Violence: Not on file    Family History:   History reviewed. No pertinent family history.   ROS:  Please see the history of present illness.   All other ROS reviewed and  negative.     Physical Exam/Data:   Vitals:   11/17/20 0430 11/17/20 0811 11/17/20 0951 11/17/20 1214  BP:  126/82 138/82 (!) 134/96  Pulse:  69 72 (!) 49  Resp:   18 18  Temp:   97.9 F (36.6 C) 98.1 F (36.7 C)  TempSrc:   Oral Oral  SpO2:  100% 100% 95%  Weight: 128.6 kg     Height: 6\' 1"  (1.854 m)      No intake or output data in the 24 hours ending 11/17/20 1627 Last 3 Weights 11/17/2020 10/01/2020 09/25/2020  Weight (lbs) 283 lb 8.2 oz 295 lb 6.4 oz 293 lb 6.4 oz  Weight (kg) 128.6 kg 133.993 kg 133.085 kg     Body mass index is 37.4 kg/m.  General:  Well nourished, well developed, in no acute distress HEENT: normal Lymph: no adenopathy Neck: no JVD Endocrine:  No thryomegaly Vascular: No carotid bruits; FA pulses 2+ bilaterally without bruits  Cardiac:  normal S1, S2; RRR; no murmur  Lungs:  clear to auscultation bilaterally, no wheezing, rhonchi or rales  Abd: soft, nontender, no hepatomegaly  Ext: no edema Musculoskeletal:  No deformities, BUE and BLE strength normal and equal Skin: warm and dry  Neuro:  CNs 2-12 intact, no focal abnormalities noted Psych:  Normal affect   EKG:  The EKG was personally reviewed and demonstrates:  nsr with RBBB Telemetry:  Telemetry was personally reviewed and demonstrates:  nsr  Relevant CV Studies: Prior left heart cath 10/21 reviewed  Laboratory Data:  High Sensitivity Troponin:   Recent Labs  Lab 11/16/20 2039 11/16/20 2240 11/17/20 0618 11/17/20 1052 11/17/20 1445  TROPONINIHS 8 8 63* 93* 106*     Chemistry Recent Labs  Lab 11/16/20 2039 11/17/20 0345  NA 139 140  K 3.4* 3.8  CL 103 104  CO2 24 25  GLUCOSE 168* 91  BUN 11 12  CREATININE 0.88 0.78  CALCIUM 9.4 8.9  GFRNONAA >60 >60  ANIONGAP 12 11    No results for input(s): PROT, ALBUMIN, AST, ALT, ALKPHOS, BILITOT in the last 168 hours. Hematology Recent Labs  Lab 11/16/20 2039 11/17/20 0345  WBC 7.6 6.8  RBC 5.81 5.40  HGB 17.2* 16.0  HCT  53.3* 49.6  MCV 91.7 91.9  MCH 29.6 29.6  MCHC 32.3 32.3  RDW 12.2 12.2  PLT 204 187   BNP Recent Labs  Lab 11/17/20 0345  BNP 31.5    DDimer No results for input(s): DDIMER in the last 168 hours.   Radiology/Studies:  DG Chest 2 View  Result Date: 11/16/2020 CLINICAL DATA:  Substernal chest pain radiating to the right shoulder and back. Sweats and palpitations. History of multiple stents. EXAM: CHEST - 2 VIEW COMPARISON:  08/09/2020 FINDINGS: Cardiac pacemaker. Heart size and pulmonary vascularity are normal. Lungs are clear. No pleural  effusions. No pneumothorax. Mediastinal contours appear intact. IMPRESSION: No active cardiopulmonary disease. Electronically Signed   By: Burman Nieves M.D.   On: 11/16/2020 21:05   CT ANGIO CHEST PE W OR WO CONTRAST  Result Date: 11/16/2020 CLINICAL DATA:  Substernal chest pain EXAM: CT ANGIOGRAPHY CHEST WITH CONTRAST TECHNIQUE: Multidetector CT imaging of the chest was performed using the standard protocol during bolus administration of intravenous contrast. Multiplanar CT image reconstructions and MIPs were obtained to evaluate the vascular anatomy. CONTRAST:  79mL OMNIPAQUE IOHEXOL 350 MG/ML SOLN COMPARISON:  None. FINDINGS: Cardiovascular: There is slightly suboptimal opacification of the main pulmonary artery, however no central or proximal segmental pulmonary embolism is seen. The heart is normal in size. No pericardial effusion or thickening. No evidence right heart strain. There is normal three-vessel brachiocephalic anatomy without proximal stenosis. Coronary artery calcifications are seen. Mediastinum/Nodes: No hilar, mediastinal, or axillary adenopathy. Thyroid gland, trachea, and esophagus demonstrate no significant findings. A left-sided pacemaker is present. Lungs/Pleura: Minimal ground-glass opacities are seen at both lung bases. No pleural effusion or pneumothorax. No airspace consolidation. Upper Abdomen: Multiple low-density lesions  are seen throughout the liver parenchyma. Tiny calcifications seen within the gallbladder. Musculoskeletal: No chest wall abnormality. No acute or significant osseous findings. Review of the MIP images confirms the above findings. IMPRESSION: Slightly suboptimal opacification of the main pulmonary artery, however no central or proximal segmental pulmonary embolism. No acute intrathoracic pathology to explain the patient's symptoms. Nonspecific low-density lesions within the liver. Cholelithiasis Electronically Signed   By: Jonna Clark M.D.   On: 11/16/2020 23:53     Assessment and Plan:   1. [place] - he has had identical symptoms compared to his prior episodes of [place]. He is now pain free. He will undergo left heart cath with possible PCI on Monday. 2. Obesity  - he will need to be encouraged to lose weight. 3. HTN -his bp is controlled.       TIMI Risk Score for Unstable Angina or Non-ST Elevation MI:   The patient's TIMI risk score is  , which indicates a  % risk of all cause mortality, new or recurrent myocardial infarction or need for urgent revascularization in the next 14 days.  For questions or updates, please contact [facility] Please consult [email or web address] for contact info under    Signed, Lewayne Bunting, MD  11/17/2020 4:27 PM

## 2020-11-18 DIAGNOSIS — Z7902 Long term (current) use of antithrombotics/antiplatelets: Secondary | ICD-10-CM | POA: Diagnosis not present

## 2020-11-18 DIAGNOSIS — Z6837 Body mass index (BMI) 37.0-37.9, adult: Secondary | ICD-10-CM | POA: Diagnosis not present

## 2020-11-18 DIAGNOSIS — I2510 Atherosclerotic heart disease of native coronary artery without angina pectoris: Secondary | ICD-10-CM | POA: Diagnosis not present

## 2020-11-18 DIAGNOSIS — Z8249 Family history of ischemic heart disease and other diseases of the circulatory system: Secondary | ICD-10-CM | POA: Diagnosis not present

## 2020-11-18 DIAGNOSIS — I25110 Atherosclerotic heart disease of native coronary artery with unstable angina pectoris: Secondary | ICD-10-CM | POA: Diagnosis present

## 2020-11-18 DIAGNOSIS — Z20822 Contact with and (suspected) exposure to covid-19: Secondary | ICD-10-CM | POA: Diagnosis present

## 2020-11-18 DIAGNOSIS — E8770 Fluid overload, unspecified: Secondary | ICD-10-CM | POA: Diagnosis not present

## 2020-11-18 DIAGNOSIS — I480 Paroxysmal atrial fibrillation: Secondary | ICD-10-CM | POA: Diagnosis present

## 2020-11-18 DIAGNOSIS — R079 Chest pain, unspecified: Secondary | ICD-10-CM | POA: Diagnosis present

## 2020-11-18 DIAGNOSIS — E669 Obesity, unspecified: Secondary | ICD-10-CM | POA: Diagnosis present

## 2020-11-18 DIAGNOSIS — R0789 Other chest pain: Secondary | ICD-10-CM | POA: Diagnosis present

## 2020-11-18 DIAGNOSIS — E785 Hyperlipidemia, unspecified: Secondary | ICD-10-CM | POA: Diagnosis present

## 2020-11-18 DIAGNOSIS — I249 Acute ischemic heart disease, unspecified: Principal | ICD-10-CM

## 2020-11-18 DIAGNOSIS — Z79899 Other long term (current) drug therapy: Secondary | ICD-10-CM | POA: Diagnosis not present

## 2020-11-18 DIAGNOSIS — I119 Hypertensive heart disease without heart failure: Secondary | ICD-10-CM | POA: Diagnosis present

## 2020-11-18 DIAGNOSIS — Z955 Presence of coronary angioplasty implant and graft: Secondary | ICD-10-CM | POA: Diagnosis not present

## 2020-11-18 DIAGNOSIS — Z713 Dietary counseling and surveillance: Secondary | ICD-10-CM | POA: Diagnosis not present

## 2020-11-18 DIAGNOSIS — F40240 Claustrophobia: Secondary | ICD-10-CM | POA: Diagnosis present

## 2020-11-18 DIAGNOSIS — D6869 Other thrombophilia: Secondary | ICD-10-CM | POA: Diagnosis present

## 2020-11-18 DIAGNOSIS — Z950 Presence of cardiac pacemaker: Secondary | ICD-10-CM | POA: Diagnosis not present

## 2020-11-18 DIAGNOSIS — Z7982 Long term (current) use of aspirin: Secondary | ICD-10-CM | POA: Diagnosis not present

## 2020-11-18 LAB — CBC
HCT: 46.4 % (ref 39.0–52.0)
Hemoglobin: 15.9 g/dL (ref 13.0–17.0)
MCH: 30.6 pg (ref 26.0–34.0)
MCHC: 34.3 g/dL (ref 30.0–36.0)
MCV: 89.4 fL (ref 80.0–100.0)
Platelets: 190 10*3/uL (ref 150–400)
RBC: 5.19 MIL/uL (ref 4.22–5.81)
RDW: 12.1 % (ref 11.5–15.5)
WBC: 6 10*3/uL (ref 4.0–10.5)
nRBC: 0 % (ref 0.0–0.2)

## 2020-11-18 LAB — HEPARIN LEVEL (UNFRACTIONATED)
Heparin Unfractionated: 0.74 IU/mL — ABNORMAL HIGH (ref 0.30–0.70)
Heparin Unfractionated: 1.1 IU/mL — ABNORMAL HIGH (ref 0.30–0.70)
Heparin Unfractionated: 0.52 IU/mL (ref 0.30–0.70)

## 2020-11-18 MED ORDER — SODIUM CHLORIDE 0.9 % WEIGHT BASED INFUSION
3.0000 mL/kg/h | INTRAVENOUS | Status: AC
  Administered 2020-11-19: 3 mL/kg/h via INTRAVENOUS

## 2020-11-18 MED ORDER — ASPIRIN 81 MG PO CHEW
81.0000 mg | CHEWABLE_TABLET | ORAL | Status: AC
  Administered 2020-11-19: 81 mg via ORAL
  Filled 2020-11-18: qty 1

## 2020-11-18 MED ORDER — SODIUM CHLORIDE 0.9% FLUSH: 3.0000 mL | INTRAVENOUS | Status: DC | PRN

## 2020-11-18 MED ORDER — SODIUM CHLORIDE 0.9 % IV SOLN: 250.0000 mL | INTRAVENOUS | Status: DC | PRN

## 2020-11-18 MED ORDER — SODIUM CHLORIDE 0.9 % WEIGHT BASED INFUSION
1.0000 mL/kg/h | INTRAVENOUS | Status: DC
  Administered 2020-11-19: 1 mL/kg/h via INTRAVENOUS

## 2020-11-18 MED ORDER — HEPARIN (PORCINE) 25000 UT/250ML-% IV SOLN
1100.0000 [IU]/h | INTRAVENOUS | Status: DC
  Administered 2020-11-18 – 2020-11-19 (×2): 1150 [IU]/h via INTRAVENOUS
  Filled 2020-11-18 (×2): qty 250

## 2020-11-18 MED ORDER — SODIUM CHLORIDE 0.9% FLUSH
3.0000 mL | Freq: Two times a day (BID) | INTRAVENOUS | Status: DC
  Administered 2020-11-18 – 2020-11-22 (×4): 3 mL via INTRAVENOUS

## 2020-11-18 NOTE — Progress Notes (Signed)
ANTICOAGULATION CONSULT NOTE - Follow Up Consult  Pharmacy Consult for heparin Indication: chest pain/ACS  No Known Allergies  Patient Measurements: Height: 6\' 1"  (185.4 cm) Weight: 128.6 kg (283 lb 8.2 oz) IBW/kg (Calculated) : 79.9 Heparin Dosing Weight: 108.5 kg  Vital Signs: Temp: 98.4 F (36.9 C) (01/02 0724) Temp Source: Oral (01/02 0724) BP: 156/90 (01/02 0813) Pulse Rate: 63 (01/02 0813)  Labs: Recent Labs    11/16/20 2039 11/16/20 2240 11/17/20 0345 11/17/20 0618 11/17/20 1052 11/17/20 1445 11/17/20 1717 11/18/20 0143  HGB 17.2*  --  16.0  --   --   --   --  15.9  HCT 53.3*  --  49.6  --   --   --   --  46.4  PLT 204  --  187  --   --   --   --  190  HEPARINUNFRC  --   --   --   --   --   --   --  0.74*  CREATININE 0.88  --  0.78  --   --   --   --   --   TROPONINIHS 8   < >  --    < > 93* 106* 123*  --    < > = values in this interval not displayed.    Estimated Creatinine Clearance: 131.2 mL/min (by C-G formula based on SCr of 0.78 mg/dL).   Medications:  Medications Prior to Admission  Medication Sig Dispense Refill Last Dose  . aspirin EC 81 MG tablet Take 81 mg by mouth daily. Swallow whole.   11/16/2020 at Unknown time  . atorvastatin (LIPITOR) 80 MG tablet Take 80 mg by mouth at bedtime.    11/15/2020  . famotidine (PEPCID) 20 MG tablet TAKE ONE-HALF TABLET BY MOUTH 2 TIMES DAILY AS NEEDED FOR HEARTBURN OR INDIGESTION. (Patient taking differently: Take 10 mg by mouth 2 (two) times daily as needed for heartburn or indigestion.) 30 tablet 1 11/16/2020 at Unknown time  . metoprolol succinate (TOPROL-XL) 25 MG 24 hr tablet Take 25 mg by mouth daily.   11/16/2020 at 0800  . nitroGLYCERIN (NITROSTAT) 0.4 MG SL tablet Place 1 tablet (0.4 mg total) under the tongue every 5 (five) minutes as needed for chest pain. 25 tablet 3 11/16/2020 at Unknown time  . prasugrel (EFFIENT) 10 MG TABS tablet Take 10 mg by mouth daily.   11/16/2020 at Unknown time    Scheduled:  . aspirin EC  81 mg Oral Daily  . atorvastatin  80 mg Oral QHS  . metoprolol succinate  25 mg Oral Daily  . pantoprazole  40 mg Oral Daily  . prasugrel  10 mg Oral Daily   Infusions:  . heparin 1,500 Units/hr (11/18/20 0734)   PRN:  Anti-infectives (From admission, onward)   None      Assessment: 65 yo male with a history of CAD s/p 30 stents, paroxysmal atrial fibrillation, and HTN presents with unstable angina. PTA the patient is not on anticoagulation. The pt is planned for a left heart cath with possible PCI on 1/3. Pharmacy is consulted to dose heparin.  Heparin level is supratherapeutic at 1.10 while running at 1500 units/hr. Per the RN, there were no issues with the infusion and the patient is without signs or symptoms of bleeding. CBC is WNL and stable.  Goal of Therapy:  Heparin level 0.3-0.7 units/ml Monitor platelets by anticoagulation protocol: Yes   Plan:  - HOLD the heparin infusion x  1 hour - Resume heparin IV at 1150 units/hr after the 1 hour hold - Obtain a 6 hour heparin level - Monitor daily heparin level and CBC - Monitor for signs and symptoms of bleeding   Shauna Hugh, PharmD, [place]  PGY-1 Pharmacy Resident 11/18/2020 8:51 AM  Please check [email or web address] for unit-specific pharmacy phone numbers.

## 2020-11-18 NOTE — Progress Notes (Signed)
ANTICOAGULATION CONSULT NOTE  Pharmacy Consult for Heparin Indication: chest pain/ACS  No Known Allergies  Patient Measurements: Height: 6\' 1"  (185.4 cm) Weight: 128.6 kg (283 lb 8.2 oz) IBW/kg (Calculated) : 79.9 Heparin Dosing Weight: 112 kg  Vital Signs: Temp: 97.9 F (36.6 C) (01/01 2333) Temp Source: Oral (01/01 2333) BP: 119/65 (01/01 2333) Pulse Rate: 54 (01/01 2333)  Labs: Recent Labs    11/16/20 2039 11/16/20 2240 11/17/20 0345 11/17/20 0618 11/17/20 1052 11/17/20 1445 11/17/20 1717 11/18/20 0143  HGB 17.2*  --  16.0  --   --   --   --  15.9  HCT 53.3*  --  49.6  --   --   --   --  46.4  PLT 204  --  187  --   --   --   --  190  HEPARINUNFRC  --   --   --   --   --   --   --  0.74*  CREATININE 0.88  --  0.78  --   --   --   --   --   TROPONINIHS 8   < >  --    < > 93* 106* 123*  --    < > = values in this interval not displayed.    Estimated Creatinine Clearance: 131.2 mL/min (by C-G formula based on SCr of 0.78 mg/dL).  Assessment: 65 y.o. male with chest pain for heparin   Goal of Therapy:  Heparin level 0.3-0.7 units/ml Monitor platelets by anticoagulation protocol: Yes   Plan:  Decrease Heparin 1500 units/hr Check heparin level in 8 hours.   77, PharmD, BCPS  11/18/2020,3:12 AM

## 2020-11-18 NOTE — Progress Notes (Signed)
Family Medicine Teaching Service Daily Progress Note Intern Pager: [phone]  Patient name: Ryan Clarke Medical record number: [id number] Date of birth: 1956-05-14 Age: 65 y.o. Gender: male  Primary Care Provider: [name], MD Consultants: Cardiology Code Status: Full  Pt Overview and Major Events to Date:  11/17/2020- admitted for unstable angina  Assessment and Plan: Ryan Clarke is a 65 y.o. male admitted for unstable angina and awaiting left heart cath. PMH is significant for CAD s/p 30 stents, PAF, and HTN.  Unstable Angina  H/o extensive CAD and multiple stents Patient reports improvement in chest pain. He says that yesterday it felt like a pressure sitting behind his sternum that did not improve with nitroglycerin. HS troponin continued to mildly rise yesterday [id number]. He has previously had 29 stents, most recently in 10/21, and his symptoms are usually GI in nature. In this setting, cardiology recommends left heart catheterization on Monday, 11/19/20, started heparin drip. -Appreciate cardiology recommendations -Continue DAPT and lipitor 80 mg -Protonix 40mg   -Cardiac monitoring -VS per protocol -Heparin for DVT PPX as well as ACS tx -Heart healthy diet  HTN Chronic and stable. Patient has been well controlled overall, with mild hypertension this morning to 150s/90s. Home medication is metoprolol 25 mg. Given mostly normotensive readings and only mild elevation, recommend not over-treating in acute setting and continue to follow trend. -Continue metoprolol 25 mg   PAF Patient has dual chamber pacemaker. Patient was noted to be in A. Fib in October 2021 during placement of last stent, which was new. He subsequently underwent heart cath and atrial fibrillation resolved.  DOAC was mentioned in cardiology follow up, but not started. Patient reports that since AF resolved with stenting, thought to be due to blockage at that time. CHADS2-VAS2C score is 3 for age, HTN, and  CAD, which is moderate-high risk and should consider anticoagulation. HAS BLED score is 2 for DAPT and age, relatively low. Patient has been sinus brady during this admission and no evidence of recurrent atrial fibrillation. Recommend patient follow up with cardiology as outpatient. -Continue metoprolol as above - Follow up with cardiologist to discuss DOAC  FEN/GI: heart healthy diet PPx: heparin  Status is: Observation  The patient will require care spanning > 2 midnights and should be moved to inpatient because: Ongoing diagnostic testing needed not appropriate for outpatient work up  Dispo: The patient is from: Home              Anticipated d/c is to: Home              Anticipated d/c date is: 1 day              Patient currently is not medically stable to d/c.  Subjective:  Patient has improved chest pain today, somewhat anxious about cath as he has claustrophobia.  Objective: Temp:  [97.6 F (36.4 C)-98.3 F (36.8 C)] 97.9 F (36.6 C) (01/01 2333) Pulse Rate:  [49-72] 54 (01/01 2333) Resp:  [16-20] 20 (01/01 2333) BP: (119-138)/(65-96) 119/65 (01/01 2333) SpO2:  [93 %-100 %] 94 % (01/01 2333) Weight:  [128.6 kg] 128.6 kg (01/01 0430) Physical Exam: General: age-appropriate WM, NAD, WNWD Cardiovascular: RRR, no m/r/g Respiratory: CTAB, no increased WOB Abdomen: soft, normoactive BS Extremities: no peripheral edema  Laboratory: Recent Labs  Lab 11/16/20 2039 11/17/20 0345  WBC 7.6 6.8  HGB 17.2* 16.0  HCT 53.3* 49.6  PLT 204 187   Recent Labs  Lab 11/16/20 2039 11/17/20 0345  NA 139 140  K 3.4* 3.8  CL 103 104  CO2 24 25  BUN 11 12  CREATININE 0.88 0.78  CALCIUM 9.4 8.9  GLUCOSE 168* 91    Imaging/Diagnostic Tests: No results found.  Shirlean Mylar, MD 11/18/2020, 2:01 AM PGY-2, [facility] Family Medicine FPTS Intern pager: [phone], text pages welcome

## 2020-11-18 NOTE — Progress Notes (Signed)
Progress Note  Patient Name: Ryan Clarke Date of Encounter: 11/18/2020  Primary Cardiologist: Buford Dresser, MD   Subjective   Denies chest pain or sob or indigestion.  Inpatient Medications    Scheduled Meds: . aspirin EC  81 mg Oral Daily  . atorvastatin  80 mg Oral QHS  . metoprolol succinate  25 mg Oral Daily  . pantoprazole  40 mg Oral Daily  . prasugrel  10 mg Oral Daily   Continuous Infusions: . heparin 1,500 Units/hr (11/18/20 0734)   PRN Meds:    Vital Signs    Vitals:   11/17/20 2333 11/18/20 0500 11/18/20 0724 11/18/20 0813  BP: 119/65 122/83 (!) 159/95 (!) 156/90  Pulse: (!) 54 (!) 55 (!) 55 63  Resp: 20 20 18    Temp: 97.9 F (36.6 C) 97.8 F (36.6 C) 98.4 F (36.9 C)   TempSrc: Oral Oral Oral   SpO2: 94% 96% 93%   Weight:      Height:        Intake/Output Summary (Last 24 hours) at 11/18/2020 1006 Last data filed at 11/18/2020 0500 Gross per 24 hour  Intake 422.31 ml  Output --  Net 422.31 ml   Filed Weights   11/17/20 0430  Weight: 128.6 kg    Telemetry    nsr - Personally Reviewed  ECG    none - Personally Reviewed  Physical Exam   GEN: obese no acute distress.   Neck: 6 cm JVD Cardiac: RRR, no murmurs, rubs, or gallops.  Respiratory: Clear to auscultation bilaterally. GI: Soft, nontender, non-distended  MS: No edema; No deformity. Neuro:  Nonfocal  Psych: Normal affect   Labs    Chemistry Recent Labs  Lab 11/16/20 2039 11/17/20 0345  NA 139 140  K 3.4* 3.8  CL 103 104  CO2 24 25  GLUCOSE 168* 91  BUN 11 12  CREATININE 0.88 0.78  CALCIUM 9.4 8.9  GFRNONAA >60 >60  ANIONGAP 12 11     Hematology Recent Labs  Lab 11/16/20 2039 11/17/20 0345 11/18/20 0143  WBC 7.6 6.8 6.0  RBC 5.81 5.40 5.19  HGB 17.2* 16.0 15.9  HCT 53.3* 49.6 46.4  MCV 91.7 91.9 89.4  MCH 29.6 29.6 30.6  MCHC 32.3 32.3 34.3  RDW 12.2 12.2 12.1  PLT 204 187 190    Cardiac EnzymesNo results for input(s): TROPONINI in the  last 168 hours. No results for input(s): TROPIPOC in the last 168 hours.   BNP Recent Labs  Lab 11/17/20 0345  BNP 31.5     DDimer No results for input(s): DDIMER in the last 168 hours.   Radiology    DG Chest 2 View  Result Date: 11/16/2020 CLINICAL DATA:  Substernal chest pain radiating to the right shoulder and back. Sweats and palpitations. History of multiple stents. EXAM: CHEST - 2 VIEW COMPARISON:  08/09/2020 FINDINGS: Cardiac pacemaker. Heart size and pulmonary vascularity are normal. Lungs are clear. No pleural effusions. No pneumothorax. Mediastinal contours appear intact. IMPRESSION: No active cardiopulmonary disease. Electronically Signed   By: Lucienne Capers M.D.   On: 11/16/2020 21:05   CT ANGIO CHEST PE W OR WO CONTRAST  Result Date: 11/16/2020 CLINICAL DATA:  Substernal chest pain EXAM: CT ANGIOGRAPHY CHEST WITH CONTRAST TECHNIQUE: Multidetector CT imaging of the chest was performed using the standard protocol during bolus administration of intravenous contrast. Multiplanar CT image reconstructions and MIPs were obtained to evaluate the vascular anatomy. CONTRAST:  62mL OMNIPAQUE IOHEXOL 350 MG/ML SOLN  COMPARISON:  None. FINDINGS: Cardiovascular: There is slightly suboptimal opacification of the main pulmonary artery, however no central or proximal segmental pulmonary embolism is seen. The heart is normal in size. No pericardial effusion or thickening. No evidence right heart strain. There is normal three-vessel brachiocephalic anatomy without proximal stenosis. Coronary artery calcifications are seen. Mediastinum/Nodes: No hilar, mediastinal, or axillary adenopathy. Thyroid gland, trachea, and esophagus demonstrate no significant findings. A left-sided pacemaker is present. Lungs/Pleura: Minimal ground-glass opacities are seen at both lung bases. No pleural effusion or pneumothorax. No airspace consolidation. Upper Abdomen: Multiple low-density lesions are seen throughout the  liver parenchyma. Tiny calcifications seen within the gallbladder. Musculoskeletal: No chest wall abnormality. No acute or significant osseous findings. Review of the MIP images confirms the above findings. IMPRESSION: Slightly suboptimal opacification of the main pulmonary artery, however no central or proximal segmental pulmonary embolism. No acute intrathoracic pathology to explain the patient's symptoms. Nonspecific low-density lesions within the liver. Cholelithiasis Electronically Signed   By: Jonna Clark M.D.   On: 11/16/2020 23:53    Cardiac Studies   none  Patient Profile     65 y.o. male admitted with indigestion (his anginal equivalent) s/p recent PCI 3 months ago.  Assessment & Plan    1. [place] - he is pain/indigestion free. I have reviewed his cath films. He had a POBA in October and I am concerned about restenosis. I have recommended he undergo repeat cath/possible PCI. 2. Dyslipidemia - his cholesterol is well controlled on high dose lipitor.  3. Obesity - he will be encouraged to increase activity and to lose weight.     For questions or updates, please contact [facility] Please consult [email or web address] for contact info under Cardiology/STEMI.      Signed, Lewayne Bunting, MD  11/18/2020, 10:06 AM  Patient ID: Ryan Clarke, male   DOB: 05-04-1956, 65 y.o.   MRN: [id number]

## 2020-11-18 NOTE — Progress Notes (Signed)
ANTICOAGULATION CONSULT NOTE - Follow Up Consult  Pharmacy Consult for heparin Indication: chest pain/ACS  No Known Allergies  Patient Measurements: Height: 6\' 1"  (185.4 cm) Weight: 128.6 kg (283 lb 8.2 oz) IBW/kg (Calculated) : 79.9 Heparin Dosing Weight: 108.5 kg  Vital Signs: Temp: 98 F (36.7 C) (01/02 2022) Temp Source: Oral (01/02 2022) BP: 136/85 (01/02 2022) Pulse Rate: 67 (01/02 2022)  Labs: Recent Labs    11/16/20 2039 11/16/20 2240 11/17/20 0345 11/17/20 0618 11/17/20 1052 11/17/20 1445 11/17/20 1717 11/18/20 0143 11/18/20 1016 11/18/20 2106  HGB 17.2*  --  16.0  --   --   --   --  15.9  --   --   HCT 53.3*  --  49.6  --   --   --   --  46.4  --   --   PLT 204  --  187  --   --   --   --  190  --   --   HEPARINUNFRC  --   --   --   --   --   --   --  0.74* 1.10* 0.52  CREATININE 0.88  --  0.78  --   --   --   --   --   --   --   TROPONINIHS 8   < >  --    < > 93* 106* 123*  --   --   --    < > = values in this interval not displayed.    Estimated Creatinine Clearance: 131.2 mL/min (by C-G formula based on SCr of 0.78 mg/dL).   Medications:  Medications Prior to Admission  Medication Sig Dispense Refill Last Dose  . aspirin EC 81 MG tablet Take 81 mg by mouth daily. Swallow whole.   11/16/2020 at Unknown time  . atorvastatin (LIPITOR) 80 MG tablet Take 80 mg by mouth at bedtime.    11/15/2020  . famotidine (PEPCID) 20 MG tablet TAKE ONE-HALF TABLET BY MOUTH 2 TIMES DAILY AS NEEDED FOR HEARTBURN OR INDIGESTION. (Patient taking differently: Take 10 mg by mouth 2 (two) times daily as needed for heartburn or indigestion.) 30 tablet 1 11/16/2020 at Unknown time  . metoprolol succinate (TOPROL-XL) 25 MG 24 hr tablet Take 25 mg by mouth daily.   11/16/2020 at 0800  . nitroGLYCERIN (NITROSTAT) 0.4 MG SL tablet Place 1 tablet (0.4 mg total) under the tongue every 5 (five) minutes as needed for chest pain. 25 tablet 3 11/16/2020 at Unknown time  . prasugrel  (EFFIENT) 10 MG TABS tablet Take 10 mg by mouth daily.   11/16/2020 at Unknown time   Scheduled:  . [START ON 11/19/2020] aspirin  81 mg Oral Pre-Cath  . aspirin EC  81 mg Oral Daily  . atorvastatin  80 mg Oral QHS  . metoprolol succinate  25 mg Oral Daily  . pantoprazole  40 mg Oral Daily  . prasugrel  10 mg Oral Daily  . sodium chloride flush  3 mL Intravenous Q12H   Infusions:  . sodium chloride    . [START ON 11/19/2020] sodium chloride     Followed by  . [START ON 11/19/2020] sodium chloride    . heparin 1,150 Units/hr (11/18/20 1236)   PRN:  Anti-infectives (From admission, onward)   None      Assessment: 65 yo male with a history of CAD s/p 30 stents, paroxysmal atrial fibrillation, and HTN presents with unstable angina. PTA the patient is  not on anticoagulation. The pt is planned for a left heart cath with possible PCI on 1/3. Pharmacy is consulted to dose heparin. -heparin level at goal on 1150 units/hr  Goal of Therapy:  Heparin level 0.3-0.7 units/ml Monitor platelets by anticoagulation protocol: Yes   Plan:  -Continue heparin at 1150 units/hr - Monitor daily heparin level and CBC  Hildred Laser, PharmD Clinical Pharmacist **Pharmacist phone directory can now be found on [email or web address] ([email or web address]).  Listed under [place].

## 2020-11-19 ENCOUNTER — Encounter (HOSPITAL_COMMUNITY): Payer: Self-pay | Admitting: Family Medicine

## 2020-11-19 ENCOUNTER — Encounter (HOSPITAL_COMMUNITY)
Admission: EM | Disposition: A | Discharge status: Home / Self Care | Payer: Self-pay | Source: Home / Self Care | Attending: Family Medicine

## 2020-11-19 ENCOUNTER — Inpatient Hospital Stay (HOSPITAL_COMMUNITY): Payer: Medicare HMO

## 2020-11-19 DIAGNOSIS — I25110 Atherosclerotic heart disease of native coronary artery with unstable angina pectoris: Secondary | ICD-10-CM

## 2020-11-19 DIAGNOSIS — I480 Paroxysmal atrial fibrillation: Secondary | ICD-10-CM

## 2020-11-19 DIAGNOSIS — I2510 Atherosclerotic heart disease of native coronary artery without angina pectoris: Secondary | ICD-10-CM

## 2020-11-19 DIAGNOSIS — E785 Hyperlipidemia, unspecified: Secondary | ICD-10-CM

## 2020-11-19 HISTORY — PX: LEFT HEART CATH AND CORONARY ANGIOGRAPHY: CATH118249

## 2020-11-19 LAB — CBC
HCT: 47.7 % (ref 39.0–52.0)
Hemoglobin: 15.8 g/dL (ref 13.0–17.0)
MCH: 29.8 pg (ref 26.0–34.0)
MCHC: 33.1 g/dL (ref 30.0–36.0)
MCV: 90 fL (ref 80.0–100.0)
Platelets: 169 10*3/uL (ref 150–400)
RBC: 5.3 MIL/uL (ref 4.22–5.81)
RDW: 11.9 % (ref 11.5–15.5)
WBC: 6.5 10*3/uL (ref 4.0–10.5)
nRBC: 0 % (ref 0.0–0.2)

## 2020-11-19 LAB — BASIC METABOLIC PANEL
Anion gap: 8 (ref 5–15)
BUN: 11 mg/dL (ref 8–23)
CO2: 23 mmol/L (ref 22–32)
Calcium: 8.9 mg/dL (ref 8.9–10.3)
Chloride: 106 mmol/L (ref 98–111)
Creatinine, Ser: 0.78 mg/dL (ref 0.61–1.24)
GFR, Estimated: >60 mL/min (ref >60)
Glucose, Bld: 100 mg/dL — ABNORMAL HIGH (ref 70–99)
Potassium: 3.6 mmol/L (ref 3.5–5.1)
Sodium: 137 mmol/L (ref 135–145)

## 2020-11-19 LAB — ECHOCARDIOGRAM COMPLETE
Area-P 1/2: 1.76 cm2
Height: 73 in
S' Lateral: 3.6 cm
Weight: 4560.88 oz

## 2020-11-19 LAB — HEPARIN LEVEL (UNFRACTIONATED): Heparin Unfractionated: 0.62 IU/mL (ref 0.30–0.70)

## 2020-11-19 SURGERY — LEFT HEART CATH AND CORONARY ANGIOGRAPHY
Anesthesia: LOCAL

## 2020-11-19 MED ORDER — VERAPAMIL HCL 2.5 MG/ML IV SOLN
INTRAVENOUS | Status: DC | PRN
  Administered 2020-11-19: 10 mL via INTRA_ARTERIAL

## 2020-11-19 MED ORDER — LABETALOL HCL 5 MG/ML IV SOLN: 10.0000 mg | INTRAVENOUS | Status: AC | PRN

## 2020-11-19 MED ORDER — MIDAZOLAM HCL 2 MG/2ML IJ SOLN
INTRAMUSCULAR | Status: DC | PRN
  Administered 2020-11-19: 2 mg via INTRAVENOUS
  Administered 2020-11-19: 1 mg via INTRAVENOUS

## 2020-11-19 MED ORDER — SODIUM CHLORIDE 0.9% FLUSH: 3.0000 mL | INTRAVENOUS | Status: DC | PRN

## 2020-11-19 MED ORDER — ASPIRIN 81 MG PO CHEW: 81.0000 mg | CHEWABLE_TABLET | Freq: Every day | ORAL | Status: DC

## 2020-11-19 MED ORDER — FENTANYL CITRATE (PF) 100 MCG/2ML IJ SOLN
INTRAMUSCULAR | Status: DC | PRN
  Administered 2020-11-19: 25 ug via INTRAVENOUS
  Administered 2020-11-19: 50 ug via INTRAVENOUS

## 2020-11-19 MED ORDER — SODIUM CHLORIDE 0.9% FLUSH
3.0000 mL | Freq: Two times a day (BID) | INTRAVENOUS | Status: DC
  Administered 2020-11-20 – 2020-11-25 (×5): 3 mL via INTRAVENOUS

## 2020-11-19 MED ORDER — HEPARIN SODIUM (PORCINE) 1000 UNIT/ML IJ SOLN
INTRAMUSCULAR | Status: DC | PRN
  Administered 2020-11-19: 6500 [IU] via INTRAVENOUS

## 2020-11-19 MED ORDER — SODIUM CHLORIDE 0.9 % IV SOLN: 250.0000 mL | INTRAVENOUS | Status: DC | PRN

## 2020-11-19 MED ORDER — ACETAMINOPHEN 325 MG PO TABS
650.0000 mg | ORAL_TABLET | ORAL | Status: DC | PRN
  Administered 2020-11-20 – 2020-11-24 (×2): 650 mg via ORAL
  Filled 2020-11-19 (×2): qty 2

## 2020-11-19 MED ORDER — IOHEXOL 350 MG/ML SOLN
INTRAVENOUS | Status: DC | PRN
  Administered 2020-11-19: 80 mL

## 2020-11-19 MED ORDER — HEPARIN (PORCINE) IN NACL 1000-0.9 UT/500ML-% IV SOLN
INTRAVENOUS | Status: DC | PRN
  Administered 2020-11-19: 500 mL

## 2020-11-19 MED ORDER — MIDAZOLAM HCL 2 MG/2ML IJ SOLN
INTRAMUSCULAR | Status: AC
  Filled 2020-11-19: qty 2

## 2020-11-19 MED ORDER — ONDANSETRON HCL 4 MG/2ML IJ SOLN: 4.0000 mg | Freq: Four times a day (QID) | INTRAMUSCULAR | Status: DC | PRN

## 2020-11-19 MED ORDER — LIDOCAINE HCL (PF) 1 % IJ SOLN
INTRAMUSCULAR | Status: DC | PRN
  Administered 2020-11-19: 8 mL

## 2020-11-19 MED ORDER — VERAPAMIL HCL 2.5 MG/ML IV SOLN
INTRAVENOUS | Status: AC
  Filled 2020-11-19: qty 2

## 2020-11-19 MED ORDER — HEPARIN (PORCINE) 25000 UT/250ML-% IV SOLN
1250.0000 [IU]/h | INTRAVENOUS | Status: DC
  Administered 2020-11-20 – 2020-11-22 (×4): 1100 [IU]/h via INTRAVENOUS
  Administered 2020-11-23 (×2): 1150 [IU]/h via INTRAVENOUS
  Administered 2020-11-24: 1200 [IU]/h via INTRAVENOUS
  Administered 2020-11-25: 1250 [IU]/h via INTRAVENOUS
  Filled 2020-11-19 (×8): qty 250

## 2020-11-19 MED ORDER — HYDRALAZINE HCL 20 MG/ML IJ SOLN: 10.0000 mg | INTRAMUSCULAR | Status: AC | PRN

## 2020-11-19 MED ORDER — LIDOCAINE HCL (PF) 1 % IJ SOLN
INTRAMUSCULAR | Status: AC
  Filled 2020-11-19: qty 30

## 2020-11-19 MED ORDER — METOPROLOL SUCCINATE ER 50 MG PO TB24
50.0000 mg | ORAL_TABLET | Freq: Every day | ORAL | Status: DC
  Administered 2020-11-19 – 2020-11-20 (×2): 50 mg via ORAL
  Filled 2020-11-19 (×3): qty 1

## 2020-11-19 MED ORDER — ISOSORBIDE MONONITRATE ER 30 MG PO TB24
30.0000 mg | ORAL_TABLET | Freq: Every day | ORAL | Status: DC
  Administered 2020-11-19 – 2020-11-25 (×7): 30 mg via ORAL
  Filled 2020-11-19 (×7): qty 1

## 2020-11-19 MED ORDER — SODIUM CHLORIDE 0.9 % IV SOLN: INTRAVENOUS | Status: DC

## 2020-11-19 MED ORDER — HEPARIN (PORCINE) IN NACL 1000-0.9 UT/500ML-% IV SOLN
INTRAVENOUS | Status: AC
  Filled 2020-11-19: qty 1000

## 2020-11-19 MED ORDER — HEPARIN SODIUM (PORCINE) 1000 UNIT/ML IJ SOLN
INTRAMUSCULAR | Status: AC
  Filled 2020-11-19: qty 1

## 2020-11-19 MED ORDER — DIAZEPAM 2 MG PO TABS: 5.0000 mg | ORAL_TABLET | ORAL | Status: DC | PRN

## 2020-11-19 MED ORDER — FENTANYL CITRATE (PF) 100 MCG/2ML IJ SOLN
INTRAMUSCULAR | Status: AC
  Filled 2020-11-19: qty 2

## 2020-11-19 SURGICAL SUPPLY — 11 items
CATH OPTITORQUE TIG 4.0 5F (CATHETERS) ×2 IMPLANT
DEVICE RAD COMP TR BAND LRG (VASCULAR PRODUCTS) ×2 IMPLANT
GLIDESHEATH SLEND SS 6F .021 (SHEATH) ×2 IMPLANT
GUIDEWIRE INQWIRE 1.5J.035X260 (WIRE) ×1 IMPLANT
INQWIRE 1.5J .035X260CM (WIRE) ×2
KIT HEART LEFT (KITS) ×2 IMPLANT
PACK CARDIAC CATHETERIZATION (CUSTOM PROCEDURE TRAY) ×2 IMPLANT
SET INTRODUCER MICROPUNCT 5F (INTRODUCER) ×2 IMPLANT
SHEATH PROBE COVER 6X72 (BAG) ×2 IMPLANT
TRANSDUCER W/STOPCOCK (MISCELLANEOUS) ×2 IMPLANT
TUBING CIL FLEX 10 FLL-RA (TUBING) ×2 IMPLANT

## 2020-11-19 NOTE — Progress Notes (Signed)
Echocardiogram 2D Echocardiogram has been performed.  Ryan Clarke 11/19/2020, 4:18 PM

## 2020-11-19 NOTE — Progress Notes (Addendum)
Progress Note  Patient Name: Ryan Clarke Date of Encounter: 11/19/2020  [facility] Cardiologist: Buford Dresser, MD   Subjective   No chest pain.   Inpatient Medications    Scheduled Meds: . aspirin EC  81 mg Oral Daily  . atorvastatin  80 mg Oral QHS  . metoprolol succinate  25 mg Oral Daily  . pantoprazole  40 mg Oral Daily  . prasugrel  10 mg Oral Daily  . sodium chloride flush  3 mL Intravenous Q12H   Continuous Infusions: . sodium chloride    . sodium chloride 1 mL/kg/hr (11/19/20 0551)  . heparin 1,150 Units/hr (11/19/20 0522)   PRN Meds: sodium chloride, sodium chloride flush   Vital Signs    Vitals:   11/18/20 0813 11/18/20 1100 11/18/20 2022 11/19/20 0508  BP: (!) 156/90 128/72 136/85 (!) 142/87  Pulse: 63 (!) 58 67 (!) 57  Resp:  18 18 16   Temp:  98.2 F (36.8 C) 98 F (36.7 C) 97.6 F (36.4 C)  TempSrc:  Oral Oral Oral  SpO2:  95% 91% 98%  Weight:    129.3 kg  Height:        Intake/Output Summary (Last 24 hours) at 11/19/2020 0807 Last data filed at 11/18/2020 2111 Gross per 24 hour  Intake 125.18 ml  Output --  Net 125.18 ml   Last 3 Weights 11/19/2020 11/17/2020 10/01/2020  Weight (lbs) 285 lb 0.9 oz 283 lb 8.2 oz 295 lb 6.4 oz  Weight (kg) 129.3 kg 128.6 kg 133.993 kg      Telemetry    SR with rate paced rhythm  - Personally Reviewed  ECG    N/A  Physical Exam   GEN: No acute distress.   Neck: No JVD Cardiac: RRR, no murmurs, rubs, or gallops.  Respiratory: Clear to auscultation bilaterally. GI: Soft, nontender, non-distended  MS: No edema; No deformity. Neuro:  Nonfocal  Psych: Normal affect   Labs    High Sensitivity Troponin:   Recent Labs  Lab 11/16/20 2240 11/17/20 0618 11/17/20 1052 11/17/20 1445 11/17/20 1717  TROPONINIHS 8 63* 93* 106* 123*      Chemistry Recent Labs  Lab 11/16/20 2039 11/17/20 0345 11/19/20 0225  NA 139 140 137  K 3.4* 3.8 3.6  CL 103 104 106  CO2 24 25 23   GLUCOSE 168* 91  100*  BUN 11 12 11   CREATININE 0.88 0.78 0.78  CALCIUM 9.4 8.9 8.9  GFRNONAA >60 >60 >60  ANIONGAP 12 11 8      Hematology Recent Labs  Lab 11/17/20 0345 11/18/20 0143 11/19/20 0225  WBC 6.8 6.0 6.5  RBC 5.40 5.19 5.30  HGB 16.0 15.9 15.8  HCT 49.6 46.4 47.7  MCV 91.9 89.4 90.0  MCH 29.6 30.6 29.8  MCHC 32.3 34.3 33.1  RDW 12.2 12.1 11.9  PLT 187 190 169    BNP Recent Labs  Lab 11/17/20 0345  BNP 31.5     Radiology    No results found.  Cardiac Studies   Pending cath   Patient Profile     65 y.o. male male with a hx of CAD s/p multiple stents with recently POBA in 10/21, s/p  pacemaker, PAF, HLD, HTN and strong family hx of CAD (multiple family member died in 45s after CABG) presented for Chest pain.   Assessment & Plan    1. [place] - Mixed symptoms. His prior anginal symptoms were abdominal discomfort. This time he has had chest pressure and nausea>>  Took SL nitro with improved symptoms but symptoms reoccur.  - Hs-troponin 8>>63>>93>>106>>123 - Treated with IV heparin - for LHC today.  - The patient understands that risks include but are not limited to stroke (1 in 1000), death (1 in 1000), kidney failure [usually temporary] (1 in 500), bleeding (1 in 200), allergic reaction [possibly serious] (1 in 200), and agrees to proceed.  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 HLD - 08/17/2020: Cholesterol, Total 96; HDL 32; LDL Chol Calc (NIH) 35; Triglycerides 174  - Continue Lipitor 80mg  qd  3. HTN - BP relatively stable - Continue BB   4. PAF - Found to device check in 08/2020. CHADSVASCs score of 2. Needs to discuss NOAC.   5. S/p PPM - Followed by Dr. 09/2020   For questions or updates, please contact [facility] Please consult [email or web address] for contact info under        Signed, Graciela Husbands, PA  11/19/2020, 8:07 AM    Agree with note by 01/17/2021 PA-C  Ryan Clarke was admitted with unstable angina.  His enzymes were minimally  elevated.  His EKG shows no acute changes.  He had complex PCI by Dr. Karin Lieu 09/14/2020 of a AV groove circumflex and obtuse marginal branch bifurcation using "kissing stent technique.  He had a total of 30 stents placed in his heart over the last 26 years up in the 09/16/2020 area.  The cath done in October revealed patent RCA stents, patent LAD stents with a 50% ostial LAD and subtotally occluded mid AV groove circumflex stent.  He is on dual antiplatelet therapy as well as high-dose statin therapy with LDL 35.  Has been pain-free since in the hospital IV heparin.  Plan for diagnostic cath later today.  November, M.D., FACP, [facility], [facility] [address] [place], [place]  [place]  [phone] 11/19/2020 10:01 AM

## 2020-11-19 NOTE — H&P (View-Only) (Signed)
Progress Note  Patient Name: Ryan Clarke Date of Encounter: 11/19/2020  [facility] Cardiologist: Buford Dresser, MD   Subjective   No chest pain.   Inpatient Medications    Scheduled Meds: . aspirin EC  81 mg Oral Daily  . atorvastatin  80 mg Oral QHS  . metoprolol succinate  25 mg Oral Daily  . pantoprazole  40 mg Oral Daily  . prasugrel  10 mg Oral Daily  . sodium chloride flush  3 mL Intravenous Q12H   Continuous Infusions: . sodium chloride    . sodium chloride 1 mL/kg/hr (11/19/20 0551)  . heparin 1,150 Units/hr (11/19/20 0522)   PRN Meds: sodium chloride, sodium chloride flush   Vital Signs    Vitals:   11/18/20 0813 11/18/20 1100 11/18/20 2022 11/19/20 0508  BP: (!) 156/90 128/72 136/85 (!) 142/87  Pulse: 63 (!) 58 67 (!) 57  Resp:  18 18 16   Temp:  98.2 F (36.8 C) 98 F (36.7 C) 97.6 F (36.4 C)  TempSrc:  Oral Oral Oral  SpO2:  95% 91% 98%  Weight:    129.3 kg  Height:        Intake/Output Summary (Last 24 hours) at 11/19/2020 0807 Last data filed at 11/18/2020 2111 Gross per 24 hour  Intake 125.18 ml  Output -  Net 125.18 ml   Last 3 Weights 11/19/2020 11/17/2020 10/01/2020  Weight (lbs) 285 lb 0.9 oz 283 lb 8.2 oz 295 lb 6.4 oz  Weight (kg) 129.3 kg 128.6 kg 133.993 kg      Telemetry    SR with rate paced rhythm  - Personally Reviewed  ECG    N/A  Physical Exam   GEN: No acute distress.   Neck: No JVD Cardiac: RRR, no murmurs, rubs, or gallops.  Respiratory: Clear to auscultation bilaterally. GI: Soft, nontender, non-distended  MS: No edema; No deformity. Neuro:  Nonfocal  Psych: Normal affect   Labs    High Sensitivity Troponin:   Recent Labs  Lab 11/16/20 2240 11/17/20 0618 11/17/20 1052 11/17/20 1445 11/17/20 1717  TROPONINIHS 8 63* 93* 106* 123*      Chemistry Recent Labs  Lab 11/16/20 2039 11/17/20 0345 11/19/20 0225  NA 139 140 137  K 3.4* 3.8 3.6  CL 103 104 106  CO2 24 25 23   GLUCOSE 168* 91  100*  BUN 11 12 11   CREATININE 0.88 0.78 0.78  CALCIUM 9.4 8.9 8.9  GFRNONAA >60 >60 >60  ANIONGAP 12 11 8      Hematology Recent Labs  Lab 11/17/20 0345 11/18/20 0143 11/19/20 0225  WBC 6.8 6.0 6.5  RBC 5.40 5.19 5.30  HGB 16.0 15.9 15.8  HCT 49.6 46.4 47.7  MCV 91.9 89.4 90.0  MCH 29.6 30.6 29.8  MCHC 32.3 34.3 33.1  RDW 12.2 12.1 11.9  PLT 187 190 169    BNP Recent Labs  Lab 11/17/20 0345  BNP 31.5     Radiology    No results found.  Cardiac Studies   Pending cath   Patient Profile     65 y.o. male male with a hx of CAD s/p multiple stents with recently POBA in 10/21, s/p  pacemaker, PAF, HLD, HTN and strong family hx of CAD (multiple family member died in 49s after CABG) presented for Chest pain.   Assessment & Plan    1. [place] - Mixed symptoms. His prior anginal symptoms were abdominal discomfort. This time he has had chest pressure and nausea>>  Took SL nitro with improved symptoms but symptoms reoccur.  - Hs-troponin 8>>63>>93>>106>>123 - Treated with IV heparin - for LHC today.  - The patient understands that risks include but are not limited to stroke (1 in 1000), death (1 in 1000), kidney failure [usually temporary] (1 in 500), bleeding (1 in 200), allergic reaction [possibly serious] (1 in 200), and agrees to proceed.  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 HLD - 08/17/2020: Cholesterol, Total 96; HDL 32; LDL Chol Calc (NIH) 35; Triglycerides 174  - Continue Lipitor 80mg  qd  3. HTN - BP relatively stable - Continue BB   4. PAF - Found to device check in 08/2020. CHADSVASCs score of 2. Needs to discuss NOAC.   5. S/p PPM - Followed by Dr. 09/2020   For questions or updates, please contact [facility] Please consult [email or web address] for contact info under        Signed, Graciela Husbands, PA  11/19/2020, 8:07 AM    Agree with note by 01/17/2021 PA-C  [name] was admitted with unstable angina.  His enzymes were minimally  elevated.  His EKG shows no acute changes.  He had complex PCI by Dr. Karin Lieu 09/14/2020 of a AV groove circumflex and obtuse marginal branch bifurcation using "kissing stent technique.  He had a total of 30 stents placed in his heart over the last 26 years up in the 09/16/2020 area.  The cath done in October revealed patent RCA stents, patent LAD stents with a 50% ostial LAD and subtotally occluded mid AV groove circumflex stent.  He is on dual antiplatelet therapy as well as high-dose statin therapy with LDL 35.  Has been pain-free since in the hospital IV heparin.  Plan for diagnostic cath later today.  November, M.D., FACP, [facility], [facility] [address] [place], [place]  [place]  [phone] 11/19/2020 10:01 AM

## 2020-11-19 NOTE — Progress Notes (Signed)
TCTS consulted for CABG evaluation. °

## 2020-11-19 NOTE — Progress Notes (Signed)
Family Medicine Teaching Service Daily Progress Note Intern Pager: [phone]  Patient name: Ryan Clarke Medical record number: [id number] Date of birth: 10-07-1956 Age: 65 y.o. Gender: male  Primary Care Provider: Littie Deeds, MD Consultants: Cardiology Code Status: Full  Pt Overview and Major Events to Date:  11/17/2020- admitted for unstable angina 11/19/2020- Left Heart Cath  Assessment and Plan: Ryan Clarke a 65 y.o.maleadmitted for unstable angina and awaiting left heart cath.PMH is significant for CAD s/p 29-30 stents, PAF,and HTN.   Unstable Angina  H/o extensive CAD and multiple stents Patient reports no current Chest Pain.  Last Troponin yesterday 123.  He has previously had 29 stents, most recently in 10/21, and his symptoms are usually GI in nature.  EKG yesterday unchanged from previous. Cardiology following and planning on Left Heart Cath today.   - Cardiology following, appreciate Recs - Continue DAPT and lipitor 80 mg - Protonix 40mg   - Cardiac monitoring - VS per protocol - Heparin for DVT PPX as well as ACS tx - NPO for procedure   HTN Chronic and stable. Patient has been well controlled overall.  BP slightly elevated at 142/87.  Home medication is metoprolol 25 mg.  - Continue Toprol XL 25mg  daily  PAF Currently normal rate and rhthym.  Normal Sinus seen on EKG yesterday.  Patient has been sinus brady during this admission and no evidence of recurrent atrial fibrillation. Recommend patient follow up with cardiology as outpatient. - Continue Toprol XL 25mg  daily   FEN/GI: NPO for Cath PPx: Heparin   Status is: Inpatient  Remains inpatient appropriate because:Inpatient level of care appropriate due to severity of illness   Dispo:  Patient From: Home  Planned Disposition: Home  Expected discharge date: 11/20/2020  Medically stable for discharge: No     Subjective:  Patient indicates feels well this morning.  Denies any chest pain or pressure.   No indigestion or other issues.  Objective: Temp:  [97.6 F (36.4 C)-98 F (36.7 C)] 97.6 F (36.4 C) (01/03 0508) Pulse Rate:  [57-67] 67 (01/03 0816) Resp:  [16-18] 16 (01/03 0508) BP: (136-142)/(85-87) 142/87 (01/03 0508) SpO2:  [91 %-98 %] 98 % (01/03 0508) Weight:  [129.3 kg] 129.3 kg (01/03 0508) Physical Exam:  Physical Exam Constitutional:      General: He is not in acute distress.    Appearance: He is not ill-appearing.  HENT:     Head: Normocephalic and atraumatic.  Cardiovascular:     Rate and Rhythm: Normal rate and regular rhythm.     Heart sounds: Normal heart sounds.  Pulmonary:     Breath sounds: Normal breath sounds.  Abdominal:     Palpations: Abdomen is soft.     Tenderness: There is no abdominal tenderness.  Skin:    General: Skin is warm.  Neurological:     Mental Status: He is alert.     Laboratory: Recent Labs  Lab 11/17/20 0345 11/18/20 0143 11/19/20 0225  WBC 6.8 6.0 6.5  HGB 16.0 15.9 15.8  HCT 49.6 46.4 47.7  PLT 187 190 169   Recent Labs  Lab 11/16/20 2039 11/17/20 0345 11/19/20 0225  NA 139 140 137  K 3.4* 3.8 3.6  CL 103 104 106  CO2 24 25 23   BUN 11 12 11   CREATININE 0.88 0.78 0.78  CALCIUM 9.4 8.9 8.9  GLUCOSE 168* 91 100*   Heparin level- 0.62   Imaging/Diagnostic Tests:  No new imaging  2040, MD 11/19/2020, 12:21 PM PGY-1,  [place] Intern pager: [phone], text pages welcome

## 2020-11-19 NOTE — Progress Notes (Signed)
ANTICOAGULATION CONSULT NOTE - Follow Up Consult  Pharmacy Consult for heparin Indication: chest pain/ACS  No Known Allergies  Patient Measurements: Height: 6\' 1"  (185.4 cm) Weight: 129.3 kg (285 lb 0.9 oz) IBW/kg (Calculated) : 79.9 Heparin Dosing Weight: 108.5 kg  Vital Signs: Temp: 97.6 F (36.4 C) (01/03 0508) Temp Source: Oral (01/03 0508) BP: 142/87 (01/03 0508) Pulse Rate: 57 (01/03 0508)  Labs: Recent Labs    11/16/20 2039 11/16/20 2240 11/17/20 0345 11/17/20 0345 11/17/20 0618 11/17/20 1052 11/17/20 1445 11/17/20 1717 11/18/20 0143 11/18/20 1016 11/18/20 2106 11/19/20 0225  HGB 17.2*  --  16.0  --   --   --   --   --  15.9  --   --  15.8  HCT 53.3*  --  49.6  --   --   --   --   --  46.4  --   --  47.7  PLT 204  --  187  --   --   --   --   --  190  --   --  169  HEPARINUNFRC  --   --   --    < >  --   --   --   --  0.74* 1.10* 0.52 0.62  CREATININE 0.88  --  0.78  --   --   --   --   --   --   --   --  0.78  TROPONINIHS 8   < >  --   --    < > 93* 106* 123*  --   --   --   --    < > = values in this interval not displayed.    Estimated Creatinine Clearance: 131.5 mL/min (by C-G formula based on SCr of 0.78 mg/dL).   Assessment: 65 yo male with a history of CAD s/p 30 stents, paroxysmal atrial fibrillation, and HTN presents with unstable angina. PTA the patient is not on anticoagulation. The pt is planned for a left heart cath with possible PCI on 1/3. Pharmacy is consulted to dose heparin.  HL 0.62 at goal, H/H, plt stable. Pending heart cath. Will decrease slightly to keep in goal.   Goal of Therapy:  Heparin level 0.3-0.7 units/ml Monitor platelets by anticoagulation protocol: Yes   Plan:  Decrease heparin to 1100 units/hr Monitor daily heparin level and CBC   77, PharmD, BCPS, [facility] Clinical Pharmacist  Please check AMION for all [facility] Pharmacy phone numbers After 10:00 PM, call Main Pharmacy [phone]

## 2020-11-19 NOTE — Progress Notes (Signed)
ANTICOAGULATION CONSULT NOTE - Follow Up Consult  Pharmacy Consult for heparin Indication: chest pain/ACS  No Known Allergies  Patient Measurements: Height: 6\' 1"  (185.4 cm) Weight: 129.3 kg (285 lb 0.9 oz) IBW/kg (Calculated) : 79.9 Heparin Dosing Weight: 108.5 kg  Vital Signs: Temp: 98 F (36.7 C) (01/03 1518) Temp Source: Oral (01/03 1518) BP: 132/78 (01/03 1518) Pulse Rate: 60 (01/03 1518)  Labs: Recent Labs    11/16/20 2039 11/16/20 2240 11/17/20 0345 11/17/20 0345 11/17/20 0618 11/17/20 1052 11/17/20 1445 11/17/20 1717 11/18/20 0143 11/18/20 1016 11/18/20 2106 11/19/20 0225  HGB 17.2*  --  16.0  --   --   --   --   --  15.9  --   --  15.8  HCT 53.3*  --  49.6  --   --   --   --   --  46.4  --   --  47.7  PLT 204  --  187  --   --   --   --   --  190  --   --  169  HEPARINUNFRC  --   --   --    < >  --   --   --   --  0.74* 1.10* 0.52 0.62  CREATININE 0.88  --  0.78  --   --   --   --   --   --   --   --  0.78  TROPONINIHS 8   < >  --   --    < > 93* 106* 123*  --   --   --   --    < > = values in this interval not displayed.    Estimated Creatinine Clearance: 131.5 mL/min (by C-G formula based on SCr of 0.78 mg/dL).   Assessment: 65 yo male with a history of CAD s/p 30 stents, paroxysmal atrial fibrillation, and HTN presents with unstable angina. PTA the patient is not on anticoagulation. The pt is s/p LHC with plans for surgical consultation, pharmacy to resume heparin 10h after sheath pull.   Goal of Therapy:  Heparin level 0.3-0.7 units/ml Monitor platelets by anticoagulation protocol: Yes   Plan:  Restart heparin no bolus at 1100 units/h at 0300 1/4 Check 6h heparin level after restarting   77, PharmD, BCPS, [facility] Clinical Pharmacist [phone] Please check AMION for all [facility] Pharmacy numbers 11/19/2020

## 2020-11-19 NOTE — Interval H&P Note (Signed)
Cath Lab Visit (complete for each Cath Lab visit)  Clinical Evaluation Leading to the Procedure:   ACS: No.  Non-ACS:    Anginal Classification: CCS III  Anti-ischemic medical therapy: Maximal Therapy (2 or more classes of medications)  Non-Invasive Test Results: No non-invasive testing performed  Prior CABG: No previous CABG      History and Physical Interval Note:  11/19/2020 1:44 PM  Ryan Clarke  has presented today for surgery, with the diagnosis of unstable angina.  The various methods of treatment have been discussed with the patient and family. After consideration of risks, benefits and other options for treatment, the patient has consented to  Procedure(s): LEFT HEART CATH AND CORONARY ANGIOGRAPHY (N/A) as a surgical intervention.  The patient's history has been reviewed, patient examined, no change in status, stable for surgery.  I have reviewed the patient's chart and labs.  Questions were answered to the patient's satisfaction.     Nicki Guadalajara

## 2020-11-20 ENCOUNTER — Inpatient Hospital Stay (HOSPITAL_COMMUNITY): Payer: Medicare HMO

## 2020-11-20 ENCOUNTER — Other Ambulatory Visit (HOSPITAL_COMMUNITY): Payer: Self-pay | Admitting: Respiratory Therapy

## 2020-11-20 DIAGNOSIS — I25110 Atherosclerotic heart disease of native coronary artery with unstable angina pectoris: Secondary | ICD-10-CM

## 2020-11-20 DIAGNOSIS — Z01810 Encounter for preprocedural cardiovascular examination: Secondary | ICD-10-CM

## 2020-11-20 LAB — CBC
HCT: 42.9 % (ref 39.0–52.0)
Hemoglobin: 14.6 g/dL (ref 13.0–17.0)
MCH: 30.5 pg (ref 26.0–34.0)
MCHC: 34 g/dL (ref 30.0–36.0)
MCV: 89.7 fL (ref 80.0–100.0)
Platelets: 175 10*3/uL (ref 150–400)
RBC: 4.78 MIL/uL (ref 4.22–5.81)
RDW: 12.1 % (ref 11.5–15.5)
WBC: 5.6 10*3/uL (ref 4.0–10.5)
nRBC: 0 % (ref 0.0–0.2)

## 2020-11-20 LAB — BASIC METABOLIC PANEL
Anion gap: 10 (ref 5–15)
BUN: 9 mg/dL (ref 8–23)
CO2: 28 mmol/L (ref 22–32)
Calcium: 8.9 mg/dL (ref 8.9–10.3)
Chloride: 102 mmol/L (ref 98–111)
Creatinine, Ser: 0.8 mg/dL (ref 0.61–1.24)
GFR, Estimated: >60 mL/min (ref >60)
Glucose, Bld: 94 mg/dL (ref 70–99)
Potassium: 3.7 mmol/L (ref 3.5–5.1)
Sodium: 140 mmol/L (ref 135–145)

## 2020-11-20 LAB — HEPARIN LEVEL (UNFRACTIONATED): Heparin Unfractionated: 0.47 IU/mL (ref 0.30–0.70)

## 2020-11-20 NOTE — Consult Note (Signed)
[address]       [place] [address]             [phone]      Cardiothoracic Surgery Consultation  Reason for Consult: Severe multivessel coronary artery disease Referring Physician: Dr. Nicki Guadalajara Primary cardiologist: Jodelle Red, MD.  Deran Barro is an 65 y.o. male.  HPI:   The patient is a 65 year old gentleman with a history of hypertension, strong family history of premature coronary artery disease, and coronary artery disease himself having had multiple previous PCI's with history of 29 stents.  His most recent PCI was on 09/14/2020 when he presented with thrombus in the left circumflex stent immediately after the takeoff of the stented obtuse marginal branch with TIMI 0-1 flow down the distal left circumflex.  This was reportedly a difficult intervention requiring PTCA of both the OM ostium as well as a left circumflex stent with a cutting balloon intervention the left circumflex stent due to persistent intimal hyperplasia not resolved with noncompliant balloon dilatation.  He was continued on DAPT using aspirin and Effient.  He said that he did well until New Year's eve.  He was walking around a lot that day and developed some epigastric and lower chest discomfort that he thought was indigestion from eating too much.  This progressed into substernal chest discomfort radiating into the right upper chest and shoulder which she had never had before and was fairly severe.  It did not resolve and prompted evaluation in the emergency room.  His troponin was 93 followed by 106 and an 122.  He had no symptoms after admission.  Cardiac catheterization yesterday showed severe multivessel coronary disease with progressive 80% ostial LAD stenosis immediately proximal to the proximal stent.  There was diffuse 60% narrowing in the proximal and mid LAD stented segment.  There is focal eccentric 80% restenosis within the left circumflex stent immediately after the  takeoff of the marginal vessel.  There is 30% ostial narrowing in the marginal vessel which is stented ostially to proximally.  The RCA is diffusely centered from the ostium to the takeoff of the posterior lateral branch and has patent stents with mild luminal irregularity.  Left ventricular contractility is preserved with a LV EDP of 18 mmHg.  2D echocardiogram showed an ejection fraction of 55 to 60% with no significant valvular abnormality.    Past Medical History:  Diagnosis Date  . Carpal tunnel syndrome 09/10/2020  . Coronary artery disease   . Hypertension     Past Surgical History:  Procedure Laterality Date  . CORONARY BALLOON ANGIOPLASTY N/A 09/14/2020   Procedure: CORONARY BALLOON ANGIOPLASTY;  Surgeon: Lennette Bihari, MD;  Location: [facility];  Service: Cardiovascular;  Laterality: N/A;  . CORONARY STENT PLACEMENT    . LEFT HEART CATH AND CORONARY ANGIOGRAPHY N/A 09/14/2020   Procedure: LEFT HEART CATH AND CORONARY ANGIOGRAPHY;  Surgeon: Lennette Bihari, MD;  Location: [facility];  Service: Cardiovascular;  Laterality: N/A;  . LEFT HEART CATH AND CORONARY ANGIOGRAPHY N/A 11/19/2020   Procedure: LEFT HEART CATH AND CORONARY ANGIOGRAPHY;  Surgeon: Lennette Bihari, MD;  Location: [facility];  Service: Cardiovascular;  Laterality: N/A;  . PACEMAKER INSERTION      Family history: There is a strong family history of premature coronary disease with both of his parents and a brother and sister having coronary bypass surgery.   Social History:  reports that he has  never smoked. He has never used smokeless tobacco. He reports that he does not drink alcohol and does not use drugs. The patient is retired and lives with his wife.  Allergies: No Known Allergies  Medications:  I have reviewed the patient's current medications. Prior to Admission:  Medications Prior to Admission  Medication Sig Dispense Refill Last Dose  . aspirin EC 81 MG tablet Take 81 mg by  mouth daily. Swallow whole.   11/16/2020 at Unknown time  . atorvastatin (LIPITOR) 80 MG tablet Take 80 mg by mouth at bedtime.    11/15/2020  . famotidine (PEPCID) 20 MG tablet TAKE ONE-HALF TABLET BY MOUTH 2 TIMES DAILY AS NEEDED FOR HEARTBURN OR INDIGESTION. (Patient taking differently: Take 10 mg by mouth 2 (two) times daily as needed for heartburn or indigestion.) 30 tablet 1 11/16/2020 at Unknown time  . metoprolol succinate (TOPROL-XL) 25 MG 24 hr tablet Take 25 mg by mouth daily.   11/16/2020 at 0800  . nitroGLYCERIN (NITROSTAT) 0.4 MG SL tablet Place 1 tablet (0.4 mg total) under the tongue every 5 (five) minutes as needed for chest pain. 25 tablet 3 11/16/2020 at Unknown time  . prasugrel (EFFIENT) 10 MG TABS tablet Take 10 mg by mouth daily.   11/16/2020 at Unknown time   Scheduled: . aspirin EC  81 mg Oral Daily  . atorvastatin  80 mg Oral QHS  . isosorbide mononitrate  30 mg Oral Daily  . metoprolol succinate  50 mg Oral Daily  . pantoprazole  40 mg Oral Daily  . sodium chloride flush  3 mL Intravenous Q12H  . sodium chloride flush  3 mL Intravenous Q12H   Continuous: . sodium chloride Stopped (11/20/20 0230)  . sodium chloride    . heparin 1,100 Units/hr (11/20/20 1303)   [id number] chloride, acetaminophen, diazepam, ondansetron (ZOFRAN) IV, sodium chloride flush Anti-infectives (From admission, onward)   None      Results for orders placed or performed during the hospital encounter of 11/16/20 (from the past 48 hour(s))  Heparin level (unfractionated)     Status: None   Collection Time: 11/18/20  9:06 PM  Result Value Ref Range   Heparin Unfractionated 0.52 0.30 - 0.70 IU/mL    Comment: (NOTE) If heparin results are below expected values, and patient dosage has  been confirmed, suggest follow up testing of antithrombin III levels. Performed at [facility], [place] [address], [place], [place] [address]   Heparin level (unfractionated)     Status: None    Collection Time: 11/19/20  2:25 AM  Result Value Ref Range   Heparin Unfractionated 0.62 0.30 - 0.70 IU/mL    Comment: (NOTE) If heparin results are below expected values, and patient dosage has  been confirmed, suggest follow up testing of antithrombin III levels. Performed at [facility], [place] [address] [place], [place] [address]   CBC     Status: None   Collection Time: 11/19/20  2:25 AM  Result Value Ref Range   WBC 6.5 4.0 - 10.5 K/uL   RBC 5.30 4.22 - 5.81 MIL/uL   Hemoglobin 15.8 13.0 - 17.0 g/dL   HCT 47.7 39.0 - 52.0 %   MCV 90.0 80.0 - 100.0 fL   MCH 29.8 26.0 - 34.0 pg   MCHC 33.1 30.0 - 36.0 g/dL   RDW 11.9 11.5 - 15.5 %   Platelets 169 150 - 400 K/uL   nRBC 0.0 0.0 - 0.2 %    Comment:  Performed at [facility], [place] [address] [place], [place] [id number]  Basic metabolic panel     Status: Abnormal   Collection Time: 11/19/20  2:25 AM  Result Value Ref Range   Sodium 137 135 - 145 mmol/L   Potassium 3.6 3.5 - 5.1 mmol/L   Chloride 106 98 - 111 mmol/L   CO2 23 22 - 32 mmol/L   Glucose, Bld 100 (H) 70 - 99 mg/dL    Comment: Glucose reference range applies only to samples taken after fasting for at least 8 hours.   BUN 11 8 - 23 mg/dL   Creatinine, Ser 0.78 0.61 - 1.24 mg/dL   Calcium 8.9 8.9 - 10.3 mg/dL   GFR, Estimated >60 >60 mL/min    Comment: (NOTE) Calculated using the CKD-EPI Creatinine Equation (2021)    Anion gap 8 5 - 15    Comment: Performed at [facility] [address] [place] [address]  CBC     Status: None   Collection Time: 11/20/20  6:02 AM  Result Value Ref Range   WBC 5.6 4.0 - 10.5 K/uL   RBC 4.78 4.22 - 5.81 MIL/uL   Hemoglobin 14.6 13.0 - 17.0 g/dL   HCT 42.9 39.0 - 52.0 %   MCV 89.7 80.0 - 100.0 fL   MCH 30.5 26.0 - 34.0 pg   MCHC 34.0 30.0 - 36.0 g/dL   RDW 12.1 11.5 - 15.5 %   Platelets 175 150 - 400 K/uL   nRBC 0.0 0.0 - 0.2 %    Comment: Performed at [facility], [address]  [place], [place] [id number]  Basic metabolic panel     Status: None   Collection Time: 11/20/20  6:02 AM  Result Value Ref Range   Sodium 140 135 - 145 mmol/L   Potassium 3.7 3.5 - 5.1 mmol/L   Chloride 102 98 - 111 mmol/L   CO2 28 22 - 32 mmol/L   Glucose, Bld 94 70 - 99 mg/dL    Comment: Glucose reference range applies only to samples taken after fasting for at least 8 hours.   BUN 9 8 - 23 mg/dL   Creatinine, Ser 0.80 0.61 - 1.24 mg/dL   Calcium 8.9 8.9 - 10.3 mg/dL   GFR, Estimated >60 >60 mL/min    Comment: (NOTE) Calculated using the CKD-EPI Creatinine Equation (2021)    Anion gap 10 5 - 15    Comment: Performed at [facility] [address] [place], [place] [address]  Heparin level (unfractionated)     Status: None   Collection Time: 11/20/20  9:57 AM  Result Value Ref Range   Heparin Unfractionated 0.47 0.30 - 0.70 IU/mL    Comment: (NOTE) If heparin results are below expected values, and patient dosage has  been confirmed, suggest follow up testing of antithrombin III levels. Performed at [facility], [place] [address], [place], [place] [address]     CARDIAC CATHETERIZATION  Result Date: 11/19/2020  1st Mrg lesion is 40% stenosed.  Prox Cx to Mid Cx lesion is 80% stenosed.  Non-stenotic RPAV lesion was previously treated.  Ost RCA to Dist RCA lesion is 5% stenosed.  Dist LM to Ost LAD lesion is 80% stenosed.  Mid LAD lesion is 60% stenosed with 60% stenosed side branch in 1st Sept.  Previously placed 2nd Diag stent (unknown type) is widely patent.  Multivessel CAD with stents in the LAD, diagonal,  circumflex, circumflex marginal, and RCA extending to the PLA takeoff. The LAD has progressive 80% ostial stenosis which seems to occur immediately proximal to the proximal stent.  There is diffuse 60% narrowing in the proximal and mid stented segment.  The diagonal stent is patent.  The distal portion of the mid LAD stent is patent. Left circumflex stent extending from  the ostium to the mid AV groove circumflex with focal eccentric 80% restenosis in the mid circumflex immediately after the takeoff of the marginal vessel.  There is 30 to 40% ostial narrowing in th marginal vessel which is stented ostially to proximally. The RCA is diffusely stented from the ostium to the takeoff of the PLA vessel.  There is mild luminal irregularity. Preserved global LV contractility with EF estimate at 18 mmHg. RECOMMENDATION: The angiographic findings were reviewed with Dr. Gwenlyn Found in the catheterization laboratory.  With the ostial progression of 80% in the LAD as well as again restenosis in the circumflex vessel recommend surgical consultation for optimal long-term benefit.  We will hold prasugrel.  Increase anti-ischemic medications, optimal lipid management and BP control.   ECHOCARDIOGRAM COMPLETE  Result Date: 11/19/2020    ECHOCARDIOGRAM REPORT   Patient Name:   ABDULLAHI CARTEN Date of Exam: 11/19/2020 Medical Rec #:  [id number]    Height:       73.0 in Accession #:    [id number]   Weight:       285.1 lb Date of Birth:  04-Mar-1956    BSA:          2.501 m Patient Age:    34 years     BP:           132/78 mmHg Patient Gender: M            HR:           62 bpm. Exam Location:  Inpatient Procedure: 2D Echo, Color Doppler and Cardiac Doppler Indications:    CAD Native Vessel i25.0  History:        Patient has no prior history of Echocardiogram examinations.                 CAD, Pacemaker, Arrythmias:Atrial Fibrillation; Risk                 Factors:Hypertension.  Sonographer:    Raquel Sarna Senior RDCS Referring Phys: [place]  1. Left ventricular ejection fraction, by estimation, is 55 to 60%. The left ventricle has normal function. The left ventricle has no regional wall motion abnormalities. There is mild concentric left ventricular hypertrophy. Left ventricular diastolic parameters are consistent with Grade I diastolic dysfunction (impaired relaxation).  2. Right ventricular  systolic function is normal. The right ventricular size is normal.  3. The mitral valve is normal in structure. No evidence of mitral valve regurgitation. No evidence of mitral stenosis.  4. The aortic valve is tricuspid. Aortic valve regurgitation is not visualized. No aortic stenosis is present.  5. Aortic dilatation noted. There is mild dilatation at the level of the sinuses of Valsalva, measuring 39 mm.  6. The inferior vena cava is normal in size with greater than 50% respiratory variability, suggesting right atrial pressure of 3 mmHg. FINDINGS  Left Ventricle: Left ventricular ejection fraction, by estimation, is 55 to 60%. The left ventricle has normal function. The left ventricle has no regional wall motion abnormalities. The left ventricular internal cavity size was normal in size. There is  mild concentric left ventricular  hypertrophy. Left ventricular diastolic parameters are consistent with Grade I diastolic dysfunction (impaired relaxation). Indeterminate filling pressures. Right Ventricle: The right ventricular size is normal. No increase in right ventricular wall thickness. Right ventricular systolic function is normal. Left Atrium: Left atrial size was normal in size. Right Atrium: Right atrial size was normal in size. Pericardium: There is no evidence of pericardial effusion. Mitral Valve: The mitral valve is normal in structure. No evidence of mitral valve regurgitation. No evidence of mitral valve stenosis. Tricuspid Valve: The tricuspid valve is normal in structure. Tricuspid valve regurgitation is trivial. No evidence of tricuspid stenosis. Aortic Valve: The aortic valve is tricuspid. Aortic valve regurgitation is not visualized. No aortic stenosis is present. Pulmonic Valve: The pulmonic valve was normal in structure. Pulmonic valve regurgitation is not visualized. No evidence of pulmonic stenosis. Aorta: Aortic dilatation noted. There is mild dilatation at the level of the sinuses of Valsalva,  measuring 39 mm. Venous: The inferior vena cava was not well visualized. The inferior vena cava is normal in size with greater than 50% respiratory variability, suggesting right atrial pressure of 3 mmHg. IAS/Shunts: No atrial level shunt detected by color flow Doppler. Additional Comments: A pacer wire is visualized.  LEFT VENTRICLE PLAX 2D LVIDd:         5.10 cm  Diastology LVIDs:         3.60 cm  LV e' medial:    5.22 cm/s LV PW:         1.30 cm  LV E/e' medial:  9.8 LV IVS:        1.27 cm  LV e' lateral:   5.44 cm/s LVOT diam:     2.50 cm  LV E/e' lateral: 9.4 LV SV:         94 LV SV Index:   37 LVOT Area:     4.91 cm  RIGHT VENTRICLE RV S prime:     12.10 cm/s TAPSE (M-mode): 2.5 cm LEFT ATRIUM             Index       RIGHT ATRIUM           Index LA diam:        4.00 cm 1.60 cm/m  RA Area:     15.60 cm LA Vol (A2C):   62.8 ml 25.11 ml/m RA Volume:   36.60 ml  14.63 ml/m LA Vol (A4C):   62.8 ml 25.11 ml/m LA Biplane Vol: 65.6 ml 26.23 ml/m  AORTIC VALVE LVOT Vmax:   81.10 cm/s LVOT Vmean:  56.600 cm/s LVOT VTI:    0.191 m  AORTA Ao Root diam: 3.90 cm Ao Asc diam:  3.60 cm MITRAL VALVE               TRICUSPID VALVE MV Area (PHT): 1.76 cm    TR Peak grad:   8.2 mmHg MV Decel Time: 430 msec    TR Vmax:        143.00 cm/s MV E velocity: 51.40 cm/s MV A velocity: 57.80 cm/s  SHUNTS MV E/A ratio:  0.89        Systemic VTI:  0.19 m                            Systemic Diam: 2.50 cm Skeet Latch MD Electronically signed by Skeet Latch MD Signature Date/Time: 11/19/2020/4:44:06 PM    Final    VAS US DOPPLER PRE CABG  Result Date: 11/20/2020 PREOPERATIVE VASCULAR EVALUATION  Indications:      Pre-CABG. Risk Factors:     Hypertension, coronary artery disease. Comparison Study: No prior studies. Performing Technologist: Rogelia Rohrer RVT, RDMS Supporting Technologist: Darlin Coco RDMS  Examination Guidelines: A complete evaluation includes B-mode imaging, spectral Doppler, color Doppler, and power Doppler as  needed of all accessible portions of each vessel. Bilateral testing is considered an integral part of a complete examination. Limited examinations for reoccurring indications may be performed as noted.  Right Carotid Findings: +----------+--------+--------+--------+-----------------------+--------+           PSV cm/sEDV cm/sStenosisDescribe               Comments +----------+--------+--------+--------+-----------------------+--------+ CCA Prox  54      11                                              +----------+--------+--------+--------+-----------------------+--------+ CCA Distal56      14                                              +----------+--------+--------+--------+-----------------------+--------+ ICA Prox  43      17      1-39%   heterogenous and smooth         +----------+--------+--------+--------+-----------------------+--------+ ICA Distal64      25                                              +----------+--------+--------+--------+-----------------------+--------+ ECA       86      12                                              +----------+--------+--------+--------+-----------------------+--------+ Portions of this table do not appear on this page. +----------+--------+-------+----------------+------------+           PSV cm/sEDV cmsDescribe        Arm Pressure +----------+--------+-------+----------------+------------+ Subclavian102            Multiphasic, WNL             +----------+--------+-------+----------------+------------+ +---------+--------+--+--------+--+---------+ VertebralPSV cm/s33EDV cm/s11Antegrade +---------+--------+--+--------+--+---------+ Left Carotid Findings: +----------+--------+--------+--------+--------+--------+           PSV cm/sEDV cm/sStenosisDescribeComments +----------+--------+--------+--------+--------+--------+ CCA Prox  73      11                                +----------+--------+--------+--------+--------+--------+ CCA Distal57      9                                +----------+--------+--------+--------+--------+--------+ ICA Prox  49      19      1-39%   smooth           +----------+--------+--------+--------+--------+--------+ ICA Distal71      31                               +----------+--------+--------+--------+--------+--------+  ECA       75      10                               +----------+--------+--------+--------+--------+--------+ +----------+--------+--------+----------------+------------+ SubclavianPSV cm/sEDV cm/sDescribe        Arm Pressure +----------+--------+--------+----------------+------------+           119             Multiphasic, WNL             +----------+--------+--------+----------------+------------+ +---------+--------+--+--------+--+---------+ VertebralPSV cm/s31EDV cm/s10Antegrade +---------+--------+--+--------+--+---------+  ABI Findings: +--------+------------------+-----+---------+--------+ Right   Rt Pressure (mmHg)IndexWaveform Comment  +--------+------------------+-----+---------+--------+ [id number]                    triphasic         +--------+------------------+-----+---------+--------+ PTA     154               1.17 triphasic         +--------+------------------+-----+---------+--------+ DP      143               1.08 triphasic         +--------+------------------+-----+---------+--------+ +--------+------------------+-----+---------+-------+ Left    Lt Pressure (mmHg)IndexWaveform Comment +--------+------------------+-----+---------+-------+ [id number]                    triphasic        +--------+------------------+-----+---------+-------+ PTA     162               1.23 triphasic        +--------+------------------+-----+---------+-------+ DP      132               1.00 triphasic         +--------+------------------+-----+---------+-------+ +-------+---------------+----------------+ ABI/TBIToday's ABI/TBIPrevious ABI/TBI +-------+---------------+----------------+ Right  1.17                            +-------+---------------+----------------+ Left   1.23                            +-------+---------------+----------------+  Right Doppler Findings: +--------+--------+-----+---------+--------+ Site    PressureIndexDoppler  Comments +--------+--------+-----+---------+--------+ [id number]          triphasic         +--------+--------+-----+---------+--------+ Radial               triphasic         +--------+--------+-----+---------+--------+ Ulnar                triphasic         +--------+--------+-----+---------+--------+  Left Doppler Findings: +--------+--------+-----+---------+--------+ Site    PressureIndexDoppler  Comments +--------+--------+-----+---------+--------+ [id number]          triphasic         +--------+--------+-----+---------+--------+ Radial               triphasic         +--------+--------+-----+---------+--------+ Ulnar                triphasic         +--------+--------+-----+---------+--------+  Summary: Right Carotid: Velocities in the right ICA are consistent with a 1-39% stenosis. Left Carotid: Velocities in the left ICA are consistent with a 1-39% stenosis. Right ABI: Resting right ankle-brachial index is within normal range. No evidence of significant right lower extremity arterial disease. Left ABI: Resting left ankle-brachial index is within  normal range. No evidence of significant left lower extremity arterial disease. Right Upper Extremity: Doppler waveforms remain within normal limits with right radial compression. Doppler waveforms remain within normal limits with right ulnar compression. Left Upper Extremity: Doppler waveforms remain within normal limits with left radial compression. Doppler waveforms  remain within normal limits with left ulnar compression.  Electronically signed by Curt Jews MD on 11/20/2020 at 2:00:12 PM.    Final     Review of Systems  Constitutional: Negative for fatigue.  HENT: Negative.   Eyes: Negative.   Respiratory: Positive for chest tightness. Negative for shortness of breath.   Cardiovascular: Positive for chest pain. Negative for leg swelling.  Gastrointestinal: Negative.   Endocrine: Negative.   Genitourinary: Negative.   Musculoskeletal: Positive for back pain.  Allergic/Immunologic: Negative.   Neurological: Negative for dizziness and syncope.  Hematological: Negative.   Psychiatric/Behavioral: Negative.    Blood pressure 103/74, pulse 68, temperature 98.3 F (36.8 C), temperature source Oral, resp. rate 20, height 6\' 1"  (1.854 m), weight 129.3 kg, SpO2 94 %. Physical Exam Constitutional:      Appearance: He is well-developed. He is obese.  HENT:     Head: Normocephalic and atraumatic.  Eyes:     Extraocular Movements: Extraocular movements intact.     Pupils: Pupils are equal, round, and reactive to light.  Cardiovascular:     Rate and Rhythm: Normal rate and regular rhythm.     Pulses: Normal pulses.     Heart sounds: Normal heart sounds. No murmur heard.   Pulmonary:     Effort: Pulmonary effort is normal.     Breath sounds: Normal breath sounds.  Abdominal:     General: There is no distension.     Tenderness: There is no abdominal tenderness.  Musculoskeletal:        General: No swelling. Normal range of motion.     Cervical back: Normal range of motion and neck supple.  Skin:    General: Skin is warm and dry.  Neurological:     General: No focal deficit present.     Mental Status: He is alert and oriented to person, place, and time.  Psychiatric:        Mood and Affect: Mood normal.        Behavior: Behavior normal.    Physicians  Panel Physicians Referring Physician Case Authorizing Physician  Troy Sine, MD  (Primary)      Procedures  LEFT HEART CATH AND CORONARY ANGIOGRAPHY   Conclusion    1st Mrg lesion is 40% stenosed.  Prox Cx to Mid Cx lesion is 80% stenosed.  Non-stenotic RPAV lesion was previously treated.  Ost RCA to Dist RCA lesion is 5% stenosed.  Dist LM to Ost LAD lesion is 80% stenosed.  Mid LAD lesion is 60% stenosed with 60% stenosed side branch in 1st Sept.  Previously placed 2nd Diag stent (unknown type) is widely patent.   Multivessel CAD with stents in the LAD, diagonal, circumflex, circumflex marginal, and RCA extending to the PLA takeoff.  The LAD has progressive 80% ostial stenosis which seems to occur immediately proximal to the proximal stent.  There is diffuse 60% narrowing in the proximal and mid stented segment.  The diagonal stent is patent.  The distal portion of the mid LAD stent is patent.  Left circumflex stent extending from the ostium to the mid AV groove circumflex with focal eccentric 80% restenosis in the mid circumflex immediately after the takeoff of  the marginal vessel.  There is 30 to 40% ostial narrowing in th marginal vessel which is stented ostially to proximally.  The RCA is diffusely stented from the ostium to the takeoff of the PLA vessel.  There is mild luminal irregularity.  Preserved global LV contractility with EF estimate at 18 mmHg.  RECOMMENDATION: The angiographic findings were reviewed with Dr. Gwenlyn Found in the catheterization laboratory.  With the ostial progression of 80% in the LAD as well as again restenosis in the circumflex vessel recommend surgical consultation for optimal long-term benefit.  We will hold prasugrel.  Increase anti-ischemic medications, optimal lipid management and BP control.    Recommendations  Antiplatelet/Anticoag Will hold prasugrel and obtain surgical consultation for CABG revascularization.   Indications  Unstable angina (HCC) [I20.0 (ICD-10-CM)]   Procedural Details  Technical  Details Mr. Tarrant Nygaard is a 65 year old gentleman who is originally from [place].  Over the last 26 years he has undergone multiple coronary interventions and has a total of 29 coronary stents placed on [place], [place] with his initial stent inserted at age 48 he had recently moved to [place].  He has a history of atrial fibrillation.  He is status post permanent pacemaker insertion in 2019.  He was admitted in October 2021 with unstable angina and at that time was found to have bifurcation stenosis of the left circumflex and OM vessel and underwent complex coronary intervention requiring kissing balloon technique initial plaque shifting but ultimately successful.  He also had 80% ostial LAD disease.  Subsequent to his October catheterization, he he felt well.  Over the past several weeks he has noticed some mild recurrent chest pain but on New Year's Eve chest pain symptoms became significantly worse prompting his hospitalization.  Troponins are mildly positive at 123.  He has been on heparin therapy.  He is referred for definitive repeat cardiac catheterization.  The patient was brought to the cardiac catheterization lab in the fasting state. The patient was premedicated with Versed 2 mg and fentanyl 50 mcg.ultrasound guidance was used for right radial access.  The right radial artery was punctured via the Seldinger technique, and a 6 [place] Glidesheath Slender was inserted.  A radial cocktail consisting of Verapamil 3 mg was administered. The patient received 6500 units of weight adjusted heparin. A safety J wire was advanced into the ascending aorta. Diagnostic catheterization was done with a 5 [place] TIG 4.0 catheter.  Hand injection left ventriculography was performed.  Angiograms were reviewed with Dr. Gwenlyn Found in the laboratory.  A TR radial band was applied for hemostasis. The patient left the catheterization laboratory in stable condition.   Estimated blood loss <50 mL.   During this  procedure medications were administered to achieve and maintain moderate conscious sedation while the patient's heart rate, blood pressure, and oxygen saturation were continuously monitored and I was present face-to-face 100% of this time.   Medications (Filter: Administrations occurring from 1334 to 1502 on 11/19/20) (important) Continuous medications are totaled by the amount administered until 11/19/20 1502.    fentaNYL (SUBLIMAZE) injection (mcg) Total dose:  75 mcg  Date/Time Rate/Dose/Volume Action   11/19/20 1344 50 mcg Given   1443 25 mcg Given    midazolam (VERSED) injection (mg) Total dose:  3 mg  Date/Time Rate/Dose/Volume Action   11/19/20 1344 2 mg Given   1443 1 mg Given    lidocaine (PF) (XYLOCAINE) 1 % injection (mL) Total volume:  8 mL  Date/Time Rate/Dose/Volume Action  11/19/20 1401 8 mL Given    Radial Cocktail/Verapamil only (mL) Total volume:  10 mL  Date/Time Rate/Dose/Volume Action   11/19/20 1424 10 mL Given    heparin sodium (porcine) injection (Units) Total dose:  6,500 Units  Date/Time Rate/Dose/Volume Action   11/19/20 1427 6,500 Units Given    iohexol (OMNIPAQUE) 350 MG/ML injection (mL) Total volume:  80 mL  Date/Time Rate/Dose/Volume Action   11/19/20 1453 80 mL Given    Heparin (Porcine) in NaCl 1000-0.9 UT/500ML-% SOLN (mL) Total volume:  500 mL  Date/Time Rate/Dose/Volume Action   11/19/20 1455 500 mL Given    Heparin (Porcine) in NaCl 1000-0.9 UT/500ML-% SOLN (mL) Total volume:  500 mL  Date/Time Rate/Dose/Volume Action   11/19/20 1455 500 mL Given    aspirin EC tablet 81 mg (mg) Total dose:  Cannot be calculated*  *Administration dose not documented Date/Time Rate/Dose/Volume Action   11/19/20 1334 *Not included in total MAR Hold    atorvastatin (LIPITOR) tablet 80 mg (mg) Total dose:  Cannot be calculated*  *Administration dose not documented Date/Time Rate/Dose/Volume Action   11/19/20 1334 *Not included in  total MAR Hold    pantoprazole (PROTONIX) EC tablet 40 mg (mg) Total dose:  Cannot be calculated*  *Administration dose not documented Date/Time Rate/Dose/Volume Action   11/19/20 1334 *Not included in total MAR Hold    sodium chloride flush (NS) 0.9 % injection 3 mL (mL) Total dose:  Cannot be calculated* Dosing weight:  128.6  *Administration dose not documented Date/Time Rate/Dose/Volume Action   11/19/20 1334 *Not included in total MAR Hold    metoprolol succinate (TOPROL-XL) 24 hr tablet 25 mg (mg) Total dose:  Cannot be calculated*  *Administration dose not documented Date/Time Rate/Dose/Volume Action   11/19/20 1334 *Not included in total MAR Hold    prasugrel (EFFIENT) tablet 10 mg (mg) Total dose:  Cannot be calculated*  *Administration dose not documented Date/Time Rate/Dose/Volume Action   11/19/20 1334 *Not included in total MAR Hold    Sedation Time  Sedation Time Physician-1: 1 hour 8 minutes 27 seconds   Contrast  Medication Name Total Dose  iohexol (OMNIPAQUE) 350 MG/ML injection 80 mL    Radiation/Fluoro  Fluoro time: 3.8 (min) DAP: [id number] (mGycm2) Cumulative Air Kerma: [id number] (mGy)   Coronary Findings   Diagnostic Dominance: Right  Left Main  Dist LM to Ost LAD lesion is 80% stenosed. The lesion was previously treated.  Left Anterior Descending  Mid LAD lesion is 60% stenosed with 60% stenosed side branch in 1st Sept. The lesion was previously treated.  First Diagonal Branch  Vessel is small in size.  Second Diagonal Branch  Previously placed 2nd Diag stent (unknown type) is widely patent.  Left Circumflex  Prox Cx to Mid Cx lesion is 80% stenosed. The lesion was previously treated.  First Obtuse Marginal Branch  1st Mrg lesion is 40% stenosed. The lesion was previously treated.  Right Coronary Artery  Ost RCA to Dist RCA lesion is 5% stenosed. The lesion was previously treated.  Right Posterior Atrioventricular Artery  Non-stenotic  RPAV lesion was previously treated.   Intervention   No interventions have been documented.  Left Heart  Left Ventricle There is preserved global LV contractility with EF estimate 55%.  LVEDP was 18 mmHg.   Coronary Diagrams   Diagnostic Dominance: Right    Intervention    Implants    No implant documentation for this case.   Syngo Images  Show images for CARDIAC CATHETERIZATION  Images on Long Term Storage  Show images for Dontrail, Slivinski "Ronalee Belts"  Link to Procedure Log  Procedure Log     Hemo Data  Flowsheet Row Most Recent Value  AO Systolic Pressure XX123456 mmHg  AO Diastolic Pressure 99 mmHg  AO Mean 0000000 mmHg  LV Systolic Pressure 0000000 mmHg  LV Diastolic Pressure 13 mmHg  LV EDP 18 mmHg  AOp Systolic Pressure 123XX123 mmHg  AOp Diastolic Pressure 94 mmHg  AOp Mean Pressure 123456 mmHg  LVp Systolic Pressure XX123456 mmHg  LVp Diastolic Pressure 20 mmHg  LVp EDP Pressure 25 mmHg     ECHOCARDIOGRAM REPORT       Patient Name:  ISAURO MORRILL Date of Exam: 11/19/2020  Medical Rec #: [id number]  Height:    73.0 in  Accession #:  [id number]  Weight:    285.1 lb  Date of Birth: 1956/06/23  BSA:     2.501 m  Patient Age:  75 years   BP:      132/78 mmHg  Patient Gender: M      HR:      62 bpm.  Exam Location: Inpatient   Procedure: 2D Echo, Color Doppler and Cardiac Doppler   Indications:  CAD Native Vessel i25.0    History:    Patient has no prior history of Echocardiogram  examinations.         CAD, Pacemaker, Arrythmias:Atrial Fibrillation; Risk         Factors:Hypertension.    Sonographer:  Raquel Sarna Senior RDCS  Referring Phys: [place]    1. Left ventricular ejection fraction, by estimation, is 55 to 60%. The  left ventricle has normal function. The left ventricle has no regional  wall motion abnormalities. There is mild concentric left ventricular   hypertrophy. Left ventricular diastolic  parameters are consistent with Grade I diastolic dysfunction (impaired  relaxation).  2. Right ventricular systolic function is normal. The right ventricular  size is normal.  3. The mitral valve is normal in structure. No evidence of mitral valve  regurgitation. No evidence of mitral stenosis.  4. The aortic valve is tricuspid. Aortic valve regurgitation is not  visualized. No aortic stenosis is present.  5. Aortic dilatation noted. There is mild dilatation at the level of the  sinuses of Valsalva, measuring 39 mm.  6. The inferior vena cava is normal in size with greater than 50%  respiratory variability, suggesting right atrial pressure of 3 mmHg.   FINDINGS  Left Ventricle: Left ventricular ejection fraction, by estimation, is 55  to 60%. The left ventricle has normal function. The left ventricle has no  regional wall motion abnormalities. The left ventricular internal cavity  size was normal in size. There is  mild concentric left ventricular hypertrophy. Left ventricular diastolic  parameters are consistent with Grade I diastolic dysfunction (impaired  relaxation). Indeterminate filling pressures.   Right Ventricle: The right ventricular size is normal. No increase in  right ventricular wall thickness. Right ventricular systolic function is  normal.   Left Atrium: Left atrial size was normal in size.   Right Atrium: Right atrial size was normal in size.   Pericardium: There is no evidence of pericardial effusion.   Mitral Valve: The mitral valve is normal in structure. No evidence of  mitral valve regurgitation. No evidence of mitral valve stenosis.   Tricuspid Valve: The tricuspid valve is normal in structure. Tricuspid  valve regurgitation is trivial. No evidence of tricuspid stenosis.  Aortic Valve: The aortic valve is tricuspid. Aortic valve regurgitation is  not visualized. No aortic stenosis is present.    Pulmonic Valve: The pulmonic valve was normal in structure. Pulmonic valve  regurgitation is not visualized. No evidence of pulmonic stenosis.   Aorta: Aortic dilatation noted. There is mild dilatation at the level of  the sinuses of Valsalva, measuring 39 mm.   Venous: The inferior vena cava was not well visualized. The inferior vena  cava is normal in size with greater than 50% respiratory variability,  suggesting right atrial pressure of 3 mmHg.   IAS/Shunts: No atrial level shunt detected by color flow Doppler.   Additional Comments: A pacer wire is visualized.     LEFT VENTRICLE  PLAX 2D  LVIDd:     5.10 cm Diastology  LVIDs:     3.60 cm LV e' medial:  5.22 cm/s  LV PW:     1.30 cm LV E/e' medial: 9.8  LV IVS:    1.27 cm LV e' lateral:  5.44 cm/s  LVOT diam:   2.50 cm LV E/e' lateral: 9.4  LV SV:     94  LV SV Index:  37  LVOT Area:   4.91 cm     RIGHT VENTRICLE  RV S prime:   12.10 cm/s  TAPSE (M-mode): 2.5 cm   LEFT ATRIUM       Index    RIGHT ATRIUM      Index  LA diam:    4.00 cm 1.60 cm/m RA Area:   15.60 cm  LA Vol (A2C):  62.8 ml 25.11 ml/m RA Volume:  36.60 ml 14.63 ml/m  LA Vol (A4C):  62.8 ml 25.11 ml/m  LA Biplane Vol: 65.6 ml 26.23 ml/m  AORTIC VALVE  LVOT Vmax:  81.10 cm/s  LVOT Vmean: 56.600 cm/s  LVOT VTI:  0.191 m    AORTA  Ao Root diam: 3.90 cm  Ao Asc diam: 3.60 cm   MITRAL VALVE        TRICUSPID VALVE  MV Area (PHT): 1.76 cm  TR Peak grad:  8.2 mmHg  MV Decel Time: 430 msec  TR Vmax:    143.00 cm/s  MV E velocity: 51.40 cm/s  MV A velocity: 57.80 cm/s SHUNTS  MV E/A ratio: 0.89    Systemic VTI: 0.19 m               Systemic Diam: 2.50 cm   Skeet Latch MD  Electronically signed by Skeet Latch MD  Signature Date/Time: 11/19/2020/4:44:06 PM      Final    Assessment/Plan:  This 65 year old gentleman has a  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 Cath shows 80% ostial LAD stenosis which appears to be immediately proximal to the proximal stent as well as diffuse 60% narrowing in the proximal and mid stented segment. There are two diagonal branchs and the first has a patent stent. The second diagonal is small. The left circumflex has 80% stenosis within the previous stent beyond the marginal branch and the marginal branch is patent with mild ostial narrowing. I don't think there is enough stenosis to warrant grafting of the marginal branch because the graft would likely occlude due to competitive flow. The distal LCX is small and not graftable. The RCA stents are patent and don't require grafting. The main benefit for him would be grafting the distal LAD and probably the first diagonal with  arterial grafts, either bilateral IMA or LIMA and left radial. He has an intricate tatoo on the left forearm so I would like to avoid that area unless needed. His arterial dopplers do show patent palmar arches bilaterally. He said he would agree to using the left radial if I felt it was needed. It is also possible that he could develop stenosis in the future in any of the other currently patent stents.  He has been on Effient and the last dose was yesterday so he will have to wait until next Monday 11/26/20 to do surgery. Cardiology will decide if patient is to stay in the hospital on heparin until then. I discussed the operative procedure with the patient including alternatives, benefits and risks; including but not limited to bleeding, blood transfusion, infection, stroke, myocardial infarction, graft failure, heart block requiring a permanent pacemaker, organ dysfunction, and death.  Carlyon Shadow understands and agrees to proceed.  We will schedule surgery for Monday 11/26/20.  I spent 60 minutes performing this consultation and > 50% of this time was spent face to face  counseling and coordinating the care of this patient's severe multivessel CAD.  Gaye Pollack 11/20/2020, 3:48 PM

## 2020-11-20 NOTE — Progress Notes (Signed)
Pre-CABG ultrasound study completed.   Please see CV Proc for preliminary results.   Rossy Virag, RDMS  

## 2020-11-20 NOTE — Progress Notes (Signed)
Family Medicine Teaching Service Daily Progress Note Intern Pager: [phone]  Patient name: Ryan Clarke Medical record number: [id number] Date of birth: 07/08/1956 Age: 65 y.o. Gender: male  Primary Care Provider: Littie Deeds, MD Consultants: Cardiology, TCTS Code Status: Full  Pt Overview and Major Events to Date:  11/17/2020- admitted for unstable angina 11/19/2020- Left Heart Cath  Assessment and Plan: Ryan Clarke a 65 y.o.maleadmitted for unstable angina and awaiting left heart cath.PMH is significant for CAD s/p 29-30 stents, PAF,andHTN.  Unstable Angina  H/o extensive CAD and multiple stents Patient reports no current symptoms.  Had LVH yesterday that showed 80% ostial LAD stenosis and restenosis of mCX. Echo with preserved LVEF.  Cards fdollowing.  Consulted TCTS for CABG evaluation today.   - Cardiology following, appreciate Recs - Continue to hold Prasugrel - Continue ASA, BB and statin  - Start Daily Imdur - Protonix 40mg   - Cardiac monitoring - VS per protocol - Heparin for DVT PPX as well as ACS tx  HTN Chronic and stable.Patient has been well controlled overall.  BP slightly elevated at 142/87.  Home medication is metoprolol 25 mg.  - Increased Toprol XL 50mg  daily  PAF Currently normal rate and rhthym.  Normal Sinus seen on EKG yesterday.  Patient has been sinus brady during this admissionand no evidence of recurrent atrial fibrillation. Recommend patient follow up with cardiology as outpatient. - Continue Toprol XL 50mg  daily  FEN/GI: Carb Modified Diet PPx: Heparin drip  Status is: Inpatient  Remains inpatient appropriate because:Ongoing diagnostic testing needed not appropriate for outpatient work up   Dispo:  Patient From: Home  Planned Disposition: Home  Expected discharge date: 11/23/2020  Medically stable for discharge: No    Subjective:  Patient indicates is feeling well this morning.  Does not have any chest pain, pressure or  tightness.    Objective: Temp:  [97.3 F (36.3 C)-98.7 F (37.1 C)] 98.7 F (37.1 C) (01/04 0828) Pulse Rate:  [0-104] 68 (01/03 2035) Resp:  [16-90] 18 (01/04 0828) BP: (104-180)/(19-107) 104/71 (01/04 0828) SpO2:  [0 %-96 %] 94 % (01/03 2035) Physical Exam:  Physical Exam Constitutional:      Appearance: He is well-developed.  HENT:     Head: Normocephalic and atraumatic.  Cardiovascular:     Rate and Rhythm: Normal rate and regular rhythm.  Pulmonary:     Effort: Pulmonary effort is normal.     Breath sounds: Normal breath sounds.  Abdominal:     General: There is no abdominal bruit.     Palpations: Abdomen is soft.  Skin:    General: Skin is warm.  Neurological:     General: No focal deficit present.     Mental Status: He is alert.     Laboratory: Recent Labs  Lab 11/18/20 0143 11/19/20 0225 11/20/20 0602  WBC 6.0 6.5 5.6  HGB 15.9 15.8 14.6  HCT 46.4 47.7 42.9  PLT 190 169 175   Recent Labs  Lab 11/17/20 0345 11/19/20 0225 11/20/20 0602  NA 140 137 140  K 3.8 3.6 3.7  CL 104 106 102  CO2 25 23 28   BUN 12 11 9   CREATININE 0.78 0.78 0.80  CALCIUM 8.9 8.9 8.9  GLUCOSE 91 100* 94     Imaging/Diagnostic Tests:  IMPRESSIONS:  1. Left ventricular ejection fraction, by estimation, is 55 to 60%. The  left ventricle has normal function. The left ventricle has no regional  wall motion abnormalities. There is mild concentric left ventricular  hypertrophy. Left ventricular diastolic  parameters are consistent with Grade I diastolic dysfunction (impaired  relaxation).  2. Right ventricular systolic function is normal. The right ventricular  size is normal.  3. The mitral valve is normal in structure. No evidence of mitral valve  regurgitation. No evidence of mitral stenosis.  4. The aortic valve is tricuspid. Aortic valve regurgitation is not  visualized. No aortic stenosis is present.  5. Aortic dilatation noted. There is mild dilatation at  the level of the  sinuses of Valsalva, measuring 39 mm.  6. The inferior vena cava is normal in size with greater than 50%  respiratory variability, suggesting right atrial pressure of 3 mmHg.   Ryan Fuel, MD 11/20/2020, 1:59 PM PGY-1, [facility] Intern pager: [phone], text pages welcome

## 2020-11-20 NOTE — Progress Notes (Addendum)
Progress Note  Patient Name: Ryan Clarke Date of Encounter: 11/20/2020  [facility] Cardiologist: Buford Dresser, MD   Subjective   Feeling well. No chest pain, sob or palpitations. Waiting CTCS recommendations.   Inpatient Medications    Scheduled Meds: . aspirin EC  81 mg Oral Daily  . atorvastatin  80 mg Oral QHS  . isosorbide mononitrate  30 mg Oral Daily  . metoprolol succinate  50 mg Oral Daily  . pantoprazole  40 mg Oral Daily  . sodium chloride flush  3 mL Intravenous Q12H  . sodium chloride flush  3 mL Intravenous Q12H   Continuous Infusions: . sodium chloride Stopped (11/20/20 0230)  . sodium chloride    . heparin 1,100 Units/hr (11/20/20 0401)   PRN Meds: sodium chloride, acetaminophen, diazepam, ondansetron (ZOFRAN) IV, sodium chloride flush   Vital Signs    Vitals:   11/19/20 1518 11/19/20 2035 11/20/20 0611 11/20/20 0828  BP: 132/78 (!) 117/19 115/74   Pulse: 60 68    Resp: 16 18  18   Temp: 98 F (36.7 C) (!) 97.3 F (36.3 C)    TempSrc: Oral Oral  Oral  SpO2: 92% 94%    Weight:      Height:        Intake/Output Summary (Last 24 hours) at 11/20/2020 0829 Last data filed at 11/19/2020 1717 Gross per 24 hour  Intake 837.83 ml  Output --  Net 837.83 ml   Last 3 Weights 11/19/2020 11/17/2020 10/01/2020  Weight (lbs) 285 lb 0.9 oz 283 lb 8.2 oz 295 lb 6.4 oz  Weight (kg) 129.3 kg 128.6 kg 133.993 kg      Telemetry    SR in 60s - Personally Reviewed  ECG     N/A  Physical Exam   GEN: No acute distress.   Neck: No JVD Cardiac: RRR, no murmurs, rubs, or gallops. Right radial cath site without hematoma.  Respiratory: Clear to auscultation bilaterally. GI: Soft, nontender, non-distended  MS: No edema; No deformity. Neuro:  Nonfocal  Psych: Normal affect   Labs    High Sensitivity Troponin:   Recent Labs  Lab 11/16/20 2240 11/17/20 0618 11/17/20 1052 11/17/20 1445 11/17/20 1717  TROPONINIHS 8 63* 93* 106* 123*       Chemistry Recent Labs  Lab 11/17/20 0345 11/19/20 0225 11/20/20 0602  NA 140 137 140  K 3.8 3.6 3.7  CL 104 106 102  CO2 25 23 28   GLUCOSE 91 100* 94  BUN 12 11 9   CREATININE 0.78 0.78 0.80  CALCIUM 8.9 8.9 8.9  GFRNONAA >60 >60 >60  ANIONGAP 11 8 10      Hematology Recent Labs  Lab 11/18/20 0143 11/19/20 0225 11/20/20 0602  WBC 6.0 6.5 5.6  RBC 5.19 5.30 4.78  HGB 15.9 15.8 14.6  HCT 46.4 47.7 42.9  MCV 89.4 90.0 89.7  MCH 30.6 29.8 30.5  MCHC 34.3 33.1 34.0  RDW 12.1 11.9 12.1  PLT 190 169 175    BNP Recent Labs  Lab 11/17/20 0345  BNP 31.5     DDimer No results for input(s): DDIMER in the last 168 hours.   Radiology    CARDIAC CATHETERIZATION  Result Date: 11/19/2020  1st Mrg lesion is 40% stenosed.  Prox Cx to Mid Cx lesion is 80% stenosed.  Non-stenotic RPAV lesion was previously treated.  Ost RCA to Dist RCA lesion is 5% stenosed.  Dist LM to Ost LAD lesion is 80% stenosed.  Mid LAD lesion  is 60% stenosed with 60% stenosed side branch in 1st Sept.  Previously placed 2nd Diag stent (unknown type) is widely patent.  Multivessel CAD with stents in the LAD, diagonal, circumflex, circumflex marginal, and RCA extending to the PLA takeoff. The LAD has progressive 80% ostial stenosis which seems to occur immediately proximal to the proximal stent.  There is diffuse 60% narrowing in the proximal and mid stented segment.  The diagonal stent is patent.  The distal portion of the mid LAD stent is patent. Left circumflex stent extending from the ostium to the mid AV groove circumflex with focal eccentric 80% restenosis in the mid circumflex immediately after the takeoff of the marginal vessel.  There is 30 to 40% ostial narrowing in th marginal vessel which is stented ostially to proximally. The RCA is diffusely stented from the ostium to the takeoff of the PLA vessel.  There is mild luminal irregularity. Preserved global LV contractility with EF estimate at 18 mmHg.  RECOMMENDATION: The angiographic findings were reviewed with Dr. Gwenlyn Found in the catheterization laboratory.  With the ostial progression of 80% in the LAD as well as again restenosis in the circumflex vessel recommend surgical consultation for optimal long-term benefit.  We will hold prasugrel.  Increase anti-ischemic medications, optimal lipid management and BP control.   ECHOCARDIOGRAM COMPLETE  Result Date: 11/19/2020    ECHOCARDIOGRAM REPORT   Patient Name:   Ryan Clarke Date of Exam: 11/19/2020 Medical Rec #:  [id number]    Height:       73.0 in Accession #:    [id number]   Weight:       285.1 lb Date of Birth:  06-14-1956    BSA:          2.501 m Patient Age:    65 years     BP:           132/78 mmHg Patient Gender: M            HR:           62 bpm. Exam Location:  Inpatient Procedure: 2D Echo, Color Doppler and Cardiac Doppler Indications:    CAD Native Vessel i25.0  History:        Patient has no prior history of Echocardiogram examinations.                 CAD, Pacemaker, Arrythmias:Atrial Fibrillation; Risk                 Factors:Hypertension.  Sonographer:    Raquel Sarna Senior RDCS Referring Phys: [place]  1. Left ventricular ejection fraction, by estimation, is 55 to 60%. The left ventricle has normal function. The left ventricle has no regional wall motion abnormalities. There is mild concentric left ventricular hypertrophy. Left ventricular diastolic parameters are consistent with Grade I diastolic dysfunction (impaired relaxation).  2. Right ventricular systolic function is normal. The right ventricular size is normal.  3. The mitral valve is normal in structure. No evidence of mitral valve regurgitation. No evidence of mitral stenosis.  4. The aortic valve is tricuspid. Aortic valve regurgitation is not visualized. No aortic stenosis is present.  5. Aortic dilatation noted. There is mild dilatation at the level of the sinuses of Valsalva, measuring 39 mm.  6. The inferior vena  cava is normal in size with greater than 50% respiratory variability, suggesting right atrial pressure of 3 mmHg. FINDINGS  Left Ventricle: Left ventricular ejection fraction, by estimation, is 55 to 60%. The  left ventricle has normal function. The left ventricle has no regional wall motion abnormalities. The left ventricular internal cavity size was normal in size. There is  mild concentric left ventricular hypertrophy. Left ventricular diastolic parameters are consistent with Grade I diastolic dysfunction (impaired relaxation). Indeterminate filling pressures. Right Ventricle: The right ventricular size is normal. No increase in right ventricular wall thickness. Right ventricular systolic function is normal. Left Atrium: Left atrial size was normal in size. Right Atrium: Right atrial size was normal in size. Pericardium: There is no evidence of pericardial effusion. Mitral Valve: The mitral valve is normal in structure. No evidence of mitral valve regurgitation. No evidence of mitral valve stenosis. Tricuspid Valve: The tricuspid valve is normal in structure. Tricuspid valve regurgitation is trivial. No evidence of tricuspid stenosis. Aortic Valve: The aortic valve is tricuspid. Aortic valve regurgitation is not visualized. No aortic stenosis is present. Pulmonic Valve: The pulmonic valve was normal in structure. Pulmonic valve regurgitation is not visualized. No evidence of pulmonic stenosis. Aorta: Aortic dilatation noted. There is mild dilatation at the level of the sinuses of Valsalva, measuring 39 mm. Venous: The inferior vena cava was not well visualized. The inferior vena cava is normal in size with greater than 50% respiratory variability, suggesting right atrial pressure of 3 mmHg. IAS/Shunts: No atrial level shunt detected by color flow Doppler. Additional Comments: A pacer wire is visualized.  LEFT VENTRICLE PLAX 2D LVIDd:         5.10 cm  Diastology LVIDs:         3.60 cm  LV e' medial:    5.22 cm/s LV  PW:         1.30 cm  LV E/e' medial:  9.8 LV IVS:        1.27 cm  LV e' lateral:   5.44 cm/s LVOT diam:     2.50 cm  LV E/e' lateral: 9.4 LV SV:         94 LV SV Index:   37 LVOT Area:     4.91 cm  RIGHT VENTRICLE RV S prime:     12.10 cm/s TAPSE (M-mode): 2.5 cm LEFT ATRIUM             Index       RIGHT ATRIUM           Index LA diam:        4.00 cm 1.60 cm/m  RA Area:     15.60 cm LA Vol (A2C):   62.8 ml 25.11 ml/m RA Volume:   36.60 ml  14.63 ml/m LA Vol (A4C):   62.8 ml 25.11 ml/m LA Biplane Vol: 65.6 ml 26.23 ml/m  AORTIC VALVE LVOT Vmax:   81.10 cm/s LVOT Vmean:  56.600 cm/s LVOT VTI:    0.191 m  AORTA Ao Root diam: 3.90 cm Ao Asc diam:  3.60 cm MITRAL VALVE               TRICUSPID VALVE MV Area (PHT): 1.76 cm    TR Peak grad:   8.2 mmHg MV Decel Time: 430 msec    TR Vmax:        143.00 cm/s MV E velocity: 51.40 cm/s MV A velocity: 57.80 cm/s  SHUNTS MV E/A ratio:  0.89        Systemic VTI:  0.19 m  Systemic Diam: 2.50 cm Skeet Latch MD Electronically signed by Skeet Latch MD Signature Date/Time: 11/19/2020/4:44:06 PM    Final     Cardiac Studies   Echo 11/19/20 1. Left ventricular ejection fraction, by estimation, is 55 to 60%. The  left ventricle has normal function. The left ventricle has no regional  wall motion abnormalities. There is mild concentric left ventricular  hypertrophy. Left ventricular diastolic  parameters are consistent with Grade I diastolic dysfunction (impaired  relaxation).  2. Right ventricular systolic function is normal. The right ventricular  size is normal.  3. The mitral valve is normal in structure. No evidence of mitral valve  regurgitation. No evidence of mitral stenosis.  4. The aortic valve is tricuspid. Aortic valve regurgitation is not  visualized. No aortic stenosis is present.  5. Aortic dilatation noted. There is mild dilatation at the level of the  sinuses of Valsalva, measuring 39 mm.  6. The inferior vena  cava is normal in size with greater than 50%  respiratory variability, suggesting right atrial pressure of 3 mmHg.   LEFT HEART CATH AND CORONARY ANGIOGRAPHY    Conclusion    1st Mrg lesion is 40% stenosed.  Prox Cx to Mid Cx lesion is 80% stenosed.  Non-stenotic RPAV lesion was previously treated.  Ost RCA to Dist RCA lesion is 5% stenosed.  Dist LM to Ost LAD lesion is 80% stenosed.  Mid LAD lesion is 60% stenosed with 60% stenosed side branch in 1st Sept.  Previously placed 2nd Diag stent (unknown type) is widely patent.   Multivessel CAD with stents in the LAD, diagonal, circumflex, circumflex marginal, and RCA extending to the PLA takeoff.  The LAD has progressive 80% ostial stenosis which seems to occur immediately proximal to the proximal stent.  There is diffuse 60% narrowing in the proximal and mid stented segment.  The diagonal stent is patent.  The distal portion of the mid LAD stent is patent.  Left circumflex stent extending from the ostium to the mid AV groove circumflex with focal eccentric 80% restenosis in the mid circumflex immediately after the takeoff of the marginal vessel.  There is 30 to 40% ostial narrowing in th marginal vessel which is stented ostially to proximally.  The RCA is diffusely stented from the ostium to the takeoff of the PLA vessel.  There is mild luminal irregularity.  Preserved global LV contractility with EF estimate at 18 mmHg.  RECOMMENDATION: The angiographic findings were reviewed with Dr. Gwenlyn Found in the catheterization laboratory.  With the ostial progression of 80% in the LAD as well as again restenosis in the circumflex vessel recommend surgical consultation for optimal long-term benefit.  We will hold prasugrel.  Increase anti-ischemic medications, optimal lipid management and BP control. Diagnostic Dominance: Right Diagnostic Dominance: Right    Intervention     Patient Profile     65 y.o. male malewith a hx of  CAD s/p multiple stents with recently POBA in 10/21, s/p  pacemaker, PAF, HLD, HTN and strong family hx of CAD (multiple family member died in 66s after CABG) presented for Chest pain.   Assessment & Plan    1. [place] - Mixed symptoms. His prior anginal symptoms were abdominal discomfort. This time he has had chest pressure and nausea>> Took SL nitro with improved symptoms but symptoms reoccur.  - Hs-troponin 8>>63>>93>>106>>123 - Treated with IV heparin - Cath showed 80% ostial LAD stenosis and restenosis of mCX. Echo with preserved LVEF.  - Pending surgical evaluation -  Prasugrel on hold - Continue ASA, BB and statin   2. HLD - 08/17/2020: Cholesterol, Total 96; HDL 32; LDL Chol Calc (NIH) 35; Triglycerides 174  - Continue Lipitor 80mg  qd  3. HTN - BP relatively stable - Continue BB  4. PAF - Found to have Afib on device check in 08/2020. CHADSVASCs score of 2. Needs to discuss NOAC prior to discharge. Discussed with Dr. 09/2020 yesterday briefly who said okay to start anticoagulation if deemed appropriate by rounding MD.   5. S/p PPM - Followed by Dr. Cristal Deer    For questions or updates, please contact [facility] Please consult [email or web address] for contact info under        Signed, Graciela Husbands, PA  11/20/2020, 8:29 AM    Agree with note by 01/18/2021 PA-C  Status post cardiac catheterization performed by Dr. Chelsea Aus yesterday revealing restenosis within the mid AV groove circumflex and high-grade ostial LAD disease.  The right coronary artery is completely stented but free of significant disease.  LV function is normal.  After review of his angiograms with Dr. Tresa Endo the consensus was that he would benefit from CABG.  His prasugrel is on hold.  T CTS will evaluate.  Given the fact that he is not a diabetic he may be a candidate for bilateral internal mammary artery graft for long-term patency.  He is on IV heparin currently.  Discussion surrounding whether or not to add  oral anticoagulation given his PAF demonstrated on pacemaker interrogation can be delayed until his return office visit with Dr. Tresa Endo.   Cristal Deer, M.D., FACP, [facility], [facility] [address] [place], [place]  [place]  [phone] 11/20/2020 10:11 AM

## 2020-11-20 NOTE — Plan of Care (Signed)

## 2020-11-20 NOTE — Progress Notes (Signed)
ANTICOAGULATION CONSULT NOTE - Follow Up Consult  Pharmacy Consult for heparin Indication: chest pain/ACS  No Known Allergies  Patient Measurements: Height: 6\' 1"  (185.4 cm) Weight: 129.3 kg (285 lb 0.9 oz) IBW/kg (Calculated) : 79.9 Heparin Dosing Weight: 108.5 kg  Vital Signs: Temp: 98.7 F (37.1 C) (01/04 0828) Temp Source: Oral (01/04 0828) BP: 104/71 (01/04 0828)  Labs: Recent Labs     0000 11/17/20 1445 11/17/20 1717 11/18/20 0143 11/18/20 1016 11/18/20 2106 11/19/20 0225 11/20/20 0602 11/20/20 0957  HGB   < >  --   --  15.9  --   --  15.8 14.6  --   HCT  --   --   --  46.4  --   --  47.7 42.9  --   PLT  --   --   --  190  --   --  169 175  --   HEPARINUNFRC  --   --   --  0.74*   < > 0.52 0.62  --  0.47  CREATININE  --   --   --   --   --   --  0.78 0.80  --   TROPONINIHS  --  106* 123*  --   --   --   --   --   --    < > = values in this interval not displayed.    Estimated Creatinine Clearance: 131.5 mL/min (by C-G formula based on SCr of 0.8 mg/dL).   Assessment: 65 yo male with a history of CAD s/p 30 stents, paroxysmal atrial fibrillation, and HTN presents with unstable angina. PTA the patient is not on anticoagulation. The pt is s/p LHC with plans for surgical consultation, pharmacy to resume heparin 10h after sheath pull.  Patient's heparin level is therapeutic at 0.47 on 1100 units/hr. CBC stable/WNL. Patient's line was beeping frequently but level is therapeutic. Planning for TCTS consult.    Goal of Therapy:  Heparin level 0.3-0.7 units/ml Monitor platelets by anticoagulation protocol: Yes   Plan:  Continue heparin at 1100 units/hr Daily heparin level, CBC Monitor s/s bleeding  77, PharmD PGY1 Acute Care Pharmacy Resident 11/20/2020 11:33 AM  Please check [email or web address] for unit specific pharmacy phone numbers.

## 2020-11-21 ENCOUNTER — Inpatient Hospital Stay (HOSPITAL_COMMUNITY): Payer: Medicare HMO

## 2020-11-21 LAB — PULMONARY FUNCTION TEST
FEF 25-75 Pre: 3.87 L/sec
FEF2575-%Pred-Pre: 126 %
FEV1-%Pred-Pre: 92 %
FEV1-Pre: 3.59 L
FEV1FVC-%Pred-Pre: 109 %
FEV6-%Pred-Pre: 87 %
FEV6-Pre: 4.3 L
FEV6FVC-%Pred-Pre: 102 %
FVC-%Pred-Pre: 84 %
FVC-Pre: 4.38 L
Pre FEV1/FVC ratio: 82 %
Pre FEV6/FVC Ratio: 98 %

## 2020-11-21 LAB — CBC
HCT: 43.4 % (ref 39.0–52.0)
Hemoglobin: 14.9 g/dL (ref 13.0–17.0)
MCH: 30.8 pg (ref 26.0–34.0)
MCHC: 34.3 g/dL (ref 30.0–36.0)
MCV: 89.7 fL (ref 80.0–100.0)
Platelets: 165 10*3/uL (ref 150–400)
RBC: 4.84 MIL/uL (ref 4.22–5.81)
RDW: 12.2 % (ref 11.5–15.5)
WBC: 6.3 10*3/uL (ref 4.0–10.5)
nRBC: 0 % (ref 0.0–0.2)

## 2020-11-21 LAB — HEPARIN LEVEL (UNFRACTIONATED): Heparin Unfractionated: 0.62 IU/mL (ref 0.30–0.70)

## 2020-11-21 MED ORDER — METOPROLOL SUCCINATE ER 25 MG PO TB24
25.0000 mg | ORAL_TABLET | Freq: Every day | ORAL | Status: DC
  Administered 2020-11-21 – 2020-11-25 (×5): 25 mg via ORAL
  Filled 2020-11-21 (×5): qty 1

## 2020-11-21 NOTE — Progress Notes (Signed)
Family Medicine Teaching Service Daily Progress Note Intern Pager: [phone]  Patient name: Ryan Clarke Medical record number: [id number] Date of birth: April 29, 1956 Age: 65 y.o. Gender: male  Primary Care Provider: [name], MD Consultants: Cardiology, TCTS Code Status: Full  Pt Overview and Major Events to Date:  11/17/2020- admitted for unstable angina 11/19/2020- Left Heart Cath  Assessment and Plan: Ryan Clarke a 66 y.o.maleadmitted for unstable angina and awaiting left heart cath.PMH is significant for CAD s/p29-30 stents, PAF,andHTN.  Unstable Angina  H/o extensive CAD and multiple stents Patient reports no current symptoms.  Had LVH on 11/19/20 that showed 80% ostial LAD stenosis and restenosis of mCX. Echo with preserved LVEF.  Cards following.  TCTS saw yesterday and has patient scheduled for CABG on 11/26/20.    - Cardiology & TCTS following, appreciate Recs - Continue to hold Prasugrel - Continue ASA, BB and statin - Continue Daily Imdur - Protonix 40mg   - Cardiac monitoring - VS per protocol - Heparin for DVT PPX as well as ACS tx  HTN Chronic and stable.Patient has been well controlled overall. BP slightly elevated at 142/87. Home medication is metoprolol 25 mg. - Decrease dose to homeToprol XL 25mg  daily  PAF Currently normal rate and rhthym. Normal Sinus seen on EKG yesterday. Patient has been sinus brady during this admissionand no evidence of recurrent atrial fibrillation. Recommend patient follow up with cardiology as outpatient. - Continue Toprol XL 25mg  daily   FEN/GI: Carb Modified Diet [id number] drip  Status is: Inpatient  Remains inpatient appropriate because:Ongoing diagnostic testing needed not appropriate for outpatient work up   Dispo:  Patient From: Home  Planned Disposition: Home  Expected discharge date: 11/29/2020  Medically stable for discharge: No    Subjective:  Patient indicates is feeling well this morning.   Does not have any chest pain, pressure or tightness.   Objective: Temp:  [97.6 F (36.4 C)-98 F (36.7 C)] 97.6 F (36.4 C) (01/05 1420) Pulse Rate:  [50-65] 65 (01/05 1420) Resp:  [18-19] 18 (01/05 1420) BP: (106-150)/(63-90) 114/79 (01/05 1420) SpO2:  [95 %-99 %] 95 % (01/05 1420) Physical Exam:  Physical Exam Constitutional:      Appearance: He is well-developed.  HENT:     Head: Normocephalic and atraumatic.  Cardiovascular:     Rate and Rhythm: Normal rate and regular rhythm.     Pulses:          Radial pulses are 2+ on the right side and 2+ on the left side.       Dorsalis pedis pulses are 2+ on the right side and 2+ on the left side.       Posterior tibial pulses are 2+ on the right side and 2+ on the left side.  Pulmonary:     Effort: Pulmonary effort is normal.     Breath sounds: Normal breath sounds.  Neurological:     Mental Status: He is alert.     Laboratory: Recent Labs  Lab 11/19/20 0225 11/20/20 0602 11/21/20 0417  WBC 6.5 5.6 6.3  HGB 15.8 14.6 14.9  HCT 47.7 42.9 43.4  PLT 169 175 165   Recent Labs  Lab 11/17/20 0345 11/19/20 0225 11/20/20 0602  NA 140 137 140  K 3.8 3.6 3.7  CL 104 106 102  CO2 25 23 28   BUN 12 11 9   CREATININE 0.78 0.78 0.80  CALCIUM 8.9 8.9 8.9  GLUCOSE 91 100* 94      Imaging/Diagnostic Tests:  PREOPERATIVE  VASCULAR EVALUATION  Indications:   Pre-CABG.  Risk Factors:   Hypertension, coronary artery disease.  Comparison Study: No prior studies.   Performing Technologist: Ryan Clarke RVT, RDMS   Supporting Technologist: Ryan Clarke RDMS     Examination Guidelines: A complete evaluation includes B-mode imaging,  spectral  Doppler, color Doppler, and power Doppler as needed of all accessible  portions  of each vessel. Bilateral testing is considered an integral part of a  complete  examination. Limited examinations for reoccurring indications may be  performed  as noted.   Summary:  Right  Carotid: Velocities in the right ICA are consistent with a 1-39%  stenosis.   Left Carotid: Velocities in the left ICA are consistent with a 1-39%  stenosis.   Right ABI: Resting right ankle-brachial index is within normal range. No  evidence of significant right lower extremity arterial disease.  Left ABI: Resting left ankle-brachial index is within normal range. No  evidence of significant left lower extremity arterial disease.  Right Upper Extremity: Doppler waveforms remain within normal limits with  right radial compression. Doppler waveforms remain within normal limits  with right ulnar compression.  Left Upper Extremity: Doppler waveforms remain within normal limits with  left radial compression. Doppler waveforms remain within normal limits  with left ulnar compression.   Ryan Kussmaul, MD 11/21/2020, 2:50 PM PGY-1, [facility] FPTS Intern pager: [phone], text pages welcome

## 2020-11-21 NOTE — Progress Notes (Signed)
ANTICOAGULATION CONSULT NOTE - Follow Up Consult  Pharmacy Consult for heparin Indication: chest pain/ACS  No Known Allergies  Patient Measurements: Height: 6\' 1"  (185.4 cm) Weight: 129.3 kg (285 lb 0.9 oz) IBW/kg (Calculated) : 79.9 Heparin Dosing Weight: 108.5 kg  Vital Signs: Temp: 97.8 F (36.6 C) (01/05 0801) Temp Source: Oral (01/05 0801) BP: 150/90 (01/05 0801)  Labs: Recent Labs    11/19/20 0225 11/20/20 0602 11/20/20 0957 11/21/20 0417  HGB 15.8 14.6  --  14.9  HCT 47.7 42.9  --  43.4  PLT 169 175  --  165  HEPARINUNFRC 0.62  --  0.47 0.62  CREATININE 0.78 0.80  --   --     Estimated Creatinine Clearance: 131.5 mL/min (by C-G formula based on SCr of 0.8 mg/dL).   Assessment: 65 yo male with a history of CAD s/p 30 stents, paroxysmal atrial fibrillation, and HTN presents with unstable angina. PTA the patient is not on anticoagulation. The pt is s/p LHC with plans for surgical consultation, pharmacy to resume heparin 10h after sheath pull.  Patient's heparin level continues to be therapeutic at 0.62 on 1100 units/hr. CBC stable/WNL. Planning for CABG 1/10, pending cardiology recommendations for continuing heparin.   Goal of Therapy:  Heparin level 0.3-0.7 units/ml Monitor platelets by anticoagulation protocol: Yes   Plan:  Continue heparin at 1100 units/hr Daily heparin level, CBC Monitor s/s bleeding  77, PharmD PGY1 Acute Care Pharmacy Resident 11/21/2020 8:28 AM  Please check [email or web address] for unit specific pharmacy phone numbers.

## 2020-11-21 NOTE — Progress Notes (Addendum)
Progress Note  Patient Name: Ryan Clarke Date of Encounter: 11/21/2020  [facility] Cardiologist: Buford Dresser, MD   Subjective   Walked in the hallway with CR without chest pain. Feels well this morning.   Inpatient Medications    Scheduled Meds: . aspirin EC  81 mg Oral Daily  . atorvastatin  80 mg Oral QHS  . isosorbide mononitrate  30 mg Oral Daily  . metoprolol succinate  25 mg Oral Daily  . pantoprazole  40 mg Oral Daily  . sodium chloride flush  3 mL Intravenous Q12H  . sodium chloride flush  3 mL Intravenous Q12H   Continuous Infusions: . sodium chloride Stopped (11/20/20 0230)  . sodium chloride    . heparin 1,100 Units/hr (11/21/20 0817)   PRN Meds: sodium chloride, acetaminophen, diazepam, ondansetron (ZOFRAN) IV, sodium chloride flush   Vital Signs    Vitals:   11/20/20 1425 11/20/20 1731 11/20/20 1939 11/21/20 0801  BP: 103/74 106/63 109/76 (!) 150/90  Pulse:  (!) 50 (!) 55   Resp: 20 18 19 18   Temp: 98.3 F (36.8 C) 98 F (36.7 C) 97.8 F (36.6 C) 97.8 F (36.6 C)  TempSrc: Oral Oral Oral Oral  SpO2:  95% 99% 98%  Weight:      Height:        Intake/Output Summary (Last 24 hours) at 11/21/2020 0838 Last data filed at 11/21/2020 0752 Gross per 24 hour  Intake 1401.38 ml  Output --  Net 1401.38 ml   Last 3 Weights 11/19/2020 11/17/2020 10/01/2020  Weight (lbs) 285 lb 0.9 oz 283 lb 8.2 oz 295 lb 6.4 oz  Weight (kg) 129.3 kg 128.6 kg 133.993 kg      Telemetry    SB HR-40-50s - Personally Reviewed  ECG    No new tracing  Physical Exam  Pleasant older male GEN: No acute distress.   Neck: No JVD Cardiac: RRR, no murmurs, rubs, or gallops.  Respiratory: Clear to auscultation bilaterally. GI: Soft, nontender, non-distended  MS: No edema; No deformity. Right radial cath site stable.  Neuro:  Nonfocal  Psych: Normal affect   Labs    High Sensitivity Troponin:   Recent Labs  Lab 11/16/20 2240 11/17/20 0618 11/17/20 1052  11/17/20 1445 11/17/20 1717  TROPONINIHS 8 63* 93* 106* 123*      Chemistry Recent Labs  Lab 11/17/20 0345 11/19/20 0225 11/20/20 0602  NA 140 137 140  K 3.8 3.6 3.7  CL 104 106 102  CO2 25 23 28   GLUCOSE 91 100* 94  BUN 12 11 9   CREATININE 0.78 0.78 0.80  CALCIUM 8.9 8.9 8.9  GFRNONAA >60 >60 >60  ANIONGAP 11 8 10      Hematology Recent Labs  Lab 11/19/20 0225 11/20/20 0602 11/21/20 0417  WBC 6.5 5.6 6.3  RBC 5.30 4.78 4.84  HGB 15.8 14.6 14.9  HCT 47.7 42.9 43.4  MCV 90.0 89.7 89.7  MCH 29.8 30.5 30.8  MCHC 33.1 34.0 34.3  RDW 11.9 12.1 12.2  PLT 169 175 165    BNP Recent Labs  Lab 11/17/20 0345  BNP 31.5     DDimer No results for input(s): DDIMER in the last 168 hours.   Radiology    CARDIAC CATHETERIZATION  Result Date: 11/19/2020  1st Mrg lesion is 40% stenosed.  Prox Cx to Mid Cx lesion is 80% stenosed.  Non-stenotic RPAV lesion was previously treated.  Ost RCA to Dist RCA lesion is 5% stenosed.  Dist LM  to [facility] LAD lesion is 80% stenosed.  Mid LAD lesion is 60% stenosed with 60% stenosed side branch in 1st Sept.  Previously placed 2nd Diag stent (unknown type) is widely patent.  Multivessel CAD with stents in the LAD, diagonal, circumflex, circumflex marginal, and RCA extending to the PLA takeoff. The LAD has progressive 80% ostial stenosis which seems to occur immediately proximal to the proximal stent.  There is diffuse 60% narrowing in the proximal and mid stented segment.  The diagonal stent is patent.  The distal portion of the mid LAD stent is patent. Left circumflex stent extending from the ostium to the mid AV groove circumflex with focal eccentric 80% restenosis in the mid circumflex immediately after the takeoff of the marginal vessel.  There is 30 to 40% ostial narrowing in th marginal vessel which is stented ostially to proximally. The RCA is diffusely stented from the ostium to the takeoff of the PLA vessel.  There is mild luminal  irregularity. Preserved global LV contractility with EF estimate at 18 mmHg. RECOMMENDATION: The angiographic findings were reviewed with Dr. Gwenlyn Found in the catheterization laboratory.  With the ostial progression of 80% in the LAD as well as again restenosis in the circumflex vessel recommend surgical consultation for optimal long-term benefit.  We will hold prasugrel.  Increase anti-ischemic medications, optimal lipid management and BP control.   ECHOCARDIOGRAM COMPLETE  Result Date: 11/19/2020    ECHOCARDIOGRAM REPORT   Patient Name:   [name] Date of Exam: 11/19/2020 Medical Rec #:  [id number]    Height:       73.0 in Accession #:    [id number]   Weight:       285.1 lb Date of Birth:  10/25/1956    BSA:          2.501 m Patient Age:    65 years     BP:           132/78 mmHg Patient Gender: M            HR:           62 bpm. Exam Location:  Inpatient Procedure: 2D Echo, Color Doppler and Cardiac Doppler Indications:    CAD Native Vessel i25.0  History:        Patient has no prior history of Echocardiogram examinations.                 CAD, Pacemaker, Arrythmias:Atrial Fibrillation; Risk                 Factors:Hypertension.  Sonographer:    Raquel Sarna Senior RDCS Referring Phys: [place]  1. Left ventricular ejection fraction, by estimation, is 55 to 60%. The left ventricle has normal function. The left ventricle has no regional wall motion abnormalities. There is mild concentric left ventricular hypertrophy. Left ventricular diastolic parameters are consistent with Grade I diastolic dysfunction (impaired relaxation).  2. Right ventricular systolic function is normal. The right ventricular size is normal.  3. The mitral valve is normal in structure. No evidence of mitral valve regurgitation. No evidence of mitral stenosis.  4. The aortic valve is tricuspid. Aortic valve regurgitation is not visualized. No aortic stenosis is present.  5. Aortic dilatation noted. There is mild dilatation at the  level of the sinuses of Valsalva, measuring 39 mm.  6. The inferior vena cava is normal in size with greater than 50% respiratory variability, suggesting right atrial pressure of 3 mmHg. FINDINGS  Left Ventricle:  Left ventricular ejection fraction, by estimation, is 55 to 60%. The left ventricle has normal function. The left ventricle has no regional wall motion abnormalities. The left ventricular internal cavity size was normal in size. There is  mild concentric left ventricular hypertrophy. Left ventricular diastolic parameters are consistent with Grade I diastolic dysfunction (impaired relaxation). Indeterminate filling pressures. Right Ventricle: The right ventricular size is normal. No increase in right ventricular wall thickness. Right ventricular systolic function is normal. Left Atrium: Left atrial size was normal in size. Right Atrium: Right atrial size was normal in size. Pericardium: There is no evidence of pericardial effusion. Mitral Valve: The mitral valve is normal in structure. No evidence of mitral valve regurgitation. No evidence of mitral valve stenosis. Tricuspid Valve: The tricuspid valve is normal in structure. Tricuspid valve regurgitation is trivial. No evidence of tricuspid stenosis. Aortic Valve: The aortic valve is tricuspid. Aortic valve regurgitation is not visualized. No aortic stenosis is present. Pulmonic Valve: The pulmonic valve was normal in structure. Pulmonic valve regurgitation is not visualized. No evidence of pulmonic stenosis. Aorta: Aortic dilatation noted. There is mild dilatation at the level of the sinuses of Valsalva, measuring 39 mm. Venous: The inferior vena cava was not well visualized. The inferior vena cava is normal in size with greater than 50% respiratory variability, suggesting right atrial pressure of 3 mmHg. IAS/Shunts: No atrial level shunt detected by color flow Doppler. Additional Comments: A pacer wire is visualized.  LEFT VENTRICLE PLAX 2D LVIDd:          5.10 cm  Diastology LVIDs:         3.60 cm  LV e' medial:    5.22 cm/s LV PW:         1.30 cm  LV E/e' medial:  9.8 LV IVS:        1.27 cm  LV e' lateral:   5.44 cm/s LVOT diam:     2.50 cm  LV E/e' lateral: 9.4 LV SV:         94 LV SV Index:   37 LVOT Area:     4.91 cm  RIGHT VENTRICLE RV S prime:     12.10 cm/s TAPSE (M-mode): 2.5 cm LEFT ATRIUM             Index       RIGHT ATRIUM           Index LA diam:        4.00 cm 1.60 cm/m  RA Area:     15.60 cm LA Vol (A2C):   62.8 ml 25.11 ml/m RA Volume:   36.60 ml  14.63 ml/m LA Vol (A4C):   62.8 ml 25.11 ml/m LA Biplane Vol: 65.6 ml 26.23 ml/m  AORTIC VALVE LVOT Vmax:   81.10 cm/s LVOT Vmean:  56.600 cm/s LVOT VTI:    0.191 m  AORTA Ao Root diam: 3.90 cm Ao Asc diam:  3.60 cm MITRAL VALVE               TRICUSPID VALVE MV Area (PHT): 1.76 cm    TR Peak grad:   8.2 mmHg MV Decel Time: 430 msec    TR Vmax:        143.00 cm/s MV E velocity: 51.40 cm/s MV A velocity: 57.80 cm/s  SHUNTS MV E/A ratio:  0.89        Systemic VTI:  0.19 m  Systemic Diam: 2.50 cm Chilton Si MD Electronically signed by Chilton Si MD Signature Date/Time: 11/19/2020/4:44:06 PM    Final    VAS US DOPPLER PRE CABG  Result Date: 11/20/2020 PREOPERATIVE VASCULAR EVALUATION  Indications:      Pre-CABG. Risk Factors:     Hypertension, coronary artery disease. Comparison Study: No prior studies. Performing Technologist: Ernestene Mention RVT, RDMS Supporting Technologist: Jean Rosenthal RDMS  Examination Guidelines: A complete evaluation includes B-mode imaging, spectral Doppler, color Doppler, and power Doppler as needed of all accessible portions of each vessel. Bilateral testing is considered an integral part of a complete examination. Limited examinations for reoccurring indications may be performed as noted.  Right Carotid Findings: +----------+--------+--------+--------+-----------------------+--------+           PSV cm/sEDV cm/sStenosisDescribe                Comments +----------+--------+--------+--------+-----------------------+--------+ CCA Prox  54      11                                              +----------+--------+--------+--------+-----------------------+--------+ CCA Distal56      14                                              +----------+--------+--------+--------+-----------------------+--------+ ICA Prox  43      17      1-39%   heterogenous and smooth         +----------+--------+--------+--------+-----------------------+--------+ ICA Distal64      25                                              +----------+--------+--------+--------+-----------------------+--------+ ECA       86      12                                              +----------+--------+--------+--------+-----------------------+--------+ Portions of this table do not appear on this page. +----------+--------+-------+----------------+------------+           PSV cm/sEDV cmsDescribe        Arm Pressure +----------+--------+-------+----------------+------------+ Subclavian102            Multiphasic, WNL             +----------+--------+-------+----------------+------------+ +---------+--------+--+--------+--+---------+ VertebralPSV cm/s33EDV cm/s11Antegrade +---------+--------+--+--------+--+---------+ Left Carotid Findings: +----------+--------+--------+--------+--------+--------+           PSV cm/sEDV cm/sStenosisDescribeComments +----------+--------+--------+--------+--------+--------+ CCA Prox  73      11                               +----------+--------+--------+--------+--------+--------+ CCA Distal57      9                                +----------+--------+--------+--------+--------+--------+ ICA Prox  49      19      1-39%   smooth           +----------+--------+--------+--------+--------+--------+  ICA Distal71      31                                +----------+--------+--------+--------+--------+--------+ ECA       75      10                               +----------+--------+--------+--------+--------+--------+ +----------+--------+--------+----------------+------------+ SubclavianPSV cm/sEDV cm/sDescribe        Arm Pressure +----------+--------+--------+----------------+------------+           119             Multiphasic, WNL             +----------+--------+--------+----------------+------------+ +---------+--------+--+--------+--+---------+ VertebralPSV cm/s31EDV cm/s10Antegrade +---------+--------+--+--------+--+---------+  ABI Findings: +--------+------------------+-----+---------+--------+ Right   Rt Pressure (mmHg)IndexWaveform Comment  +--------+------------------+-----+---------+--------+ [id number]                    triphasic         +--------+------------------+-----+---------+--------+ PTA     154               1.17 triphasic         +--------+------------------+-----+---------+--------+ DP      143               1.08 triphasic         +--------+------------------+-----+---------+--------+ +--------+------------------+-----+---------+-------+ Left    Lt Pressure (mmHg)IndexWaveform Comment +--------+------------------+-----+---------+-------+ [id number]                    triphasic        +--------+------------------+-----+---------+-------+ PTA     162               1.23 triphasic        +--------+------------------+-----+---------+-------+ DP      132               1.00 triphasic        +--------+------------------+-----+---------+-------+ +-------+---------------+----------------+ ABI/TBIToday's ABI/TBIPrevious ABI/TBI +-------+---------------+----------------+ Right  1.17                            +-------+---------------+----------------+ Left   1.23                            +-------+---------------+----------------+  Right Doppler Findings:  +--------+--------+-----+---------+--------+ Site    PressureIndexDoppler  Comments +--------+--------+-----+---------+--------+ [id number]          triphasic         +--------+--------+-----+---------+--------+ Radial               triphasic         +--------+--------+-----+---------+--------+ Ulnar                triphasic         +--------+--------+-----+---------+--------+  Left Doppler Findings: +--------+--------+-----+---------+--------+ Site    PressureIndexDoppler  Comments +--------+--------+-----+---------+--------+ [id number]          triphasic         +--------+--------+-----+---------+--------+ Radial               triphasic         +--------+--------+-----+---------+--------+ Ulnar                triphasic         +--------+--------+-----+---------+--------+  Summary: Right Carotid: Velocities in the right ICA are consistent with a 1-39% stenosis.  Left Carotid: Velocities in the left ICA are consistent with a 1-39% stenosis. Right ABI: Resting right ankle-brachial index is within normal range. No evidence of significant right lower extremity arterial disease. Left ABI: Resting left ankle-brachial index is within normal range. No evidence of significant left lower extremity arterial disease. Right Upper Extremity: Doppler waveforms remain within normal limits with right radial compression. Doppler waveforms remain within normal limits with right ulnar compression. Left Upper Extremity: Doppler waveforms remain within normal limits with left radial compression. Doppler waveforms remain within normal limits with left ulnar compression.  Electronically signed by Curt Jews MD on 11/20/2020 at 2:00:12 PM.    Final     Cardiac Studies   Cath: 11/19/20   1st Mrg lesion is 40% stenosed.  Prox Cx to Mid Cx lesion is 80% stenosed.  Non-stenotic RPAV lesion was previously treated.  Ost RCA to Dist RCA lesion is 5% stenosed.  Dist LM to Ost LAD lesion  is 80% stenosed.  Mid LAD lesion is 60% stenosed with 60% stenosed side branch in 1st Sept.  Previously placed 2nd Diag stent (unknown type) is widely patent.   Multivessel CAD with stents in the LAD, diagonal, circumflex, circumflex marginal, and RCA extending to the PLA takeoff.  The LAD has progressive 80% ostial stenosis which seems to occur immediately proximal to the proximal stent.  There is diffuse 60% narrowing in the proximal and mid stented segment.  The diagonal stent is patent.  The distal portion of the mid LAD stent is patent.  Left circumflex stent extending from the ostium to the mid AV groove circumflex with focal eccentric 80% restenosis in the mid circumflex immediately after the takeoff of the marginal vessel.  There is 30 to 40% ostial narrowing in th marginal vessel which is stented ostially to proximally.  The RCA is diffusely stented from the ostium to the takeoff of the PLA vessel.  There is mild luminal irregularity.  Preserved global LV contractility with EF estimate at 18 mmHg.  RECOMMENDATION: The angiographic findings were reviewed with Dr. Gwenlyn Found in the catheterization laboratory.  With the ostial progression of 80% in the LAD as well as again restenosis in the circumflex vessel recommend surgical consultation for optimal long-term benefit.  We will hold prasugrel.  Increase anti-ischemic medications, optimal lipid management and BP control. Diagnostic Dominance: Right   Echo: 11/19/20  IMPRESSIONS    1. Left ventricular ejection fraction, by estimation, is 55 to 60%. The  left ventricle has normal function. The left ventricle has no regional  wall motion abnormalities. There is mild concentric left ventricular  hypertrophy. Left ventricular diastolic  parameters are consistent with Grade I diastolic dysfunction (impaired  relaxation).  2. Right ventricular systolic function is normal. The right ventricular  size is normal.  3. The mitral valve  is normal in structure. No evidence of mitral valve  regurgitation. No evidence of mitral stenosis.  4. The aortic valve is tricuspid. Aortic valve regurgitation is not  visualized. No aortic stenosis is present.  5. Aortic dilatation noted. There is mild dilatation at the level of the  sinuses of Valsalva, measuring 39 mm.  6. The inferior vena cava is normal in size with greater than 50%  respiratory variability, suggesting right atrial pressure of 3 mmHg.   Patient Profile     65 y.o. male with a hx of CADs/p multiplestents withrecently POBA in 10/21, s/ppacemaker, PAF, HLD, HTNand strong family hx of CAD (multiple family member died in 68s  after CABG) presented for Chest pain.  Assessment & Plan   1. Unstable angina: Mixed symptoms. His prior anginal symptoms were abdominal discomfort. hsTn peaked at 123. Underwent cardiac cath noted above with 80% ostial LAD stenosis and restenosis of the mLcx. Seen by Dr. Cyndia Bent with plans for CABG on 1/10. Will discuss with Dr. Gwenlyn Found regarding whether patient to remain inpatient while awaiting CABG.  -- remains on IV heparin, ASA, statin, BB.   2. HLD: 08/17/2020: Cholesterol, Total 96; HDL 32; LDL Chol Calc (NIH) 35; Triglycerides 174 -- Continue Lipitor 80mg  qd  3. HTN: BP relatively stable -- Continue BB  4. PAF: Found to have Afib on device check in 08/2020. CHADSVASCs score of 2. Will need future discussion regarding [place].  -- remains on IV heparin  5. S/p PPM: St Jude -- Followed by Dr. Caryl Comes    For questions or updates, please contact [facility] Please consult [email or web address] for contact info under        Signed, Reino Bellis, NP  11/21/2020, 8:38 AM     Agree with note by Reino Bellis NP-C  Patient denies chest pain or shortness of breath.  He is on IV heparin.  Appreciate Dr. Cyndia Bent consult and willingness to use arterial conduits for bypass surgery.  The patient is off Effient.  I believe it would be  prudent and safe to keep him in the hospital IV heparin until his surgery given the number of stents he has.  Lorretta Harp, M.D., [place], [facility], [place] [address] [place],   [address]  [phone] 11/21/2020 9:59 AM

## 2020-11-21 NOTE — Progress Notes (Signed)
CARDIAC REHAB PHASE I   PRE:  Rate/Rhythm: 53 SB    BP: sitting 145/91    SaO2:   MODE:  Ambulation: 470 ft   POST:  Rate/Rhythm: 70 SR    BP: sitting 155/89     SaO2:   Pt ambulated hall independently without CP. BP elevated, just received meds recently per pt. Discussed IS (2500+ mL), sternal precautions, mobility post op, and d/c planning. Pt receptive. His fiance can be with him at d/c postop. Gave him materials to review. He can ambulate independently if no d/c.   [id number]  Harriet Masson CES, ACSM 11/21/2020 9:09 AM

## 2020-11-22 ENCOUNTER — Encounter (HOSPITAL_COMMUNITY): Payer: Self-pay

## 2020-11-22 DIAGNOSIS — E782 Mixed hyperlipidemia: Secondary | ICD-10-CM

## 2020-11-22 DIAGNOSIS — I10 Essential (primary) hypertension: Secondary | ICD-10-CM

## 2020-11-22 LAB — HEPARIN LEVEL (UNFRACTIONATED): Heparin Unfractionated: 0.3 IU/mL (ref 0.30–0.70)

## 2020-11-22 LAB — CBC
HCT: 45.4 % (ref 39.0–52.0)
Hemoglobin: 15.7 g/dL (ref 13.0–17.0)
MCH: 30.5 pg (ref 26.0–34.0)
MCHC: 34.6 g/dL (ref 30.0–36.0)
MCV: 88.3 fL (ref 80.0–100.0)
Platelets: 166 10*3/uL (ref 150–400)
RBC: 5.14 MIL/uL (ref 4.22–5.81)
RDW: 12.1 % (ref 11.5–15.5)
WBC: 5.7 10*3/uL (ref 4.0–10.5)
nRBC: 0 % (ref 0.0–0.2)

## 2020-11-22 MED ORDER — AMLODIPINE BESYLATE 5 MG PO TABS
5.0000 mg | ORAL_TABLET | Freq: Every day | ORAL | Status: DC
  Administered 2020-11-22 – 2020-11-25 (×4): 5 mg via ORAL
  Filled 2020-11-22 (×4): qty 1

## 2020-11-22 NOTE — Progress Notes (Signed)
Progress Note  Patient Name: Ryan Clarke Date of Encounter: 11/22/2020  [facility] Cardiologist: Buford Dresser, MD   Subjective   No issues overnight.  He had a brief episode of chest pain when taking a shower  Inpatient Medications    Scheduled Meds: . aspirin EC  81 mg Oral Daily  . atorvastatin  80 mg Oral QHS  . isosorbide mononitrate  30 mg Oral Daily  . metoprolol succinate  25 mg Oral Daily  . pantoprazole  40 mg Oral Daily  . sodium chloride flush  3 mL Intravenous Q12H  . sodium chloride flush  3 mL Intravenous Q12H   Continuous Infusions: . sodium chloride Stopped (11/20/20 0230)  . sodium chloride    . heparin 1,100 Units/hr (11/22/20 0548)   PRN Meds: sodium chloride, acetaminophen, diazepam, ondansetron (ZOFRAN) IV, sodium chloride flush   Vital Signs    Vitals:   11/21/20 1420 11/21/20 2026 11/22/20 0543 11/22/20 0806  BP: 114/79 (!) 152/95 (!) 159/95 (!) 154/9  Pulse: 65 71 (!) 53   Resp: 18 18 16 17   Temp: 97.6 F (36.4 C) 98.2 F (36.8 C) 97.8 F (36.6 C) 97.7 F (36.5 C)  TempSrc: Oral Oral Oral Oral  SpO2: 95% 96% 98% 95%  Weight:   129.4 kg   Height:        Intake/Output Summary (Last 24 hours) at 11/22/2020 0828 Last data filed at 11/21/2020 1503 Gross per 24 hour  Intake 679.63 ml  Output --  Net 679.63 ml   Last 3 Weights 11/22/2020 11/19/2020 11/17/2020  Weight (lbs) 285 lb 3.2 oz 285 lb 0.9 oz 283 lb 8.2 oz  Weight (kg) 129.366 kg 129.3 kg 128.6 kg      Telemetry    Sinus rhythm- Personally Reviewed  ECG    No new tracing  Physical Exam   Exam per MD  Cardiovascular exam unremarkable  Labs    High Sensitivity Troponin:   Recent Labs  Lab 11/16/20 2240 11/17/20 0618 11/17/20 1052 11/17/20 1445 11/17/20 1717  TROPONINIHS 8 63* 93* 106* 123*      Chemistry Recent Labs  Lab 11/17/20 0345 11/19/20 0225 11/20/20 0602  NA 140 137 140  K 3.8 3.6 3.7  CL 104 106 102  CO2 25 23 28   GLUCOSE 91 100* 94   BUN 12 11 9   CREATININE 0.78 0.78 0.80  CALCIUM 8.9 8.9 8.9  GFRNONAA >60 >60 >60  ANIONGAP 11 8 10      Hematology Recent Labs  Lab 11/20/20 0602 11/21/20 0417 11/22/20 0149  WBC 5.6 6.3 5.7  RBC 4.78 4.84 5.14  HGB 14.6 14.9 15.7  HCT 42.9 43.4 45.4  MCV 89.7 89.7 88.3  MCH 30.5 30.8 30.5  MCHC 34.0 34.3 34.6  RDW 12.1 12.2 12.1  PLT 175 165 166    BNP Recent Labs  Lab 11/17/20 0345  BNP 31.5     DDimer No results for input(s): DDIMER in the last 168 hours.   Radiology    VAS US DOPPLER PRE CABG  Result Date: 11/20/2020 PREOPERATIVE VASCULAR EVALUATION  Indications:      Pre-CABG. Risk Factors:     Hypertension, coronary artery disease. Comparison Study: No prior studies. Performing Technologist: Rogelia Rohrer RVT, RDMS Supporting Technologist: Darlin Coco RDMS  Examination Guidelines: A complete evaluation includes B-mode imaging, spectral Doppler, color Doppler, and power Doppler as needed of all accessible portions of each vessel. Bilateral testing is considered an integral part of a complete  examination. Limited examinations for reoccurring indications may be performed as noted.  Right Carotid Findings: +----------+--------+--------+--------+-----------------------+--------+           PSV cm/sEDV cm/sStenosisDescribe               Comments +----------+--------+--------+--------+-----------------------+--------+ CCA Prox  54      11                                              +----------+--------+--------+--------+-----------------------+--------+ CCA Distal56      14                                              +----------+--------+--------+--------+-----------------------+--------+ ICA Prox  43      17      1-39%   heterogenous and smooth         +----------+--------+--------+--------+-----------------------+--------+ ICA Distal64      25                                               +----------+--------+--------+--------+-----------------------+--------+ ECA       86      12                                              +----------+--------+--------+--------+-----------------------+--------+ Portions of this table do not appear on this page. +----------+--------+-------+----------------+------------+           PSV cm/sEDV cmsDescribe        Arm Pressure +----------+--------+-------+----------------+------------+ Subclavian102            Multiphasic, WNL             +----------+--------+-------+----------------+------------+ +---------+--------+--+--------+--+---------+ VertebralPSV cm/s33EDV cm/s11Antegrade +---------+--------+--+--------+--+---------+ Left Carotid Findings: +----------+--------+--------+--------+--------+--------+           PSV cm/sEDV cm/sStenosisDescribeComments +----------+--------+--------+--------+--------+--------+ CCA Prox  73      11                               +----------+--------+--------+--------+--------+--------+ CCA Distal57      9                                +----------+--------+--------+--------+--------+--------+ ICA Prox  49      19      1-39%   smooth           +----------+--------+--------+--------+--------+--------+ ICA Distal71      31                               +----------+--------+--------+--------+--------+--------+ ECA       75      10                               +----------+--------+--------+--------+--------+--------+ +----------+--------+--------+----------------+------------+ SubclavianPSV cm/sEDV cm/sDescribe        Arm Pressure +----------+--------+--------+----------------+------------+  119             Multiphasic, WNL             +----------+--------+--------+----------------+------------+ +---------+--------+--+--------+--+---------+ VertebralPSV cm/s31EDV cm/s10Antegrade +---------+--------+--+--------+--+---------+  ABI Findings:  +--------+------------------+-----+---------+--------+ Right   Rt Pressure (mmHg)IndexWaveform Comment  +--------+------------------+-----+---------+--------+ [id number]                    triphasic         +--------+------------------+-----+---------+--------+ PTA     154               1.17 triphasic         +--------+------------------+-----+---------+--------+ DP      143               1.08 triphasic         +--------+------------------+-----+---------+--------+ +--------+------------------+-----+---------+-------+ Left    Lt Pressure (mmHg)IndexWaveform Comment +--------+------------------+-----+---------+-------+ [id number]                    triphasic        +--------+------------------+-----+---------+-------+ PTA     162               1.23 triphasic        +--------+------------------+-----+---------+-------+ DP      132               1.00 triphasic        +--------+------------------+-----+---------+-------+ +-------+---------------+----------------+ ABI/TBIToday's ABI/TBIPrevious ABI/TBI +-------+---------------+----------------+ Right  1.17                            +-------+---------------+----------------+ Left   1.23                            +-------+---------------+----------------+  Right Doppler Findings: +--------+--------+-----+---------+--------+ Site    PressureIndexDoppler  Comments +--------+--------+-----+---------+--------+ [id number]          triphasic         +--------+--------+-----+---------+--------+ Radial               triphasic         +--------+--------+-----+---------+--------+ Ulnar                triphasic         +--------+--------+-----+---------+--------+  Left Doppler Findings: +--------+--------+-----+---------+--------+ Site    PressureIndexDoppler  Comments +--------+--------+-----+---------+--------+ [id number]          triphasic          +--------+--------+-----+---------+--------+ Radial               triphasic         +--------+--------+-----+---------+--------+ Ulnar                triphasic         +--------+--------+-----+---------+--------+  Summary: Right Carotid: Velocities in the right ICA are consistent with a 1-39% stenosis. Left Carotid: Velocities in the left ICA are consistent with a 1-39% stenosis. Right ABI: Resting right ankle-brachial index is within normal range. No evidence of significant right lower extremity arterial disease. Left ABI: Resting left ankle-brachial index is within normal range. No evidence of significant left lower extremity arterial disease. Right Upper Extremity: Doppler waveforms remain within normal limits with right radial compression. Doppler waveforms remain within normal limits with right ulnar compression. Left Upper Extremity: Doppler waveforms remain within normal limits with left radial compression. Doppler waveforms remain within normal limits with left ulnar compression.  Electronically signed by Curt Jews MD on 11/20/2020 at 2:00:12  PM.    Final     Cardiac Studies   Cath: 11/19/20   1st Mrg lesion is 40% stenosed.  Prox Cx to Mid Cx lesion is 80% stenosed.  Non-stenotic RPAV lesion was previously treated.  Ost RCA to Dist RCA lesion is 5% stenosed.  Dist LM to Ost LAD lesion is 80% stenosed.  Mid LAD lesion is 60% stenosed with 60% stenosed side branch in 1st Sept.  Previously placed 2nd Diag stent (unknown type) is widely patent.  Multivessel CAD with stents in the LAD, diagonal, circumflex, circumflex marginal, and RCA extending to the PLA takeoff.  The LAD has progressive 80% ostial stenosis which seems to occur immediately proximal to the proximal stent. There is diffuse 60% narrowing in the proximal and mid stented segment. The diagonal stent is patent. The distal portion of the mid LAD stent is patent.  Left circumflex stent extending from the ostium  to the mid AV groove circumflex with focal eccentric 80% restenosis in the mid circumflex immediately after the takeoff of the marginal vessel. There is 30 to 40% ostial narrowing in th marginal vessel which is stented ostially to proximally.  The RCA is diffusely stented from the ostium to the takeoff of the PLA vessel. There is mild luminal irregularity.  Preserved global LV contractility with EF estimate at 18 mmHg.  RECOMMENDATION: The angiographic findings were reviewed with Dr. Allyson Sabal in the catheterization laboratory. With the ostial progression of 80% in the LAD as well as again restenosis in the circumflex vessel recommend surgical consultation for optimal long-term benefit. We will hold prasugrel. Increase anti-ischemic medications, optimal lipid management and BP control. Diagnostic Dominance: Right   Echo: 11/19/20  IMPRESSIONS    1. Left ventricular ejection fraction, by estimation, is 55 to 60%. The  left ventricle has normal function. The left ventricle has no regional  wall motion abnormalities. There is mild concentric left ventricular  hypertrophy. Left ventricular diastolic  parameters are consistent with Grade I diastolic dysfunction (impaired  relaxation).  2. Right ventricular systolic function is normal. The right ventricular  size is normal.  3. The mitral valve is normal in structure. No evidence of mitral valve  regurgitation. No evidence of mitral stenosis.  4. The aortic valve is tricuspid. Aortic valve regurgitation is not  visualized. No aortic stenosis is present.  5. Aortic dilatation noted. There is mild dilatation at the level of the  sinuses of Valsalva, measuring 39 mm.  6. The inferior vena cava is normal in size with greater than 50%  respiratory variability, suggesting right atrial pressure of 3 mmHg.   Patient Profile     65 y.o. male with a hx of CADs/p multiplestents withrecently POBA in 10/21, s/ppacemaker, PAF, HLD,  HTNand strong family hx of CAD (multiple family member died in 26s after CABG) presented for Chest pain.  Assessment & Plan    1. Unstable angina: Mixed symptoms. His prior anginal symptoms were abdominal discomfort. hsTn peaked at 123. Underwent cardiac cath noted above with 80% ostial LAD stenosis and restenosis of the mLcx. Seen by Dr. Laneta Simmers with plans for CABG on 1/10. Given amount of stenting and the need to be off Effient, he will remain inpatient until CABG. -- remains on IV heparin, ASA, statin, BB.   2. HLD: 08/17/2020: Cholesterol, Total 96; HDL 32; LDL Chol Calc (NIH) 35; Triglycerides 174 -- Continue Lipitor 80mg  qd  3. HTN: BP is elevated. Will continue BB (unable to further titrate 2/2 bradycardia) and  add norvasc  4. PAF: Found tohave Afib ondevice check in 08/2020. CHADSVASCs score of 2. Will need future discussion regarding [place].  -- remains on IV heparin until CABG  5. S/p PPM: St Jude -- Followed by Dr. Caryl Comes   For questions or updates, please contact [facility] Please consult [email or web address] for contact info under        Signed, Reino Bellis, NP  11/22/2020, 8:28 AM    Agree with note by Reino Bellis NP-C  Brief episode of chest pain last night but otherwise asymptomatic.  Has been walking up and down the halls without limitation.  Continues on IV heparin.  Exam is benign.  Awaiting CABG on Monday with Dr. Cyndia Bent.  Amlodipine added because of hypertension.  Lorretta Harp, M.D., [place], [facility], [place] [address] [place], [place]  [id number]  [phone] 11/22/2020 9:26 AM

## 2020-11-22 NOTE — Progress Notes (Addendum)
ANTICOAGULATION CONSULT NOTE - Follow Up Consult  Pharmacy Consult for heparin Indication: chest pain/ACS  No Known Allergies  Patient Measurements: Height: 6\' 1"  (185.4 cm) Weight: 129.4 kg (285 lb 3.2 oz) IBW/kg (Calculated) : 79.9 Heparin Dosing Weight: 108.5 kg  Vital Signs: Temp: 97.8 F (36.6 C) (01/06 0543) Temp Source: Oral (01/06 0543) BP: 159/95 (01/06 0543) Pulse Rate: 53 (01/06 0543)  Labs: Recent Labs    11/20/20 0602 11/20/20 0957 11/21/20 0417 11/22/20 0149  HGB 14.6  --  14.9 15.7  HCT 42.9  --  43.4 45.4  PLT 175  --  165 166  HEPARINUNFRC  --  0.47 0.62 0.30  CREATININE 0.80  --   --   --     Estimated Creatinine Clearance: 131.5 mL/min (by C-G formula based on SCr of 0.8 mg/dL).   Assessment: 65 yo male with a history of CAD s/p 30 stents, paroxysmal atrial fibrillation, and HTN presents with unstable angina. PTA the patient is not on anticoagulation. The pt is s/p LHC with plans for surgical consultation, pharmacy to resume heparin 10h after sheath pull.  Patient's heparin level continues to be therapeutic at 0.30 on 1100 units/hr. CBC stable/WNL. Planning to continue heparin for CABG 1/10.    Goal of Therapy:  Heparin level 0.3-0.7 units/ml Monitor platelets by anticoagulation protocol: Yes   Plan:  Continue heparin at 1100 units/hr Daily heparin level, CBC Monitor s/s bleeding  Addendum: no issues with heparin gtt overnight per RN. Will increase heparin gtt slightly to target closer to heparin level of 0.5 - 0.7 given unstable angina with significant stent history. Increase heparin to 1150 units/hr. Daily HL/CBC.   77, PharmD PGY1 Acute Care Pharmacy Resident 11/22/2020 7:10 AM  Please check [email or web address] for unit specific pharmacy phone numbers.

## 2020-11-22 NOTE — Progress Notes (Signed)
PT CancellationDC Note  Patient Details Name: Ryan Clarke MRN: [id number] DOB: August 03, 1956   Cancelled Treatment:    Reason Eval/Treat Not Completed: Other (comment). Pt is currently independent. Order is written to start 1/11 after pt has CABG 1/10. If pt needs PT after CABG we will need an order written after surgery so will sign off at this time and will see pt if re-ordered after surgery.   Angelina Ok [facility] 11/22/2020, 8:36 AM Skip Mayer PT Acute Rehabilitation Services Pager [phone] Office [phone]

## 2020-11-22 NOTE — Progress Notes (Signed)
Family Medicine Teaching Service Daily Progress Note Intern Pager: [phone]  Patient name: Ryan Clarke Medical record number: [id number] Date of birth: May 26, 1956 Age: 65 y.o. Gender: male  Primary Care Provider: Littie Deeds, MD Consultants: Cardiology, TCTS Code Status: Full  Pt Overview and Major Events to Date:  11/17/2020- admitted for unstable angina 11/19/2020- Left Heart Cath  Assessment and Plan: Ryan Clarke a 64 y.o.maleadmitted for unstable angina and awaiting left heart cath.PMH is significant for CAD s/p29-30 stents, PAF,andHTN.  Unstable Angina  H/o extensive CAD and multiple stents Patient reports no current symptoms. Had LVH on 11/19/20 that showed 80% ostial LAD stenosis and restenosis of mCX. Echo with preserved LVEF.Cards following.  TCTS saw yesterday and has patient scheduled for CABG on 11/26/20.   - Cardiology & TCTS following, appreciate Recs - Continue to hold Prasugrel - Continue ASA, BB and statin - Continue Daily Imdur - Protonix 40mg   - Cardiac monitoring - VS per protocol - Heparin for DVT PPX as well as ACS tx  HTN Chronic and stable.Patient has been well controlled overall. BP slightly elevated at 159/95. Home medication is metoprolol 25 mg. -Continue homeToprol XL25mg  daily  PAF Currently normal rate and rhthym. Normal Sinus seen on EKG yesterday. Patient has been sinus brady during this admissionand no evidence of recurrent atrial fibrillation. Recommend patient follow up with cardiology as outpatient. - Continue Toprol XL25mg  daily  FEN/GI:Carb Modified Diet  Status is: Inpatient  Remains inpatient appropriate because:CABG on 1/10   Dispo:  Patient From: Home  Planned Disposition: Home  Expected discharge date: 11/29/2020  Medically stable for discharge: No     Subjective:  Patient indicates is feeling well this morning. Does not have any chest pain, pressure or tightness.    Objective: Temp:  [97.6 F (36.4 C)-98.2 F (36.8 C)] 97.7 F (36.5 C) (01/06 0806) Pulse Rate:  [53-71] 53 (01/06 0543) Resp:  [16-18] 17 (01/06 0806) BP: (114-159)/(9-95) 154/9 (01/06 0806) SpO2:  [95 %-98 %] 95 % (01/06 0806) Weight:  [129.4 kg] 129.4 kg (01/06 0543)  Physical Exam: Constitutional:      Appearance: He is well-developed.  HENT:     Head: Normocephalic and atraumatic.  Cardiovascular:     Rate and Rhythm: Normal rate and regular rhythm.  Pulmonary:     Effort: Pulmonary effort is normal.     Breath sounds: Normal breath sounds.  Neurological:     Mental Status: He is alert.   Laboratory: Recent Labs  Lab 11/20/20 0602 11/21/20 0417 11/22/20 0149  WBC 5.6 6.3 5.7  HGB 14.6 14.9 15.7  HCT 42.9 43.4 45.4  PLT 175 165 166   Recent Labs  Lab 11/17/20 0345 11/19/20 0225 11/20/20 0602  NA 140 137 140  K 3.8 3.6 3.7  CL 104 106 102  CO2 25 23 28   BUN 12 11 9   CREATININE 0.78 0.78 0.80  CALCIUM 8.9 8.9 8.9  GLUCOSE 91 100* 94     Imaging/Diagnostic Tests: No new imaging  01/18/21, MD 11/22/2020, 12:10 PM PGY-1, [facility] FPTS Intern pager: [phone], text pages welcome

## 2020-11-22 NOTE — Plan of Care (Signed)
  Problem: Clinical Measurements: Goal: Cardiovascular complication will be avoided Outcome: Progressing   Problem: Coping: Goal: Level of anxiety will decrease Outcome: Progressing   Problem: Nutrition: Goal: Adequate nutrition will be maintained Outcome: Completed/Met   Problem: Elimination: Goal: Will not experience complications related to urinary retention Outcome: Completed/Met   Problem: Pain Managment: Goal: General experience of comfort will improve Outcome: Completed/Met

## 2020-11-22 NOTE — Progress Notes (Signed)
OT Cancellation Note  Patient Details Name: Ryan Clarke MRN: [id number] DOB: Dec 04, 1955   Cancelled Treatment:    Reason Eval/Treat Not Completed: OT screened, no needs identified, will sign off. Pt is currently independent. Order is written to start 1/11 after pt has CABG 1/10. If pt needs OT after CABG we will need an order written after surgery so will sign off at this time and will see pt if re-ordered after surgery.   Margaretmary Eddy [facility] 11/22/2020, 8:53 AM

## 2020-11-22 NOTE — Progress Notes (Signed)
Pt is ambulating well, independent. Will walk with his wife. We discussed surgery, answered questions regarding recovery/exercise. Will sign off for now until after surgery as he will amb ind.  [id number] Ethelda Chick CES, ACSM 10:50 AM 11/22/2020

## 2020-11-23 DIAGNOSIS — I25110 Atherosclerotic heart disease of native coronary artery with unstable angina pectoris: Secondary | ICD-10-CM

## 2020-11-23 LAB — CBC
HCT: 46.8 % (ref 39.0–52.0)
Hemoglobin: 15.6 g/dL (ref 13.0–17.0)
MCH: 30.1 pg (ref 26.0–34.0)
MCHC: 33.3 g/dL (ref 30.0–36.0)
MCV: 90.2 fL (ref 80.0–100.0)
Platelets: 171 10*3/uL (ref 150–400)
RBC: 5.19 MIL/uL (ref 4.22–5.81)
RDW: 12 % (ref 11.5–15.5)
WBC: 6.9 10*3/uL (ref 4.0–10.5)
nRBC: 0 % (ref 0.0–0.2)

## 2020-11-23 LAB — HEPARIN LEVEL (UNFRACTIONATED): Heparin Unfractionated: 0.5 IU/mL (ref 0.30–0.70)

## 2020-11-23 MED ORDER — EPINEPHRINE HCL 5 MG/250ML IV SOLN IN NS
0.0000 ug/min | INTRAVENOUS | Status: DC
  Filled 2020-11-23: qty 250

## 2020-11-23 MED ORDER — NOREPINEPHRINE 4 MG/250ML-% IV SOLN
0.0000 ug/min | INTRAVENOUS | Status: DC
  Filled 2020-11-23: qty 250

## 2020-11-23 MED ORDER — VANCOMYCIN HCL 1500 MG/300ML IV SOLN
1500.0000 mg | INTRAVENOUS | Status: AC
  Administered 2020-11-26: 1500 mg via INTRAVENOUS
  Filled 2020-11-23: qty 300

## 2020-11-23 MED ORDER — NITROGLYCERIN IN D5W 200-5 MCG/ML-% IV SOLN
2.0000 ug/min | INTRAVENOUS | Status: AC
  Administered 2020-11-26: 5 ug/min via INTRAVENOUS
  Filled 2020-11-23: qty 250

## 2020-11-23 MED ORDER — TRANEXAMIC ACID (OHS) PUMP PRIME SOLUTION
2.0000 mg/kg | INTRAVENOUS | Status: DC
  Filled 2020-11-23: qty 2.59

## 2020-11-23 MED ORDER — DEXMEDETOMIDINE HCL IN NACL 400 MCG/100ML IV SOLN
0.1000 ug/kg/h | INTRAVENOUS | Status: DC
  Filled 2020-11-23: qty 100

## 2020-11-23 MED ORDER — MILRINONE LACTATE IN DEXTROSE 20-5 MG/100ML-% IV SOLN
0.3000 ug/kg/min | INTRAVENOUS | Status: DC
  Filled 2020-11-23: qty 100

## 2020-11-23 MED ORDER — SODIUM CHLORIDE 0.9 % IV SOLN
INTRAVENOUS | Status: DC
  Filled 2020-11-23: qty 30

## 2020-11-23 MED ORDER — MAGNESIUM SULFATE 50 % IJ SOLN
40.0000 meq | INTRAMUSCULAR | Status: DC
  Filled 2020-11-23: qty 9.85

## 2020-11-23 MED ORDER — DEXMEDETOMIDINE HCL IN NACL 400 MCG/100ML IV SOLN
0.1000 ug/kg/h | INTRAVENOUS | Status: AC
  Administered 2020-11-26: .5 ug/kg/h via INTRAVENOUS
  Filled 2020-11-23: qty 100

## 2020-11-23 MED ORDER — TRANEXAMIC ACID 1000 MG/10ML IV SOLN
1.5000 mg/kg/h | INTRAVENOUS | Status: AC
  Administered 2020-11-26 (×2): 1.5 mg/kg/h via INTRAVENOUS
  Filled 2020-11-23: qty 25

## 2020-11-23 MED ORDER — PHENYLEPHRINE HCL-NACL 20-0.9 MG/250ML-% IV SOLN
30.0000 ug/min | INTRAVENOUS | Status: AC
  Administered 2020-11-26: 40 ug/min via INTRAVENOUS
  Filled 2020-11-23: qty 250

## 2020-11-23 MED ORDER — INSULIN REGULAR(HUMAN) IN NACL 100-0.9 UT/100ML-% IV SOLN
INTRAVENOUS | Status: AC
  Administered 2020-11-26: .8 [IU]/h via INTRAVENOUS
  Filled 2020-11-23: qty 100

## 2020-11-23 MED ORDER — POTASSIUM CHLORIDE 2 MEQ/ML IV SOLN
80.0000 meq | INTRAVENOUS | Status: DC
  Filled 2020-11-23: qty 40

## 2020-11-23 MED ORDER — PLASMA-LYTE 148 IV SOLN
INTRAVENOUS | Status: DC
  Filled 2020-11-23: qty 2.5

## 2020-11-23 MED ORDER — SODIUM CHLORIDE 0.9 % IV SOLN
750.0000 mg | INTRAVENOUS | Status: AC
  Administered 2020-11-26: 750 mg via INTRAVENOUS
  Filled 2020-11-23: qty 750

## 2020-11-23 MED ORDER — NITROGLYCERIN IN D5W 200-5 MCG/ML-% IV SOLN
2.0000 ug/min | INTRAVENOUS | Status: DC
  Filled 2020-11-23: qty 250

## 2020-11-23 MED ORDER — INSULIN REGULAR(HUMAN) IN NACL 100-0.9 UT/100ML-% IV SOLN
INTRAVENOUS | Status: DC
  Filled 2020-11-23: qty 100

## 2020-11-23 MED ORDER — SODIUM CHLORIDE 0.9 % IV SOLN
1.5000 g | INTRAVENOUS | Status: AC
  Administered 2020-11-26: 1.5 g via INTRAVENOUS
  Filled 2020-11-23: qty 1.5

## 2020-11-23 MED ORDER — TRANEXAMIC ACID (OHS) BOLUS VIA INFUSION
15.0000 mg/kg | INTRAVENOUS | Status: AC
  Administered 2020-11-26: 1941 mg via INTRAVENOUS
  Filled 2020-11-23: qty 1941

## 2020-11-23 NOTE — Progress Notes (Addendum)
ANTICOAGULATION CONSULT NOTE - Follow Up Consult  Pharmacy Consult for heparin Indication: chest pain/ACS  No Known Allergies  Patient Measurements: Height: 6\' 1"  (185.4 cm) Weight: 129.4 kg (285 lb 3.2 oz) IBW/kg (Calculated) : 79.9 Heparin Dosing Weight: 108.5 kg  Vital Signs: Temp: 97.4 F (36.3 C) (01/07 0635) Temp Source: Oral (01/07 0635) BP: 152/82 (01/07 0635) Pulse Rate: 71 (01/07 0635)  Labs: Recent Labs    11/21/20 0417 11/22/20 0149 11/23/20 0420  HGB 14.9 15.7 15.6  HCT 43.4 45.4 46.8  PLT 165 166 171  HEPARINUNFRC 0.62 0.30 0.50    Estimated Creatinine Clearance: 131.5 mL/min (by C-G formula based on SCr of 0.8 mg/dL).   Assessment: 65 yo male with a history of CAD s/p 30 stents, paroxysmal atrial fibrillation, and HTN presents with unstable angina. PTA the patient is not on anticoagulation. The pt is s/p LHC with plans for surgical consultation, pharmacy to resume heparin 10h after sheath pull.  Patient's heparin level is therapeutic at 0.50 on 1150 units/hr after slight rate increase to target higher end of goal heparin level given significant number of stents and cardiac history. CBC stable/WNL. Planning to continue heparin for CABG 1/10.   Goal of Therapy:  Heparin level 0.3-0.7 units/ml Monitor platelets by anticoagulation protocol: Yes   Plan:  Continue heparin at 1150 units/hr Daily heparin level, CBC Monitor s/s bleeding  Romilda Garret, PharmD PGY1 Acute Care Pharmacy Resident 11/23/2020 7:15 AM  Please check [email or web address] for unit specific pharmacy phone numbers.

## 2020-11-23 NOTE — Progress Notes (Signed)
Family Medicine Teaching Service Daily Progress Note Intern Pager: [phone]  Patient name: Ryan Clarke Medical record number: [id number] Date of birth: Oct 29, 1956 Age: 65 y.o. Gender: male  Primary Care Provider: [name], MD Consultants: Cardiology, TCTS Code Status: Full  Pt Overview and Major Events to Date:  11/17/2020- admitted for unstable angina 11/19/2020- Left Heart Cath  Assessment and Plan: Ryan Clarke a 65 y.o.maleadmitted for unstable angina and awaiting left heart cath.PMH is significant for CAD s/p29-30 stents, PAF,andHTN.  Unstable Angina  H/o extensive CAD and multiple stents Patient denies chest pain or other symptoms at this time. Had LVH on 11/19/20 that showed 80% ostial LAD stenosis and restenosis of mCX. Echo with preserved LVEF.Cards following.  TCTS saw 1/5 and has patient scheduled for CABG on 11/26/20.   - Cardiology & TCTS following, appreciate Recs -Plan for CABG on 1/10 - Continue to hold Prasugrel - Continue ASA, BB and statin - Continue Daily Imdur - Protonix 40mg   - Cardiac monitoring - VS per protocol - Heparin for DVT PPX as well as ACS tx  HTN Chronic and stable.Patient has been well controlled overall. BP slightly elevated at 152/82. Home medication is metoprolol 25 mg. -Continue homeToprol XL25mg  daily  PAF Currently normal rate with regular rhythm.Patient has been sinus brady during this admissionand no evidence of recurrent atrial fibrillation. Recommend patient follow up with cardiology as outpatient. - Continue Toprol XL25mg  daily  FEN/GI:Carb Modified Diet CVE:[id number]  Status is: Inpatient  Remains inpatient appropriate because:CABG on 1/10   Dispo:  Patient From: Home  Planned Disposition: Home  Expected discharge date: 11/29/2020  Medically stable for discharge: No   Subjective:  Denies chest pain, no complaints this morning.  Objective: Temp:  [97.4 F (36.3 C)-98 F (36.7 C)] 97.4 F  (36.3 C) (01/07 0635) Pulse Rate:  [67-71] 71 (01/07 0635) Resp:  [17-18] 18 (01/07 0635) BP: (112-154)/(9-82) 152/82 (01/07 0635) SpO2:  [94 %-95 %] 94 % (01/07 5852)  Physical Exam: General: Alert and oriented in no apparent distress Heart: Regular rate and rhythm with no murmurs appreciated Lungs: CTA bilaterally, no wheezing Skin: Warm and dry Extremities: No lower extremity edema   Laboratory: Recent Labs  Lab 11/21/20 0417 11/22/20 0149 11/23/20 0420  WBC 6.3 5.7 6.9  HGB 14.9 15.7 15.6  HCT 43.4 45.4 46.8  PLT 165 166 171   Recent Labs  Lab 11/17/20 0345 11/19/20 0225 11/20/20 0602  NA 140 137 140  K 3.8 3.6 3.7  CL 104 106 102  CO2 25 23 28   BUN 12 11 9   CREATININE 0.78 0.78 0.80  CALCIUM 8.9 8.9 8.9  GLUCOSE 91 100* 94     Imaging/Diagnostic Tests: No new imaging  Lurline Del, DO 11/23/2020, 7:03 AM PGY-2, [place] Intern pager: [phone], text pages welcome

## 2020-11-23 NOTE — Progress Notes (Addendum)
Progress Note  Patient Name: Ryan Clarke Date of Encounter: 11/23/2020  [facility] Cardiologist: Buford Dresser, MD   Subjective   Feeling well this morning. Working on his computer.   Inpatient Medications    Scheduled Meds: . amLODipine  5 mg Oral Daily  . aspirin EC  81 mg Oral Daily  . atorvastatin  80 mg Oral QHS  . isosorbide mononitrate  30 mg Oral Daily  . metoprolol succinate  25 mg Oral Daily  . pantoprazole  40 mg Oral Daily  . sodium chloride flush  3 mL Intravenous Q12H  . sodium chloride flush  3 mL Intravenous Q12H   Continuous Infusions: . sodium chloride Stopped (11/20/20 0230)  . sodium chloride    . heparin 1,150 Units/hr (11/23/20 0303)   PRN Meds: sodium chloride, acetaminophen, diazepam, ondansetron (ZOFRAN) IV, sodium chloride flush   Vital Signs    Vitals:   11/22/20 0806 11/22/20 1250 11/22/20 2005 11/23/20 0635  BP: (!) 154/9 112/78 116/80 (!) 152/82  Pulse:   67 71  Resp: 17  18 18   Temp: 97.7 F (36.5 C)  98 F (36.7 C) (!) 97.4 F (36.3 C)  TempSrc: Oral  Oral Oral  SpO2: 95%  95% 94%  Weight:      Height:        Intake/Output Summary (Last 24 hours) at 11/23/2020 0902 Last data filed at 11/22/2020 1000 Gross per 24 hour  Intake 300 ml  Output --  Net 300 ml   Last 3 Weights 11/22/2020 11/19/2020 11/17/2020  Weight (lbs) 285 lb 3.2 oz 285 lb 0.9 oz 283 lb 8.2 oz  Weight (kg) 129.366 kg 129.3 kg 128.6 kg      Telemetry    SR->SB - Personally Reviewed  ECG    No new tracing.   Physical Exam   GEN: No acute distress.   Neck: No JVD Cardiac: RRR, no murmurs, rubs, or gallops.  Respiratory: Clear to auscultation bilaterally. GI: Soft, nontender, non-distended  MS: No edema; No deformity. Neuro:  Nonfocal  Psych: Normal affect   Labs    High Sensitivity Troponin:   Recent Labs  Lab 11/16/20 2240 11/17/20 0618 11/17/20 1052 11/17/20 1445 11/17/20 1717  TROPONINIHS 8 63* 93* 106* 123*       Chemistry Recent Labs  Lab 11/17/20 0345 11/19/20 0225 11/20/20 0602  NA 140 137 140  K 3.8 3.6 3.7  CL 104 106 102  CO2 25 23 28   GLUCOSE 91 100* 94  BUN 12 11 9   CREATININE 0.78 0.78 0.80  CALCIUM 8.9 8.9 8.9  GFRNONAA >60 >60 >60  ANIONGAP 11 8 10      Hematology Recent Labs  Lab 11/21/20 0417 11/22/20 0149 11/23/20 0420  WBC 6.3 5.7 6.9  RBC 4.84 5.14 5.19  HGB 14.9 15.7 15.6  HCT 43.4 45.4 46.8  MCV 89.7 88.3 90.2  MCH 30.8 30.5 30.1  MCHC 34.3 34.6 33.3  RDW 12.2 12.1 12.0  PLT 165 166 171    BNP Recent Labs  Lab 11/17/20 0345  BNP 31.5     DDimer No results for input(s): DDIMER in the last 168 hours.   Radiology    No results found.  Cardiac Studies   Cath: 11/19/20   1st Mrg lesion is 40% stenosed.  Prox Cx to Mid Cx lesion is 80% stenosed.  Non-stenotic RPAV lesion was previously treated.  Ost RCA to Dist RCA lesion is 5% stenosed.  Dist LM to [facility] LAD  lesion is 80% stenosed.  Mid LAD lesion is 60% stenosed with 60% stenosed side branch in 1st Sept.  Previously placed 2nd Diag stent (unknown type) is widely patent.  Multivessel CAD with stents in the LAD, diagonal, circumflex, circumflex marginal, and RCA extending to the PLA takeoff.  The LAD has progressive 80% ostial stenosis which seems to occur immediately proximal to the proximal stent. There is diffuse 60% narrowing in the proximal and mid stented segment. The diagonal stent is patent. The distal portion of the mid LAD stent is patent.  Left circumflex stent extending from the ostium to the mid AV groove circumflex with focal eccentric 80% restenosis in the mid circumflex immediately after the takeoff of the marginal vessel. There is 30 to 40% ostial narrowing in th marginal vessel which is stented ostially to proximally.  The RCA is diffusely stented from the ostium to the takeoff of the PLA vessel. There is mild luminal irregularity.  Preserved global LV  contractility with EF estimate at 18 mmHg.  RECOMMENDATION: The angiographic findings were reviewed with Dr. Gwenlyn Found in the catheterization laboratory. With the ostial progression of 80% in the LAD as well as again restenosis in the circumflex vessel recommend surgical consultation for optimal long-term benefit. We will hold prasugrel. Increase anti-ischemic medications, optimal lipid management and BP control. Diagnostic Dominance: Right   Echo: 11/19/20  IMPRESSIONS    1. Left ventricular ejection fraction, by estimation, is 55 to 60%. The  left ventricle has normal function. The left ventricle has no regional  wall motion abnormalities. There is mild concentric left ventricular  hypertrophy. Left ventricular diastolic  parameters are consistent with Grade I diastolic dysfunction (impaired  relaxation).  2. Right ventricular systolic function is normal. The right ventricular  size is normal.  3. The mitral valve is normal in structure. No evidence of mitral valve  regurgitation. No evidence of mitral stenosis.  4. The aortic valve is tricuspid. Aortic valve regurgitation is not  visualized. No aortic stenosis is present.  5. Aortic dilatation noted. There is mild dilatation at the level of the  sinuses of Valsalva, measuring 39 mm.  6. The inferior vena cava is normal in size with greater than 50%  respiratory variability, suggesting right atrial pressure of 3 mmHg.   Patient Profile     65 y.o. male  with a hx of CADs/p multiplestents withrecently POBA in 10/21, s/ppacemaker, PAF, HLD, HTNand strong family hx of CAD (multiple family member died in 55s after CABG) presented for Chest pain.  Assessment & Plan    1. Unstable angina:Mixed symptoms. His prior anginal symptoms were abdominal discomfort.hsTn peaked at 123. Underwent cardiac cath noted above with 80% ostial LAD stenosis and restenosis of the mLcx. Seen by Dr. Cyndia Bent with plans for CABG on 1/10. Given  amount of stenting and the need to be off Effient, he will remain inpatient until CABG. -- remains on IV heparin, ASA, statin, BB.  2. HLD:08/17/2020: Cholesterol, Total 96; HDL 32; LDL Chol Calc (NIH) 35; Triglycerides 174 --Continue Lipitor 80mg  qd  3. HTN:BP is elevated at times. Will continue BB (unable to further titrate 2/2 bradycardia) and norvasc  4. KCL:EXNTZ tohave Afib ondevice check in 08/2020. CHADSVASCs score of 2.Will need future discussion regarding [place].  -- remains on IV heparin until CABG  5. S/p PPM:St Jude --Followed by Dr. Caryl Comes   For questions or updates, please contact [facility] Please consult [email or web address] for contact info under  Signed, Reino Bellis, NP  11/23/2020, 9:02 AM   Agree with note by Reino Bellis NP-C  No chest pain, ambulating in the hallways without symptoms.  On IV heparin.  Cardiac stable.  Planned CABG on Monday with Dr. Cyndia Bent.  Blood pressure still mildly elevated on amlodipine but we will not readjust medications until after surgery.  Lorretta Harp, M.D., [place], [facility], [place] [address] [place], [place]  [address]  [phone] 11/23/2020 9:43 AM

## 2020-11-23 NOTE — Progress Notes (Signed)
Family Medicine Teaching Service Daily Progress Note Intern Pager: [phone]  Patient name: Ryan Clarke Medical record number: [id number] Date of birth: 06/02/1956 Age: 65 y.o. Gender: male  Primary Care Provider: [name], MD Consultants: Cardiology, TCTS   Code Status: Full Code  Pt Overview and Major Events to Date:  11/17/2020- admitted for unstable angina 11/19/2020- Left Heart Cath  Assessment and Plan: Ryan Clarke is a 65 y.o. male who presents with chest pain secondary to unstable angina awaiting CABG. PMHx significant for: CAD s/p multiple stents, s/p dual chamber St. Jude pacemaker, pAF, HTN.  Unstable angina History of extensive CAD with multiple stents (30). LHC this admission showed 80% ostial LAD stenosis and restenosis of mCX. TTE with preserved LVEF. Plan for CABG 1/10. Denies chest pain currently. - cardiology and TCTS following, appreciate involvement - plan for CABG 1/10 - holding prasugrel - continue ASA, BB, statin - continue scheduled isosorbide mononitrate daily - continue PPI - heparin gtt - cardiac monitoring  HTN Well-controlled. Amlodipine 5 mg added this admission. Home meds: metoprolol succinate 25 mg daily - continue metoprolol, amlodipine  pAF No episodes of atrial fibrillation during this admission so far. Currently normal rate and rhythm. Rate-controlled with beta blocker and on anticoagulation with heparin. CHADSVASc 2, will likely need oral anticoagulation in the future.  FEN/GI: carb-modified diet PPx: heparin gtt  Disposition: med-tele, likely home after recovering from CABG on 1/10  Subjective:  NAOE. Feels good overall. Denies chest pain, indigestion.  Objective: Temp:  [97.4 F (36.3 C)-98.4 F (36.9 C)] 98.4 F (36.9 C) (01/07 2039) Pulse Rate:  [67-71] 67 (01/07 2039) Resp:  [18] 18 (01/07 2039) BP: (114-152)/(73-82) 114/73 (01/07 2039) SpO2:  [94 %-96 %] 96 % (01/07 2039) Physical Exam: General: Obese male resting in  bed comfortably, NAD Cardiovascular: RRR, no murmurs Respiratory: CTAB Abdomen: soft, non-tender, +BS Extremities: WWP, no edema  Laboratory: Recent Labs  Lab 11/22/20 0149 11/23/20 0420 11/24/20 0224  WBC 5.7 6.9 6.7  HGB 15.7 15.6 15.7  HCT 45.4 46.8 48.7  PLT 166 171 167   Recent Labs  Lab 11/19/20 0225 11/20/20 0602  NA 137 140  K 3.6 3.7  CL 106 102  CO2 23 28  BUN 11 9  CREATININE 0.78 0.80  CALCIUM 8.9 8.9  GLUCOSE 100* 94     Imaging/Diagnostic Tests: No new imaging.  [name], MD 11/24/2020, 6:21 AM PGY-1, [place] Intern pager: [phone], text pages welcome

## 2020-11-23 NOTE — Progress Notes (Signed)
4 Days Post-Op Procedure(s) (LRB): LEFT HEART CATH AND CORONARY ANGIOGRAPHY (N/A) Subjective: Had brief substernal pressure yesterday but ambulated in halls without difficulty.  Objective: Vital signs in last 24 hours: Temp:  [97.4 F (36.3 C)-98 F (36.7 C)] 97.4 F (36.3 C) (01/07 0635) Pulse Rate:  [67-71] 71 (01/07 0635) Cardiac Rhythm: Normal sinus rhythm;Heart block;Bundle branch block (01/07 0827) Resp:  [18] 18 (01/07 0635) BP: (112-152)/(78-82) 152/82 (01/07 0635) SpO2:  [94 %-95 %] 94 % (01/07 0635)  Hemodynamic parameters for last 24 hours:    Intake/Output from previous day: 01/06 0701 - 01/07 0700 In: 300 [P.O.:300] Out: -  Intake/Output this shift: No intake/output data recorded.  General appearance: alert and cooperative Heart: RRR, no murmur Lungs: clear No peripheral edema.  Lab Results: Recent Labs    11/22/20 0149 11/23/20 0420  WBC 5.7 6.9  HGB 15.7 15.6  HCT 45.4 46.8  PLT 166 171   BMET: No results for input(s): NA, K, CL, CO2, GLUCOSE, BUN, CREATININE, CALCIUM in the last 72 hours.  PT/INR: No results for input(s): LABPROT, INR in the last 72 hours. ABG No results found for: PHART, HCO3, TCO2, ACIDBASEDEF, O2SAT CBG (last 3)  No results for input(s): GLUCAP in the last 72 hours.  Assessment/Plan:  Multivessel CAD with 80% ostial LAD stenosis. Waiting on washout of Effient and plan CABG on Monday. He has no further questions. Will put preop orders in for Monday. Plan to use bilateral IMA. Would only use left radial artery if right IMA not suitable.   LOS: 5 days    Gaye Pollack 11/23/2020

## 2020-11-24 LAB — SURGICAL PCR SCREEN
MRSA, PCR: NEGATIVE
Staphylococcus aureus: NEGATIVE

## 2020-11-24 LAB — URINALYSIS, ROUTINE W REFLEX MICROSCOPIC
Bilirubin Urine: NEGATIVE
Glucose, UA: NEGATIVE mg/dL
Hgb urine dipstick: NEGATIVE
Ketones, ur: NEGATIVE mg/dL
Leukocytes,Ua: NEGATIVE
Nitrite: NEGATIVE
Protein, ur: 30 mg/dL — AB
Specific Gravity, Urine: 1.02 (ref 1.005–1.030)
pH: 5 (ref 5.0–8.0)

## 2020-11-24 LAB — CBC
HCT: 48.7 % (ref 39.0–52.0)
Hemoglobin: 15.7 g/dL (ref 13.0–17.0)
MCH: 29.7 pg (ref 26.0–34.0)
MCHC: 32.2 g/dL (ref 30.0–36.0)
MCV: 92.2 fL (ref 80.0–100.0)
Platelets: 167 10*3/uL (ref 150–400)
RBC: 5.28 MIL/uL (ref 4.22–5.81)
RDW: 12.2 % (ref 11.5–15.5)
WBC: 6.7 10*3/uL (ref 4.0–10.5)
nRBC: 0 % (ref 0.0–0.2)

## 2020-11-24 LAB — HEPARIN LEVEL (UNFRACTIONATED): Heparin Unfractionated: 0.31 IU/mL (ref 0.30–0.70)

## 2020-11-24 MED ORDER — SALINE SPRAY 0.65 % NA SOLN
1.0000 | NASAL | Status: DC | PRN
  Filled 2020-11-24: qty 44

## 2020-11-24 NOTE — Progress Notes (Signed)
      [address]       [place] [address]             [phone]      Vitals:   11/23/20 0635 11/23/20 2039 11/24/20 0646 11/24/20 0849  BP: (!) 152/82 114/73 (!) 153/81 134/88  Pulse: 71 67 (!) 55 68  Temp: (!) 97.4 F (36.3 C) 98.4 F (36.9 C) 98 F (36.7 C)   Resp: 18 18 17    Height:      Weight:      SpO2: 94% 96% 99%   TempSrc: Oral Oral Oral   BMI (Calculated):        Feels fine, no CP/SOB On Heparin gtt No change in current plan from surgical perspective  John Giovanni, PA-C

## 2020-11-24 NOTE — Progress Notes (Signed)
ANTICOAGULATION CONSULT NOTE - Follow Up Consult  Pharmacy Consult for heparin Indication: chest pain/ACS  No Known Allergies  Patient Measurements: Height: 6\' 1"  (185.4 cm) Weight: 129.4 kg (285 lb 3.2 oz) IBW/kg (Calculated) : 79.9 Heparin Dosing Weight: 108.5 kg  Vital Signs: Temp: 98 F (36.7 C) (01/08 0646) Temp Source: Oral (01/08 0646) BP: 153/81 (01/08 0646) Pulse Rate: 55 (01/08 0646)  Labs: Recent Labs    11/22/20 0149 11/23/20 0420 11/24/20 0224  HGB 15.7 15.6 15.7  HCT 45.4 46.8 48.7  PLT 166 171 167  HEPARINUNFRC 0.30 0.50 0.31    Estimated Creatinine Clearance: 131.5 mL/min (by C-G formula based on SCr of 0.8 mg/dL).   Assessment: 65 yo male with a history of CAD s/p 30 stents, paroxysmal atrial fibrillation, and HTN presents with unstable angina. PTA the patient is not on anticoagulation. The pt is s/p LHC with plans for surgical consultation, pharmacy to resume heparin 10h after sheath pull.  Patient's heparin level is at low end of therapeutic range (0.31). Have been targeting higher end of goal heparin level (0.5-0.7) given significant number of stents and cardiac history. CBC stable/WNL. Planning to continue heparin for CABG 1/10. No issues with line and no bleeding per RN. Will slightly increase heparin rate again today.   Goal of Therapy:  Heparin level 0.3-0.7 units/ml Monitor platelets by anticoagulation protocol: Yes   Plan:  Increase heparin to 1200 units/hr Daily heparin level, CBC Monitor s/s bleeding  Rebbeca Paul, PharmD PGY1 Pharmacy Resident 11/24/2020 7:43 AM  Please check [email or web address] for unit-specific pharmacy phone numbers.

## 2020-11-25 ENCOUNTER — Inpatient Hospital Stay (HOSPITAL_COMMUNITY): Payer: Medicare HMO

## 2020-11-25 LAB — BLOOD GAS, ARTERIAL
Acid-Base Excess: 2.1 mmol/L — ABNORMAL HIGH (ref 0.0–2.0)
Bicarbonate: 26.1 mmol/L (ref 20.0–28.0)
Drawn by: 24686
FIO2: 21
O2 Saturation: 91.1 %
Patient temperature: 37
pCO2 arterial: 40.5 mmHg (ref 32.0–48.0)
pH, Arterial: 7.425 (ref 7.350–7.450)
pO2, Arterial: 58.6 mmHg — ABNORMAL LOW (ref 83.0–108.0)

## 2020-11-25 LAB — CBC
HCT: 46.3 % (ref 39.0–52.0)
Hemoglobin: 15.6 g/dL (ref 13.0–17.0)
MCH: 30.4 pg (ref 26.0–34.0)
MCHC: 33.7 g/dL (ref 30.0–36.0)
MCV: 90.1 fL (ref 80.0–100.0)
Platelets: 175 10*3/uL (ref 150–400)
RBC: 5.14 MIL/uL (ref 4.22–5.81)
RDW: 12.4 % (ref 11.5–15.5)
WBC: 6 10*3/uL (ref 4.0–10.5)
nRBC: 0 % (ref 0.0–0.2)

## 2020-11-25 LAB — COMPREHENSIVE METABOLIC PANEL
ALT: 88 U/L — ABNORMAL HIGH (ref 0–44)
AST: 54 U/L — ABNORMAL HIGH (ref 15–41)
Albumin: 4 g/dL (ref 3.5–5.0)
Alkaline Phosphatase: 64 U/L (ref 38–126)
Anion gap: 11 (ref 5–15)
BUN: 11 mg/dL (ref 8–23)
CO2: 27 mmol/L (ref 22–32)
Calcium: 9.5 mg/dL (ref 8.9–10.3)
Chloride: 103 mmol/L (ref 98–111)
Creatinine, Ser: 0.85 mg/dL (ref 0.61–1.24)
GFR, Estimated: >60 mL/min (ref >60)
Glucose, Bld: 106 mg/dL — ABNORMAL HIGH (ref 70–99)
Potassium: 3.8 mmol/L (ref 3.5–5.1)
Sodium: 141 mmol/L (ref 135–145)
Total Bilirubin: 2 mg/dL — ABNORMAL HIGH (ref 0.3–1.2)
Total Protein: 7.3 g/dL (ref 6.5–8.1)

## 2020-11-25 LAB — HEMOGLOBIN A1C
Hgb A1c MFr Bld: 5.2 % (ref 4.8–5.6)
Mean Plasma Glucose: 102.54 mg/dL

## 2020-11-25 LAB — PROTIME-INR
INR: 1.1 (ref 0.8–1.2)
Prothrombin Time: 13.3 seconds (ref 11.4–15.2)

## 2020-11-25 LAB — HEPARIN LEVEL (UNFRACTIONATED): Heparin Unfractionated: 0.44 IU/mL (ref 0.30–0.70)

## 2020-11-25 LAB — ABO/RH: ABO/RH(D): B POS

## 2020-11-25 LAB — TYPE AND SCREEN
ABO/RH(D): B POS
Antibody Screen: NEGATIVE

## 2020-11-25 LAB — APTT: aPTT: 73 seconds — ABNORMAL HIGH (ref 24–36)

## 2020-11-25 MED ORDER — CHLORHEXIDINE GLUCONATE CLOTH 2 % EX PADS
6.0000 | MEDICATED_PAD | Freq: Once | CUTANEOUS | Status: AC
  Administered 2020-11-26: 6 via TOPICAL

## 2020-11-25 MED ORDER — METOPROLOL TARTRATE 12.5 MG HALF TABLET
12.5000 mg | ORAL_TABLET | Freq: Once | ORAL | Status: AC
  Administered 2020-11-26: 12.5 mg via ORAL
  Filled 2020-11-25: qty 1

## 2020-11-25 MED ORDER — CHLORHEXIDINE GLUCONATE 0.12 % MT SOLN
15.0000 mL | Freq: Once | OROMUCOSAL | Status: AC
  Administered 2020-11-26: 15 mL via OROMUCOSAL
  Filled 2020-11-25: qty 15

## 2020-11-25 MED ORDER — NITROGLYCERIN 0.4 MG SL SUBL: 0.4000 mg | SUBLINGUAL_TABLET | SUBLINGUAL | Status: DC | PRN

## 2020-11-25 MED ORDER — DIAZEPAM 2 MG PO TABS
5.0000 mg | ORAL_TABLET | Freq: Once | ORAL | Status: AC
  Administered 2020-11-26: 5 mg via ORAL
  Filled 2020-11-25: qty 3

## 2020-11-25 MED ORDER — TEMAZEPAM 15 MG PO CAPS
15.0000 mg | ORAL_CAPSULE | Freq: Once | ORAL | Status: AC | PRN
  Administered 2020-11-25: 15 mg via ORAL
  Filled 2020-11-25: qty 1

## 2020-11-25 MED ORDER — BISACODYL 5 MG PO TBEC: 5.0000 mg | DELAYED_RELEASE_TABLET | Freq: Once | ORAL | Status: DC

## 2020-11-25 MED ORDER — NITROGLYCERIN 0.4 MG SL SUBL
SUBLINGUAL_TABLET | SUBLINGUAL | Status: AC
  Administered 2020-11-25: 0.4 mg via SUBLINGUAL
  Filled 2020-11-25: qty 1

## 2020-11-25 MED ORDER — CHLORHEXIDINE GLUCONATE CLOTH 2 % EX PADS
6.0000 | MEDICATED_PAD | Freq: Once | CUTANEOUS | Status: AC
  Administered 2020-11-25: 6 via TOPICAL

## 2020-11-25 NOTE — Progress Notes (Signed)
Patient had cardiac surgery book and doesn't want to watch videos. He has no further questions. Demonstrating proper use of IS at bedside

## 2020-11-25 NOTE — Progress Notes (Signed)
Family Medicine Teaching Service Daily Progress Note Intern Pager: [phone]  Patient name: Ryan Clarke Medical record number: [id number] Date of birth: 03-11-1956 Age: 65 y.o. Gender: male  Primary Care Provider: [name], MD Consultants: Cards, TCTS Code Status: Full  Pt Overview and Major Events to Date:  11/17/2020- admitted for unstable angina 11/19/2020- Left Heart Cath  Assessment and Plan: Ryan Clarke is a 65 y.o. male who presents with chest pain secondary to unstable angina awaiting CABG. PMHx significant for: CAD s/p multiple stents, s/p dual chamber St. Jude pacemaker, pAF, HTN   Unstable angina History of extensive CAD with multiple stents (30). LHC this admission showed 80% ostial LAD stenosis and restenosis of mCX. TTE with preserved LVEF. Plan for CABG 1/10. Had Anginal pain overnight when ambulating to bathroom.  Resolved with laying down and single dose nitro.  Repeat EKG unchanged. - cardiology and TCTS following, appreciate involvement. Heparin therapeutic level of 0.44.  Pharmacy increased to 12.5 to improve  Heparin level >0.5. - cardiology and TCTS following, appreciate involvement - plan for CABG 1/10 - Nursing to contact Cards if repeat Anginal episode - holding prasugrel - continue ASA, BB, statin - continue scheduled isosorbide mononitrate daily - continue PPI - heparin 12.5 gtt - cardiac monitoring - NPO at midnight for CABG tomorrow   HTN Normotensive today. Amlodipine 5 mg, Imdur 30 mg added this admission. Home meds: metoprolol succinate 25 mg daily - continue metoprolol, amlodipine, and imdur  PAF Currently normal rate with regular rhythm.Patient has been sinus brady during this admissionand no evidence of recurrent atrial fibrillation. Recommend patient follow up with cardiology as outpatient. - Continue Toprol XL25mg  daily  FEN/GI: Carb Modified, NPO after midninght PPx: Heparin gtt   Status is: Inpatient  Remains inpatient  appropriate because:Inpatient level of care appropriate due to severity of illness   Dispo:  Patient From: Home  Planned Disposition: Home  Expected discharge date: 11/29/2020  Medically stable for discharge: No     Subjective:  Patient indicates he had anginal episode when ambulating to bathroom this morning. Resolved with laing down and Nitro SL x1.  Has been doing fine otherwise.  Denies significant anxiety regarding procedure this morning.  Objective: Temp:  [97.7 F (36.5 C)-98.2 F (36.8 C)] 97.8 F (36.6 C) (01/09 0337) Pulse Rate:  [68-77] 77 (01/08 1120) Resp:  [16-18] 18 (01/09 0337) BP: (111-134)/(70-88) 124/74 (01/08 2206) SpO2:  [97 %-98 %] 98 % (01/09 0337) Weight:  [130.9 kg] 130.9 kg (01/09 0337)  Physical Exam: Physical Exam Constitutional:      General: He is not in acute distress.    Appearance: He is not ill-appearing.  HENT:     Head: Normocephalic.  Cardiovascular:     Rate and Rhythm: Normal rate and regular rhythm.     Heart sounds: Normal heart sounds.  Pulmonary:     Effort: Pulmonary effort is normal.     Breath sounds: Normal breath sounds.  Neurological:     Mental Status: He is alert.  Psychiatric:        Mood and Affect: Mood is not anxious.        Behavior: Behavior is not agitated.     Laboratory: Recent Labs  Lab 11/23/20 0420 11/24/20 0224 11/25/20 0214  WBC 6.9 6.7 6.0  HGB 15.6 15.7 15.6  HCT 46.8 48.7 46.3  PLT 171 167 175   Recent Labs  Lab 11/19/20 0225 11/20/20 0602  NA 137 140  K 3.6 3.7  CL  106 102  CO2 23 28  BUN 11 9  CREATININE 0.78 0.80  CALCIUM 8.9 8.9  GLUCOSE 100* 94    Heparin- 0.44  Imaging/Diagnostic Tests:   Delora Fuel, MD 11/25/2020, 8:13 AM PGY-1, [place] Intern pager: [phone], text pages welcome

## 2020-11-25 NOTE — Progress Notes (Signed)
ANTICOAGULATION CONSULT NOTE - Follow Up Consult  Pharmacy Consult for heparin Indication: chest pain/ACS  No Known Allergies  Patient Measurements: Height: 6\' 1"  (185.4 cm) Weight: 130.9 kg (288 lb 9.3 oz) IBW/kg (Calculated) : 79.9 Heparin Dosing Weight: 108.5 kg  Vital Signs: Temp: 97.8 F (36.6 C) (01/09 0337) Temp Source: Oral (01/09 0337) BP: 124/74 (01/08 2206)  Labs: Recent Labs    11/23/20 0420 11/24/20 0224 11/25/20 0214  HGB 15.6 15.7 15.6  HCT 46.8 48.7 46.3  PLT 171 167 175  HEPARINUNFRC 0.50 0.31 0.44    Estimated Creatinine Clearance: 132.3 mL/min (by C-G formula based on SCr of 0.8 mg/dL).   Assessment: 65 yo male with a history of CAD s/p 30 stents, paroxysmal atrial fibrillation, and HTN presents with unstable angina. PTA the patient is not on anticoagulation. The pt is s/p LHC with plans for surgical consultation, pharmacy to resume heparin 10h after sheath pull.  Heparin level is therapeutic this morning at 0.44 on 1200 units/hr. However, have been targeting higher end of goal heparin level (0.5-0.7) given significant number of stents and cardiac history. CBC stable/WNL. Planning to continue heparin for CABG 1/10. No issues with line and no bleeding per RN. Will slightly increase heparin rate again today.   Goal of Therapy:  Heparin level 0.3-0.7 units/ml Monitor platelets by anticoagulation protocol: Yes   Plan:  Increase heparin to 1250 units/hr Daily heparin level, CBC Monitor s/s bleeding  Rebbeca Paul, PharmD PGY1 Pharmacy Resident 11/25/2020 8:01 AM  Please check [email or web address] for unit-specific pharmacy phone numbers.

## 2020-11-25 NOTE — Progress Notes (Signed)
Patient c/o CP 4/10 after ambulating to bathroom no other symptoms present.  1 SL ntg given with complete relief.  Will continue to monitor closely.

## 2020-11-25 NOTE — Progress Notes (Signed)
Progress Note  Patient Name: Ryan Clarke Date of Encounter: 11/25/2020  [facility] Cardiologist: Buford Dresser, MD   Subjective   Denies CP at present   Mild spell earlier relieved with SL NTG   NO SOB   Inpatient Medications    Scheduled Meds: . amLODipine  5 mg Oral Daily  . aspirin EC  81 mg Oral Daily  . atorvastatin  80 mg Oral QHS  . [START ON 11/26/2020] epinephrine  0-10 mcg/min Intravenous To OR  . [START ON 11/26/2020] heparin-papaverine-plasmalyte irrigation   Irrigation To OR  . [START ON 11/26/2020] insulin   Intravenous To OR  . isosorbide mononitrate  30 mg Oral Daily  . [START ON 11/26/2020] magnesium sulfate  40 mEq Other To OR  . metoprolol succinate  25 mg Oral Daily  . pantoprazole  40 mg Oral Daily  . [START ON 11/26/2020] phenylephrine  30-200 mcg/min Intravenous To OR  . [START ON 11/26/2020] potassium chloride  80 mEq Other To OR  . sodium chloride flush  3 mL Intravenous Q12H  . sodium chloride flush  3 mL Intravenous Q12H  . [START ON 11/26/2020] tranexamic acid  15 mg/kg Intravenous To OR  . [START ON 11/26/2020] tranexamic acid  2 mg/kg Intracatheter To OR   Continuous Infusions: . sodium chloride    . [START ON 11/26/2020] cefUROXime (ZINACEF)  IV    . [START ON 11/26/2020] cefUROXime (ZINACEF)  IV    . [START ON 11/26/2020] dexmedetomidine    . [START ON 11/26/2020] heparin 30,000 units/NS 1000 mL solution for CELLSAVER    . heparin 1,250 Units/hr (11/25/20 0814)  . [START ON 11/26/2020] milrinone    . [START ON 11/26/2020] nitroGLYCERIN    . [START ON 11/26/2020] norepinephrine    . [START ON 11/26/2020] tranexamic acid (CYKLOKAPRON) infusion ([facility])    . [START ON 11/26/2020] vancomycin     PRN Meds: sodium chloride, acetaminophen, diazepam, nitroGLYCERIN, ondansetron (ZOFRAN) IV, sodium chloride, sodium chloride flush   Vital Signs    Vitals:   11/24/20 1120 11/24/20 2206 11/25/20 0337 11/25/20 0844  BP: 111/70 124/74  (!) 140/97   Pulse: 77     Resp: 18 16 18 18   Temp: 97.7 F (36.5 C) 98.2 F (36.8 C) 97.8 F (36.6 C) 97.8 F (36.6 C)  TempSrc: Oral Oral Oral   SpO2:  97% 98%   Weight:   130.9 kg   Height:        Intake/Output Summary (Last 24 hours) at 11/25/2020 1005 Last data filed at 11/25/2020 0500 Gross per 24 hour  Intake 480.1 ml  Output --  Net 480.1 ml   Last 3 Weights 11/25/2020 11/22/2020 11/19/2020  Weight (lbs) 288 lb 9.3 oz 285 lb 3.2 oz 285 lb 0.9 oz  Weight (kg) 130.9 kg 129.366 kg 129.3 kg      Telemetry    SR  - Personally Reviewed  ECG    No new tracing.   Physical Exam   GEN: No acute distress.   Neck: No JVD Cardiac: RRR, no murmurs, Respiratory: Clear to auscultation bilaterally. GI: Soft, nontender, non-distended  MS: No edema; No deformity. Neuro:  Nonfocal  Psych: Normal affect   Labs    High Sensitivity Troponin:   Recent Labs  Lab 11/16/20 2240 11/17/20 0618 11/17/20 1052 11/17/20 1445 11/17/20 1717  TROPONINIHS 8 63* 93* 106* 123*      Chemistry Recent Labs  Lab 11/19/20 0225 11/20/20 0602  NA 137 140  K 3.6 3.7  CL 106 102  CO2 23 28  GLUCOSE 100* 94  BUN 11 9  CREATININE 0.78 0.80  CALCIUM 8.9 8.9  GFRNONAA >60 >60  ANIONGAP 8 10     Hematology Recent Labs  Lab 11/23/20 0420 11/24/20 0224 11/25/20 0214  WBC 6.9 6.7 6.0  RBC 5.19 5.28 5.14  HGB 15.6 15.7 15.6  HCT 46.8 48.7 46.3  MCV 90.2 92.2 90.1  MCH 30.1 29.7 30.4  MCHC 33.3 32.2 33.7  RDW 12.0 12.2 12.4  PLT 171 167 175    BNP No results for input(s): BNP, PROBNP in the last 168 hours.   DDimer No results for input(s): DDIMER in the last 168 hours.   Radiology    No results found.  Cardiac Studies   Cath: 11/19/20   1st Mrg lesion is 40% stenosed.  Prox Cx to Mid Cx lesion is 80% stenosed.  Non-stenotic RPAV lesion was previously treated.  Ost RCA to Dist RCA lesion is 5% stenosed.  Dist LM to Ost LAD lesion is 80% stenosed.  Mid LAD lesion is 60%  stenosed with 60% stenosed side branch in 1st Sept.  Previously placed 2nd Diag stent (unknown type) is widely patent.  Multivessel CAD with stents in the LAD, diagonal, circumflex, circumflex marginal, and RCA extending to the PLA takeoff.  The LAD has progressive 80% ostial stenosis which seems to occur immediately proximal to the proximal stent. There is diffuse 60% narrowing in the proximal and mid stented segment. The diagonal stent is patent. The distal portion of the mid LAD stent is patent.  Left circumflex stent extending from the ostium to the mid AV groove circumflex with focal eccentric 80% restenosis in the mid circumflex immediately after the takeoff of the marginal vessel. There is 30 to 40% ostial narrowing in th marginal vessel which is stented ostially to proximally.  The RCA is diffusely stented from the ostium to the takeoff of the PLA vessel. There is mild luminal irregularity.  Preserved global LV contractility with EF estimate at 18 mmHg.  RECOMMENDATION: The angiographic findings were reviewed with Dr. Gwenlyn Found in the catheterization laboratory. With the ostial progression of 80% in the LAD as well as again restenosis in the circumflex vessel recommend surgical consultation for optimal long-term benefit. We will hold prasugrel. Increase anti-ischemic medications, optimal lipid management and BP control. Diagnostic Dominance: Right   Echo: 11/19/20  IMPRESSIONS    1. Left ventricular ejection fraction, by estimation, is 55 to 60%. The  left ventricle has normal function. The left ventricle has no regional  wall motion abnormalities. There is mild concentric left ventricular  hypertrophy. Left ventricular diastolic  parameters are consistent with Grade I diastolic dysfunction (impaired  relaxation).  2. Right ventricular systolic function is normal. The right ventricular  size is normal.  3. The mitral valve is normal in structure. No evidence of  mitral valve  regurgitation. No evidence of mitral stenosis.  4. The aortic valve is tricuspid. Aortic valve regurgitation is not  visualized. No aortic stenosis is present.  5. Aortic dilatation noted. There is mild dilatation at the level of the  sinuses of Valsalva, measuring 39 mm.  6. The inferior vena cava is normal in size with greater than 50%  respiratory variability, suggesting right atrial pressure of 3 mmHg.   Patient Profile     65 y.o. male  with a hx of CADs/p multiplestents withrecently POBA in 10/21, s/ppacemaker, PAF, HLD, HTNand strong family hx  of CAD (multiple family member died in 78s after CABG) presented for Chest pain.  Assessment & Plan    1. Unstable angina:Mixed symptoms. His prior anginal symptoms were abdominal discomfort.hsTn peaked at 123. Underwent cardiac cath noted above with 80% ostial LAD stenosis and restenosis of the mLcx. Seen by Dr. Cyndia Bent with plans for CABG on 1/10. Given amount of stenting and the need to be off Effient, he will remain inpatient until CABG. -- remains on IV heparin, ASA, statin, BB. Mild discomfort earlier today   Relieved with NTG  Follow   2. HLD:08/17/2020: Cholesterol, Total 96; HDL 32; LDL Chol Calc (NIH) 35; Triglycerides 174 --Continue Lipitor 80mg  qd  3. HTN:BP overall OK    4. IFO:YDXAJ tohave Afib ondevice check in 08/2020. CHADSVASCs score of 2.Will need future discussion regarding [place].  -- remains on IV heparin until CABG  5. S/p PPM:St Jude --Followed by Dr. Caryl Comes   For questions or updates, please contact [facility] Please consult [email or web address] for contact info under        Signed, Dorris Carnes, MD  11/25/2020, 10:05 AM

## 2020-11-26 ENCOUNTER — Inpatient Hospital Stay (HOSPITAL_COMMUNITY): Payer: Medicare HMO

## 2020-11-26 ENCOUNTER — Inpatient Hospital Stay (HOSPITAL_COMMUNITY): Payer: Medicare HMO | Admitting: Certified Registered Nurse Anesthetist

## 2020-11-26 ENCOUNTER — Inpatient Hospital Stay (HOSPITAL_COMMUNITY)
Admission: EM | Disposition: A | Discharge status: Home / Self Care | Payer: Self-pay | Source: Home / Self Care | Attending: Family Medicine

## 2020-11-26 DIAGNOSIS — I2510 Atherosclerotic heart disease of native coronary artery without angina pectoris: Secondary | ICD-10-CM

## 2020-11-26 DIAGNOSIS — Z951 Presence of aortocoronary bypass graft: Secondary | ICD-10-CM

## 2020-11-26 HISTORY — PX: TEE WITHOUT CARDIOVERSION: SHX5443

## 2020-11-26 HISTORY — PX: CORONARY ARTERY BYPASS GRAFT: SHX141

## 2020-11-26 LAB — POCT I-STAT 7, (LYTES, BLD GAS, ICA,H+H)
Acid-Base Excess: 3 mmol/L — ABNORMAL HIGH (ref 0.0–2.0)
Acid-Base Excess: 3 mmol/L — ABNORMAL HIGH (ref 0.0–2.0)
Acid-base deficit: 2 mmol/L (ref 0.0–2.0)
Acid-base deficit: 1 mmol/L (ref 0.0–2.0)
Bicarbonate: 28.7 mmol/L — ABNORMAL HIGH (ref 20.0–28.0)
Bicarbonate: 26.9 mmol/L (ref 20.0–28.0)
Bicarbonate: 24.9 mmol/L (ref 20.0–28.0)
Bicarbonate: 25.7 mmol/L (ref 20.0–28.0)
Calcium, Ion: 1.1 mmol/L — ABNORMAL LOW (ref 1.15–1.40)
Calcium, Ion: 1.15 mmol/L (ref 1.15–1.40)
Calcium, Ion: 1.21 mmol/L (ref 1.15–1.40)
Calcium, Ion: 1.21 mmol/L (ref 1.15–1.40)
HCT: 36 % — ABNORMAL LOW (ref 39.0–52.0)
HCT: 37 % — ABNORMAL LOW (ref 39.0–52.0)
HCT: 39 % (ref 39.0–52.0)
HCT: 38 % — ABNORMAL LOW (ref 39.0–52.0)
Hemoglobin: 12.2 g/dL — ABNORMAL LOW (ref 13.0–17.0)
Hemoglobin: 12.6 g/dL — ABNORMAL LOW (ref 13.0–17.0)
Hemoglobin: 13.3 g/dL (ref 13.0–17.0)
Hemoglobin: 12.9 g/dL — ABNORMAL LOW (ref 13.0–17.0)
O2 Saturation: 100 %
O2 Saturation: 100 %
O2 Saturation: 90 %
O2 Saturation: 94 %
Patient temperature: 35.1
Potassium: 4.2 mmol/L (ref 3.5–5.1)
Potassium: 4.2 mmol/L (ref 3.5–5.1)
Potassium: 3.7 mmol/L (ref 3.5–5.1)
Potassium: 3.9 mmol/L (ref 3.5–5.1)
Sodium: 141 mmol/L (ref 135–145)
Sodium: 140 mmol/L (ref 135–145)
Sodium: 144 mmol/L (ref 135–145)
Sodium: 141 mmol/L (ref 135–145)
TCO2: 30 mmol/L (ref 22–32)
TCO2: 28 mmol/L (ref 22–32)
TCO2: 26 mmol/L (ref 22–32)
TCO2: 27 mmol/L (ref 22–32)
pCO2 arterial: 48.3 mmHg — ABNORMAL HIGH (ref 32.0–48.0)
pCO2 arterial: 36.4 mmHg (ref 32.0–48.0)
pCO2 arterial: 49.4 mmHg — ABNORMAL HIGH (ref 32.0–48.0)
pCO2 arterial: 45.4 mmHg (ref 32.0–48.0)
pH, Arterial: 7.382 (ref 7.350–7.450)
pH, Arterial: 7.476 — ABNORMAL HIGH (ref 7.350–7.450)
pH, Arterial: 7.311 — ABNORMAL LOW (ref 7.350–7.450)
pH, Arterial: 7.351 (ref 7.350–7.450)
pO2, Arterial: 396 mmHg — ABNORMAL HIGH (ref 83.0–108.0)
pO2, Arterial: 286 mmHg — ABNORMAL HIGH (ref 83.0–108.0)
pO2, Arterial: 65 mmHg — ABNORMAL LOW (ref 83.0–108.0)
pO2, Arterial: 68 mmHg — ABNORMAL LOW (ref 83.0–108.0)

## 2020-11-26 LAB — ECHO INTRAOPERATIVE TEE
AV Mean grad: 5 mmHg
Height: 73 in
Single Plane A4C EF: 53.7 %
Weight: 4576 oz

## 2020-11-26 LAB — POCT I-STAT, CHEM 8
BUN: 13 mg/dL (ref 8–23)
BUN: 13 mg/dL (ref 8–23)
BUN: 11 mg/dL (ref 8–23)
BUN: 11 mg/dL (ref 8–23)
BUN: 10 mg/dL (ref 8–23)
BUN: 11 mg/dL (ref 8–23)
Calcium, Ion: 1.32 mmol/L (ref 1.15–1.40)
Calcium, Ion: 1.26 mmol/L (ref 1.15–1.40)
Calcium, Ion: 1.24 mmol/L (ref 1.15–1.40)
Calcium, Ion: 1.19 mmol/L (ref 1.15–1.40)
Calcium, Ion: 1.18 mmol/L (ref 1.15–1.40)
Calcium, Ion: 1.2 mmol/L (ref 1.15–1.40)
Chloride: 100 mmol/L (ref 98–111)
Chloride: 102 mmol/L (ref 98–111)
Chloride: 103 mmol/L (ref 98–111)
Chloride: 99 mmol/L (ref 98–111)
Chloride: 102 mmol/L (ref 98–111)
Chloride: 99 mmol/L (ref 98–111)
Creatinine, Ser: 0.7 mg/dL (ref 0.61–1.24)
Creatinine, Ser: 0.8 mg/dL (ref 0.61–1.24)
Creatinine, Ser: 0.6 mg/dL — ABNORMAL LOW (ref 0.61–1.24)
Creatinine, Ser: 0.7 mg/dL (ref 0.61–1.24)
Creatinine, Ser: 0.6 mg/dL — ABNORMAL LOW (ref 0.61–1.24)
Creatinine, Ser: 0.7 mg/dL (ref 0.61–1.24)
Glucose, Bld: 108 mg/dL — ABNORMAL HIGH (ref 70–99)
Glucose, Bld: 102 mg/dL — ABNORMAL HIGH (ref 70–99)
Glucose, Bld: 100 mg/dL — ABNORMAL HIGH (ref 70–99)
Glucose, Bld: 112 mg/dL — ABNORMAL HIGH (ref 70–99)
Glucose, Bld: 134 mg/dL — ABNORMAL HIGH (ref 70–99)
Glucose, Bld: 138 mg/dL — ABNORMAL HIGH (ref 70–99)
HCT: 46 % (ref 39.0–52.0)
HCT: 43 % (ref 39.0–52.0)
HCT: 42 % (ref 39.0–52.0)
HCT: 35 % — ABNORMAL LOW (ref 39.0–52.0)
HCT: 38 % — ABNORMAL LOW (ref 39.0–52.0)
HCT: 38 % — ABNORMAL LOW (ref 39.0–52.0)
Hemoglobin: 15.6 g/dL (ref 13.0–17.0)
Hemoglobin: 14.6 g/dL (ref 13.0–17.0)
Hemoglobin: 14.3 g/dL (ref 13.0–17.0)
Hemoglobin: 11.9 g/dL — ABNORMAL LOW (ref 13.0–17.0)
Hemoglobin: 12.9 g/dL — ABNORMAL LOW (ref 13.0–17.0)
Hemoglobin: 12.9 g/dL — ABNORMAL LOW (ref 13.0–17.0)
Potassium: 3.7 mmol/L (ref 3.5–5.1)
Potassium: 4.3 mmol/L (ref 3.5–5.1)
Potassium: 4 mmol/L (ref 3.5–5.1)
Potassium: 4.2 mmol/L (ref 3.5–5.1)
Potassium: 3.7 mmol/L (ref 3.5–5.1)
Potassium: 4.3 mmol/L (ref 3.5–5.1)
Sodium: 141 mmol/L (ref 135–145)
Sodium: 139 mmol/L (ref 135–145)
Sodium: 140 mmol/L (ref 135–145)
Sodium: 139 mmol/L (ref 135–145)
Sodium: 139 mmol/L (ref 135–145)
Sodium: 143 mmol/L (ref 135–145)
TCO2: 29 mmol/L (ref 22–32)
TCO2: 29 mmol/L (ref 22–32)
TCO2: 28 mmol/L (ref 22–32)
TCO2: 28 mmol/L (ref 22–32)
TCO2: 26 mmol/L (ref 22–32)
TCO2: 28 mmol/L (ref 22–32)

## 2020-11-26 LAB — CBC
HCT: 46.5 % (ref 39.0–52.0)
HCT: 40.7 % (ref 39.0–52.0)
HCT: 43.1 % (ref 39.0–52.0)
Hemoglobin: 15.9 g/dL (ref 13.0–17.0)
Hemoglobin: 13.3 g/dL (ref 13.0–17.0)
Hemoglobin: 14.4 g/dL (ref 13.0–17.0)
MCH: 30.8 pg (ref 26.0–34.0)
MCH: 29.7 pg (ref 26.0–34.0)
MCH: 29.7 pg (ref 26.0–34.0)
MCHC: 34.2 g/dL (ref 30.0–36.0)
MCHC: 32.7 g/dL (ref 30.0–36.0)
MCHC: 33.4 g/dL (ref 30.0–36.0)
MCV: 89.9 fL (ref 80.0–100.0)
MCV: 90.8 fL (ref 80.0–100.0)
MCV: 88.9 fL (ref 80.0–100.0)
Platelets: 180 10*3/uL (ref 150–400)
Platelets: 133 10*3/uL — ABNORMAL LOW (ref 150–400)
Platelets: 165 10*3/uL (ref 150–400)
RBC: 5.17 MIL/uL (ref 4.22–5.81)
RBC: 4.48 MIL/uL (ref 4.22–5.81)
RBC: 4.85 MIL/uL (ref 4.22–5.81)
RDW: 12.4 % (ref 11.5–15.5)
RDW: 12.1 % (ref 11.5–15.5)
RDW: 12.1 % (ref 11.5–15.5)
WBC: 7.1 10*3/uL (ref 4.0–10.5)
WBC: 12.8 10*3/uL — ABNORMAL HIGH (ref 4.0–10.5)
WBC: 13 10*3/uL — ABNORMAL HIGH (ref 4.0–10.5)
nRBC: 0 % (ref 0.0–0.2)
nRBC: 0 % (ref 0.0–0.2)
nRBC: 0 % (ref 0.0–0.2)

## 2020-11-26 LAB — BASIC METABOLIC PANEL
Anion gap: 12 (ref 5–15)
Anion gap: 9 (ref 5–15)
BUN: 13 mg/dL (ref 8–23)
BUN: 10 mg/dL (ref 8–23)
CO2: 25 mmol/L (ref 22–32)
CO2: 21 mmol/L — ABNORMAL LOW (ref 22–32)
Calcium: 9.1 mg/dL (ref 8.9–10.3)
Calcium: 8.3 mg/dL — ABNORMAL LOW (ref 8.9–10.3)
Chloride: 102 mmol/L (ref 98–111)
Chloride: 104 mmol/L (ref 98–111)
Creatinine, Ser: 0.86 mg/dL (ref 0.61–1.24)
Creatinine, Ser: 0.8 mg/dL (ref 0.61–1.24)
GFR, Estimated: >60 mL/min (ref >60)
GFR, Estimated: >60 mL/min (ref >60)
Glucose, Bld: 90 mg/dL (ref 70–99)
Glucose, Bld: 128 mg/dL — ABNORMAL HIGH (ref 70–99)
Potassium: 3.6 mmol/L (ref 3.5–5.1)
Potassium: 4.3 mmol/L (ref 3.5–5.1)
Sodium: 139 mmol/L (ref 135–145)
Sodium: 134 mmol/L — ABNORMAL LOW (ref 135–145)

## 2020-11-26 LAB — GLUCOSE, CAPILLARY
Glucose-Capillary: 130 mg/dL — ABNORMAL HIGH (ref 70–99)
Glucose-Capillary: 155 mg/dL — ABNORMAL HIGH (ref 70–99)
Glucose-Capillary: 145 mg/dL — ABNORMAL HIGH (ref 70–99)
Glucose-Capillary: 133 mg/dL — ABNORMAL HIGH (ref 70–99)
Glucose-Capillary: 128 mg/dL — ABNORMAL HIGH (ref 70–99)
Glucose-Capillary: 131 mg/dL — ABNORMAL HIGH (ref 70–99)
Glucose-Capillary: 151 mg/dL — ABNORMAL HIGH (ref 70–99)
Glucose-Capillary: 148 mg/dL — ABNORMAL HIGH (ref 70–99)
Glucose-Capillary: 131 mg/dL — ABNORMAL HIGH (ref 70–99)

## 2020-11-26 LAB — POCT I-STAT EG7
Acid-Base Excess: 2 mmol/L (ref 0.0–2.0)
Bicarbonate: 28.3 mmol/L — ABNORMAL HIGH (ref 20.0–28.0)
Calcium, Ion: 1.15 mmol/L (ref 1.15–1.40)
HCT: 38 % — ABNORMAL LOW (ref 39.0–52.0)
Hemoglobin: 12.9 g/dL — ABNORMAL LOW (ref 13.0–17.0)
O2 Saturation: 84 %
Potassium: 4.2 mmol/L (ref 3.5–5.1)
Sodium: 141 mmol/L (ref 135–145)
TCO2: 30 mmol/L (ref 22–32)
pCO2, Ven: 48.5 mmHg (ref 44.0–60.0)
pH, Ven: 7.373 (ref 7.250–7.430)
pO2, Ven: 50 mmHg — ABNORMAL HIGH (ref 32.0–45.0)

## 2020-11-26 LAB — HEMOGLOBIN AND HEMATOCRIT, BLOOD
HCT: 39.6 % (ref 39.0–52.0)
Hemoglobin: 13 g/dL (ref 13.0–17.0)

## 2020-11-26 LAB — PROTIME-INR
INR: 1.4 — ABNORMAL HIGH (ref 0.8–1.2)
Prothrombin Time: 16.2 seconds — ABNORMAL HIGH (ref 11.4–15.2)

## 2020-11-26 LAB — APTT: aPTT: 34 seconds (ref 24–36)

## 2020-11-26 LAB — HEPARIN LEVEL (UNFRACTIONATED): Heparin Unfractionated: 0.61 IU/mL (ref 0.30–0.70)

## 2020-11-26 LAB — PLATELET COUNT: Platelets: 189 10*3/uL (ref 150–400)

## 2020-11-26 SURGERY — CORONARY ARTERY BYPASS GRAFTING (CABG)
Anesthesia: General | Site: Chest

## 2020-11-26 MED ORDER — ASPIRIN 81 MG PO CHEW: 324.0000 mg | CHEWABLE_TABLET | Freq: Every day | ORAL | Status: DC

## 2020-11-26 MED ORDER — AMIODARONE HCL IN DEXTROSE 360-4.14 MG/200ML-% IV SOLN
30.0000 mg/h | INTRAVENOUS | Status: DC
  Administered 2020-11-27 – 2020-11-28 (×3): 30 mg/h via INTRAVENOUS
  Filled 2020-11-26 (×3): qty 200

## 2020-11-26 MED ORDER — SODIUM CHLORIDE 0.45 % IV SOLN: INTRAVENOUS | Status: DC | PRN

## 2020-11-26 MED ORDER — ROCURONIUM BROMIDE 10 MG/ML (PF) SYRINGE
PREFILLED_SYRINGE | INTRAVENOUS | Status: AC
  Filled 2020-11-26: qty 10

## 2020-11-26 MED ORDER — ACETAMINOPHEN 650 MG RE SUPP
650.0000 mg | Freq: Once | RECTAL | Status: AC
  Administered 2020-11-26: 650 mg via RECTAL

## 2020-11-26 MED ORDER — PROPOFOL 10 MG/ML IV BOLUS
INTRAVENOUS | Status: AC
  Filled 2020-11-26: qty 20

## 2020-11-26 MED ORDER — ACETAMINOPHEN 160 MG/5ML PO SOLN: 650.0000 mg | Freq: Once | ORAL | Status: AC

## 2020-11-26 MED ORDER — MIDAZOLAM HCL (PF) 10 MG/2ML IJ SOLN
INTRAMUSCULAR | Status: AC
  Filled 2020-11-26: qty 2

## 2020-11-26 MED ORDER — AMIODARONE LOAD VIA INFUSION
150.0000 mg | Freq: Once | INTRAVENOUS | Status: AC
  Administered 2020-11-26: 150 mg via INTRAVENOUS
  Filled 2020-11-26: qty 83.34

## 2020-11-26 MED ORDER — PHENYLEPHRINE 40 MCG/ML (10ML) SYRINGE FOR IV PUSH (FOR BLOOD PRESSURE SUPPORT)
PREFILLED_SYRINGE | INTRAVENOUS | Status: AC
  Filled 2020-11-26: qty 10

## 2020-11-26 MED ORDER — THROMBIN (RECOMBINANT) 20000 UNITS EX SOLR
CUTANEOUS | Status: AC
  Filled 2020-11-26: qty 20000

## 2020-11-26 MED ORDER — LACTATED RINGERS IV SOLN: INTRAVENOUS | Status: DC | PRN

## 2020-11-26 MED ORDER — SODIUM CHLORIDE 0.9 % IV SOLN: 250.0000 mL | INTRAVENOUS | Status: DC

## 2020-11-26 MED ORDER — EPHEDRINE 5 MG/ML INJ
INTRAVENOUS | Status: AC
  Filled 2020-11-26: qty 10

## 2020-11-26 MED ORDER — PROPOFOL 10 MG/ML IV BOLUS
INTRAVENOUS | Status: DC | PRN
  Administered 2020-11-26: 150 mg via INTRAVENOUS
  Administered 2020-11-26 (×2): 30 mg via INTRAVENOUS
  Administered 2020-11-26 (×3): 50 mg via INTRAVENOUS

## 2020-11-26 MED ORDER — TRANEXAMIC ACID 1000 MG/10ML IV SOLN
1.5000 mg/kg/h | INTRAVENOUS | Status: DC
  Filled 2020-11-26: qty 25

## 2020-11-26 MED ORDER — DEXMEDETOMIDINE HCL IN NACL 400 MCG/100ML IV SOLN
0.0000 ug/kg/h | INTRAVENOUS | Status: DC
  Administered 2020-11-26 – 2020-11-27 (×4): 0.7 ug/kg/h via INTRAVENOUS
  Administered 2020-11-27: 0.5 ug/kg/h via INTRAVENOUS
  Filled 2020-11-26 (×4): qty 100

## 2020-11-26 MED ORDER — METOPROLOL TARTRATE 25 MG/10 ML ORAL SUSPENSION: 12.5000 mg | Freq: Two times a day (BID) | ORAL | Status: DC

## 2020-11-26 MED ORDER — LIDOCAINE HCL (CARDIAC) PF 100 MG/5ML IV SOSY
100.0000 mg | PREFILLED_SYRINGE | Freq: Once | INTRAVENOUS | Status: AC
  Administered 2020-11-26: 100 mg via INTRAVENOUS

## 2020-11-26 MED ORDER — INSULIN REGULAR(HUMAN) IN NACL 100-0.9 UT/100ML-% IV SOLN: INTRAVENOUS | Status: DC

## 2020-11-26 MED ORDER — HEPARIN SODIUM (PORCINE) 1000 UNIT/ML IJ SOLN
INTRAMUSCULAR | Status: AC
  Filled 2020-11-26: qty 1

## 2020-11-26 MED ORDER — 0.9 % SODIUM CHLORIDE (POUR BTL) OPTIME
TOPICAL | Status: DC | PRN
  Administered 2020-11-26: 5000 mL

## 2020-11-26 MED ORDER — BISACODYL 5 MG PO TBEC
10.0000 mg | DELAYED_RELEASE_TABLET | Freq: Every day | ORAL | Status: DC
  Administered 2020-11-28: 10 mg via ORAL
  Filled 2020-11-26 (×2): qty 2

## 2020-11-26 MED ORDER — PROTAMINE SULFATE 10 MG/ML IV SOLN
INTRAVENOUS | Status: AC
  Filled 2020-11-26: qty 25

## 2020-11-26 MED ORDER — AMIODARONE HCL IN DEXTROSE 360-4.14 MG/200ML-% IV SOLN
60.0000 mg/h | INTRAVENOUS | Status: AC
  Administered 2020-11-26: 60 mg/h via INTRAVENOUS

## 2020-11-26 MED ORDER — MIDAZOLAM HCL 5 MG/5ML IJ SOLN
INTRAMUSCULAR | Status: DC | PRN
  Administered 2020-11-26: 1 mg via INTRAVENOUS
  Administered 2020-11-26: 2 mg via INTRAVENOUS
  Administered 2020-11-26 (×2): 1 mg via INTRAVENOUS

## 2020-11-26 MED ORDER — THROMBIN 20000 UNITS EX SOLR
CUTANEOUS | Status: DC | PRN
  Administered 2020-11-26: 20000 [IU] via TOPICAL

## 2020-11-26 MED ORDER — MIDAZOLAM HCL 2 MG/2ML IJ SOLN
2.0000 mg | INTRAMUSCULAR | Status: DC | PRN
  Administered 2020-11-26 – 2020-11-27 (×3): 2 mg via INTRAVENOUS
  Filled 2020-11-26 (×3): qty 2

## 2020-11-26 MED ORDER — BISACODYL 10 MG RE SUPP: 10.0000 mg | Freq: Every day | RECTAL | Status: DC

## 2020-11-26 MED ORDER — CHLORHEXIDINE GLUCONATE 0.12 % MT SOLN
15.0000 mL | OROMUCOSAL | Status: AC
  Administered 2020-11-26: 15 mL via OROMUCOSAL

## 2020-11-26 MED ORDER — OXYCODONE HCL 5 MG PO TABS
5.0000 mg | ORAL_TABLET | ORAL | Status: DC | PRN
Start: 2020-11-26 — End: 2020-11-28

## 2020-11-26 MED ORDER — DOCUSATE SODIUM 100 MG PO CAPS
200.0000 mg | ORAL_CAPSULE | Freq: Every day | ORAL | Status: DC
  Administered 2020-11-28: 200 mg via ORAL
  Filled 2020-11-26 (×2): qty 2

## 2020-11-26 MED ORDER — FENTANYL CITRATE (PF) 250 MCG/5ML IJ SOLN
INTRAMUSCULAR | Status: AC
  Filled 2020-11-26: qty 25

## 2020-11-26 MED ORDER — HEPARIN SODIUM (PORCINE) 1000 UNIT/ML IJ SOLN
INTRAMUSCULAR | Status: DC | PRN
  Administered 2020-11-26: 38000 [IU] via INTRAVENOUS

## 2020-11-26 MED ORDER — SODIUM CHLORIDE 0.9% FLUSH
3.0000 mL | Freq: Two times a day (BID) | INTRAVENOUS | Status: DC
  Administered 2020-11-27 – 2020-11-28 (×2): 3 mL via INTRAVENOUS

## 2020-11-26 MED ORDER — PROTAMINE SULFATE 10 MG/ML IV SOLN
INTRAVENOUS | Status: DC | PRN
  Administered 2020-11-26: 350 mg via INTRAVENOUS

## 2020-11-26 MED ORDER — LACTATED RINGERS IV SOLN
INTRAVENOUS | Status: DC
  Administered 2020-11-26: 20 mL/h via INTRAVENOUS

## 2020-11-26 MED ORDER — MAGNESIUM SULFATE 4 GM/100ML IV SOLN
4.0000 g | Freq: Once | INTRAVENOUS | Status: AC
  Administered 2020-11-26: 4 g via INTRAVENOUS
  Filled 2020-11-26: qty 100

## 2020-11-26 MED ORDER — EPHEDRINE SULFATE-NACL 50-0.9 MG/10ML-% IV SOSY
PREFILLED_SYRINGE | INTRAVENOUS | Status: DC | PRN
  Administered 2020-11-26 (×2): 5 mg via INTRAVENOUS

## 2020-11-26 MED ORDER — PROTAMINE SULFATE 10 MG/ML IV SOLN
INTRAVENOUS | Status: AC
  Filled 2020-11-26: qty 10

## 2020-11-26 MED ORDER — SODIUM CHLORIDE 0.9 % IV SOLN: INTRAVENOUS | Status: DC

## 2020-11-26 MED ORDER — PHENYLEPHRINE HCL-NACL 20-0.9 MG/250ML-% IV SOLN
0.0000 ug/min | INTRAVENOUS | Status: DC
  Administered 2020-11-26: 20 ug/min via INTRAVENOUS
  Filled 2020-11-26: qty 250

## 2020-11-26 MED ORDER — NITROGLYCERIN IN D5W 200-5 MCG/ML-% IV SOLN: 0.0000 ug/min | INTRAVENOUS | Status: DC

## 2020-11-26 MED ORDER — POTASSIUM CHLORIDE 10 MEQ/50ML IV SOLN
10.0000 meq | INTRAVENOUS | Status: AC
  Administered 2020-11-26 (×3): 10 meq via INTRAVENOUS

## 2020-11-26 MED ORDER — DEXTROSE 50 % IV SOLN: 0.0000 mL | INTRAVENOUS | Status: DC | PRN

## 2020-11-26 MED ORDER — ALBUMIN HUMAN 5 % IV SOLN
250.0000 mL | INTRAVENOUS | Status: DC | PRN
  Administered 2020-11-26 (×2): 12.5 g via INTRAVENOUS
  Filled 2020-11-26: qty 250

## 2020-11-26 MED ORDER — TRAMADOL HCL 50 MG PO TABS
50.0000 mg | ORAL_TABLET | ORAL | Status: DC | PRN
  Administered 2020-11-28: 50 mg via ORAL
  Filled 2020-11-26: qty 1

## 2020-11-26 MED ORDER — ALBUMIN HUMAN 5 % IV SOLN: INTRAVENOUS | Status: DC | PRN

## 2020-11-26 MED ORDER — LACTATED RINGERS IV SOLN: INTRAVENOUS | Status: DC

## 2020-11-26 MED ORDER — ASPIRIN EC 325 MG PO TBEC
325.0000 mg | DELAYED_RELEASE_TABLET | Freq: Every day | ORAL | Status: DC
  Administered 2020-11-27 – 2020-11-28 (×2): 325 mg via ORAL
  Filled 2020-11-26 (×2): qty 1

## 2020-11-26 MED ORDER — METOPROLOL TARTRATE 12.5 MG HALF TABLET: 12.5000 mg | ORAL_TABLET | Freq: Two times a day (BID) | ORAL | Status: DC

## 2020-11-26 MED ORDER — SODIUM CHLORIDE (PF) 0.9 % IJ SOLN
INTRAMUSCULAR | Status: AC
  Filled 2020-11-26: qty 10

## 2020-11-26 MED ORDER — VANCOMYCIN HCL IN DEXTROSE 1-5 GM/200ML-% IV SOLN
1000.0000 mg | Freq: Once | INTRAVENOUS | Status: AC
  Administered 2020-11-26: 1000 mg via INTRAVENOUS
  Filled 2020-11-26: qty 200

## 2020-11-26 MED ORDER — SODIUM CHLORIDE 0.9% FLUSH: 3.0000 mL | INTRAVENOUS | Status: DC | PRN

## 2020-11-26 MED ORDER — ONDANSETRON HCL 4 MG/2ML IJ SOLN: 4.0000 mg | Freq: Four times a day (QID) | INTRAMUSCULAR | Status: DC | PRN

## 2020-11-26 MED ORDER — ACETAMINOPHEN 500 MG PO TABS
1000.0000 mg | ORAL_TABLET | Freq: Four times a day (QID) | ORAL | Status: DC
  Administered 2020-11-27: 1000 mg via ORAL
  Filled 2020-11-26: qty 2

## 2020-11-26 MED ORDER — CHLORHEXIDINE GLUCONATE CLOTH 2 % EX PADS
6.0000 | MEDICATED_PAD | Freq: Every day | CUTANEOUS | Status: DC
  Administered 2020-11-27 – 2020-11-28 (×2): 6 via TOPICAL

## 2020-11-26 MED ORDER — ROCURONIUM BROMIDE 10 MG/ML (PF) SYRINGE
PREFILLED_SYRINGE | INTRAVENOUS | Status: DC | PRN
  Administered 2020-11-26: 50 mg via INTRAVENOUS
  Administered 2020-11-26: 20 mg via INTRAVENOUS
  Administered 2020-11-26: 80 mg via INTRAVENOUS
  Administered 2020-11-26 (×4): 50 mg via INTRAVENOUS

## 2020-11-26 MED ORDER — PHENYLEPHRINE 40 MCG/ML (10ML) SYRINGE FOR IV PUSH (FOR BLOOD PRESSURE SUPPORT)
PREFILLED_SYRINGE | INTRAVENOUS | Status: DC | PRN
  Administered 2020-11-26: 80 ug via INTRAVENOUS

## 2020-11-26 MED ORDER — METOPROLOL TARTRATE 5 MG/5ML IV SOLN: 2.5000 mg | INTRAVENOUS | Status: DC | PRN

## 2020-11-26 MED ORDER — ACETAMINOPHEN 160 MG/5ML PO SOLN
1000.0000 mg | Freq: Four times a day (QID) | ORAL | Status: DC
  Administered 2020-11-27: 1000 mg
  Filled 2020-11-26: qty 40.6

## 2020-11-26 MED ORDER — MORPHINE SULFATE (PF) 2 MG/ML IV SOLN
1.0000 mg | INTRAVENOUS | Status: DC | PRN
  Administered 2020-11-27 (×2): 2 mg via INTRAVENOUS
  Filled 2020-11-26: qty 1
  Filled 2020-11-26: qty 2

## 2020-11-26 MED ORDER — PANTOPRAZOLE SODIUM 40 MG PO TBEC
40.0000 mg | DELAYED_RELEASE_TABLET | Freq: Every day | ORAL | Status: DC
  Administered 2020-11-28: 40 mg via ORAL
  Filled 2020-11-26: qty 1

## 2020-11-26 MED ORDER — FENTANYL CITRATE (PF) 250 MCG/5ML IJ SOLN
INTRAMUSCULAR | Status: DC | PRN
  Administered 2020-11-26 (×3): 150 ug via INTRAVENOUS
  Administered 2020-11-26 (×2): 50 ug via INTRAVENOUS
  Administered 2020-11-26 (×2): 100 ug via INTRAVENOUS
  Administered 2020-11-26 (×2): 50 ug via INTRAVENOUS
  Administered 2020-11-26 (×2): 100 ug via INTRAVENOUS

## 2020-11-26 MED ORDER — LIDOCAINE 2% (20 MG/ML) 5 ML SYRINGE
INTRAMUSCULAR | Status: AC
  Filled 2020-11-26: qty 5

## 2020-11-26 MED ORDER — THROMBIN 20000 UNITS EX SOLR
OROMUCOSAL | Status: DC | PRN
  Administered 2020-11-26 (×3): 4 mL via TOPICAL

## 2020-11-26 MED ORDER — LACTATED RINGERS IV SOLN: 500.0000 mL | Freq: Once | INTRAVENOUS | Status: DC | PRN

## 2020-11-26 MED ORDER — HEMOSTATIC AGENTS (NO CHARGE) OPTIME
TOPICAL | Status: DC | PRN
  Administered 2020-11-26: 1 via TOPICAL

## 2020-11-26 MED ORDER — PLASMA-LYTE 148 IV SOLN
INTRAVENOUS | Status: DC | PRN
  Administered 2020-11-26: 500 mL

## 2020-11-26 MED ORDER — CEFUROXIME SODIUM 1.5 G IV SOLR
1.5000 g | Freq: Two times a day (BID) | INTRAVENOUS | Status: AC
  Administered 2020-11-26 – 2020-11-28 (×4): 1.5 g via INTRAVENOUS
  Filled 2020-11-26 (×4): qty 1.5

## 2020-11-26 MED ORDER — FAMOTIDINE IN NACL 20-0.9 MG/50ML-% IV SOLN
20.0000 mg | Freq: Two times a day (BID) | INTRAVENOUS | Status: AC
  Administered 2020-11-26: 20 mg via INTRAVENOUS
  Filled 2020-11-26 (×3): qty 50

## 2020-11-26 MED ORDER — AMIODARONE HCL IN DEXTROSE 360-4.14 MG/200ML-% IV SOLN
INTRAVENOUS | Status: AC
  Filled 2020-11-26: qty 400

## 2020-11-26 MED ORDER — LIDOCAINE HCL (PF) 2 % IJ SOLN
5.0000 mL | Freq: Once | INTRAMUSCULAR | Status: AC
  Filled 2020-11-26: qty 5

## 2020-11-26 SURGICAL SUPPLY — 123 items
BAG DECANTER FOR FLEXI CONT (MISCELLANEOUS) ×4 IMPLANT
BLADE CLIPPER SURG (BLADE) ×4 IMPLANT
BLADE STERNUM SYSTEM 6 (BLADE) ×4 IMPLANT
BLADE SURG 15 STRL LF DISP TIS (BLADE) ×3 IMPLANT
BLADE SURG 15 STRL SS (BLADE) ×1
BNDG ELASTIC 4X5.8 VLCR STR LF (GAUZE/BANDAGES/DRESSINGS) IMPLANT
BNDG ELASTIC 6X5.8 VLCR STR LF (GAUZE/BANDAGES/DRESSINGS) IMPLANT
BNDG GAUZE ELAST 4 BULKY (GAUZE/BANDAGES/DRESSINGS) IMPLANT
CANISTER SUCT 3000ML PPV (MISCELLANEOUS) ×4 IMPLANT
CANNULA ARTERIAL NVNT 3/8 22FR (MISCELLANEOUS) ×4 IMPLANT
CATH ROBINSON RED A/P 18FR (CATHETERS) ×8 IMPLANT
CATH THORACIC 28FR (CATHETERS) ×8 IMPLANT
CATH THORACIC 36FR (CATHETERS) ×4 IMPLANT
CATH THORACIC 36FR RT ANG (CATHETERS) ×4 IMPLANT
CLIP VESOCCLUDE MED 24/CT (CLIP) ×8 IMPLANT
CLIP VESOCCLUDE SM WIDE 24/CT (CLIP) ×24 IMPLANT
CONN 3/8X3/8 GISH STERILE (MISCELLANEOUS) ×4 IMPLANT
COVER MAYO STAND STRL (DRAPES) IMPLANT
COVER SURGICAL LIGHT HANDLE (MISCELLANEOUS) ×4 IMPLANT
CUFF TOURN SGL QUICK 18X4 (TOURNIQUET CUFF) IMPLANT
CUFF TOURN SGL QUICK 24 (TOURNIQUET CUFF)
CUFF TRNQT CYL 24X4X16.5-23 (TOURNIQUET CUFF) IMPLANT
DRAPE CARDIOVASCULAR INCISE (DRAPES) ×1
DRAPE EXTREMITY T 121X128X90 (DISPOSABLE) ×4 IMPLANT
DRAPE HALF SHEET 40X57 (DRAPES) ×4 IMPLANT
DRAPE INCISE IOBAN 66X45 STRL (DRAPES) ×4 IMPLANT
DRAPE SLUSH/WARMER DISC (DRAPES) ×4 IMPLANT
DRAPE SRG 135X102X78XABS (DRAPES) ×3 IMPLANT
DRSG COVADERM 4X10 (GAUZE/BANDAGES/DRESSINGS) ×4 IMPLANT
DRSG COVADERM 4X14 (GAUZE/BANDAGES/DRESSINGS) IMPLANT
ELECT BLADE 4.0 EZ CLEAN MEGAD (MISCELLANEOUS) ×4
ELECT CAUTERY BLADE 6.4 (BLADE) ×4 IMPLANT
ELECT REM PT RETURN 9FT ADLT (ELECTROSURGICAL) ×8
ELECTRODE BLDE 4.0 EZ CLN MEGD (MISCELLANEOUS) ×3 IMPLANT
ELECTRODE REM PT RTRN 9FT ADLT (ELECTROSURGICAL) ×6 IMPLANT
FELT TEFLON 1X6 (MISCELLANEOUS) ×4 IMPLANT
GAUZE SPONGE 4X4 12PLY STRL (GAUZE/BANDAGES/DRESSINGS) IMPLANT
GAUZE SPONGE 4X4 12PLY STRL LF (GAUZE/BANDAGES/DRESSINGS) ×4 IMPLANT
GEL ULTRASOUND 20GR AQUASONIC (MISCELLANEOUS) ×4 IMPLANT
GLOVE BIO SURGEON STRL SZ 6 (GLOVE) IMPLANT
GLOVE BIO SURGEON STRL SZ 6.5 (GLOVE) IMPLANT
GLOVE BIO SURGEON STRL SZ7 (GLOVE) IMPLANT
GLOVE BIO SURGEON STRL SZ7.5 (GLOVE) ×4 IMPLANT
GLOVE BIOGEL PI IND STRL 6 (GLOVE) IMPLANT
GLOVE BIOGEL PI IND STRL 6.5 (GLOVE) IMPLANT
GLOVE BIOGEL PI IND STRL 7.0 (GLOVE) IMPLANT
GLOVE BIOGEL PI INDICATOR 6 (GLOVE)
GLOVE BIOGEL PI INDICATOR 6.5 (GLOVE)
GLOVE BIOGEL PI INDICATOR 7.0 (GLOVE)
GLOVE ECLIPSE 6.5 STRL STRAW (GLOVE) ×16 IMPLANT
GLOVE ORTHO TXT STRL SZ7.5 (GLOVE) IMPLANT
GLOVE SS BIOGEL STRL SZ 6.5 (GLOVE) ×3 IMPLANT
GLOVE SUPERSENSE BIOGEL SZ 6.5 (GLOVE) ×1
GLOVE SURG SS PI 6.0 STRL IVOR (GLOVE) ×4 IMPLANT
GLOVE SURG SS PI 6.5 STRL IVOR (GLOVE) ×12 IMPLANT
GLOVE TRIUMPH SURG SIZE 7.0 (KITS) ×8 IMPLANT
GOWN STRL REUS W/ TWL LRG LVL3 (GOWN DISPOSABLE) ×15 IMPLANT
GOWN STRL REUS W/ TWL XL LVL3 (GOWN DISPOSABLE) ×6 IMPLANT
GOWN STRL REUS W/TWL LRG LVL3 (GOWN DISPOSABLE) ×5
GOWN STRL REUS W/TWL XL LVL3 (GOWN DISPOSABLE) ×2
HEMOSTAT POWDER SURGIFOAM 1G (HEMOSTASIS) ×12 IMPLANT
HEMOSTAT SURGICEL 2X14 (HEMOSTASIS) ×4 IMPLANT
INSERT FOGARTY 61MM (MISCELLANEOUS) IMPLANT
INSERT FOGARTY XLG (MISCELLANEOUS) ×4 IMPLANT
KIT BASIN OR (CUSTOM PROCEDURE TRAY) ×4 IMPLANT
KIT CATH CPB BARTLE (MISCELLANEOUS) ×4 IMPLANT
KIT SUCTION CATH 14FR (SUCTIONS) ×4 IMPLANT
KIT TURNOVER KIT B (KITS) ×4 IMPLANT
KIT VASOVIEW HEMOPRO 2 VH 4000 (KITS) IMPLANT
NS IRRIG 1000ML POUR BTL (IV SOLUTION) ×20 IMPLANT
PACK E OPEN HEART (SUTURE) ×4 IMPLANT
PACK OPEN HEART (CUSTOM PROCEDURE TRAY) ×4 IMPLANT
PAD ARMBOARD 7.5X6 YLW CONV (MISCELLANEOUS) ×8 IMPLANT
PAD ELECT DEFIB RADIOL ZOLL (MISCELLANEOUS) ×4 IMPLANT
PENCIL BUTTON HOLSTER BLD 10FT (ELECTRODE) ×4 IMPLANT
POSITIONER HEAD DONUT 9IN (MISCELLANEOUS) ×4 IMPLANT
PUNCH AORTIC ROTATE 4.0MM (MISCELLANEOUS) ×4 IMPLANT
PUNCH AORTIC ROTATE 4.5MM 8IN (MISCELLANEOUS) IMPLANT
PUNCH AORTIC ROTATE 5MM 8IN (MISCELLANEOUS) IMPLANT
SET CARDIOPLEGIA MPS 5001102 (MISCELLANEOUS) ×4 IMPLANT
SHEARS HARMONIC 9CM CVD (BLADE) IMPLANT
SPONGE INTESTINAL PEANUT (DISPOSABLE) IMPLANT
SPONGE LAP 18X18 RF (DISPOSABLE) IMPLANT
SPONGE LAP 4X18 RFD (DISPOSABLE) ×4 IMPLANT
SUPPORT HEART JANKE-BARRON (MISCELLANEOUS) ×4 IMPLANT
SUT BONE WAX W31G (SUTURE) ×4 IMPLANT
SUT MNCRL AB 4-0 PS2 18 (SUTURE) IMPLANT
SUT PROLENE 3 0 SH DA (SUTURE) ×4 IMPLANT
SUT PROLENE 3 0 SH1 36 (SUTURE) ×4 IMPLANT
SUT PROLENE 4 0 RB 1 (SUTURE)
SUT PROLENE 4 0 SH DA (SUTURE) IMPLANT
SUT PROLENE 4-0 RB1 .5 CRCL 36 (SUTURE) IMPLANT
SUT PROLENE 5 0 C 1 36 (SUTURE) IMPLANT
SUT PROLENE 6 0 C 1 30 (SUTURE) ×4 IMPLANT
SUT PROLENE 7 0 BV 1 (SUTURE) IMPLANT
SUT PROLENE 7 0 BV1 MDA (SUTURE) ×4 IMPLANT
SUT PROLENE 8 0 BV175 6 (SUTURE) IMPLANT
SUT SILK  1 MH (SUTURE) ×1
SUT SILK 1 MH (SUTURE) ×3 IMPLANT
SUT SILK 2 0 SH (SUTURE) ×4 IMPLANT
SUT SILK 2 0 SH CR/8 (SUTURE) ×4 IMPLANT
SUT STEEL STERNAL CCS#1 18IN (SUTURE) IMPLANT
SUT STEEL SZ 6 DBL 3X14 BALL (SUTURE) IMPLANT
SUT VIC AB 1 CTX 36 (SUTURE) ×2
SUT VIC AB 1 CTX36XBRD ANBCTR (SUTURE) ×6 IMPLANT
SUT VIC AB 2-0 CT1 27 (SUTURE)
SUT VIC AB 2-0 CT1 TAPERPNT 27 (SUTURE) IMPLANT
SUT VIC AB 2-0 CTX 27 (SUTURE) IMPLANT
SUT VIC AB 3-0 SH 27 (SUTURE)
SUT VIC AB 3-0 SH 27X BRD (SUTURE) IMPLANT
SUT VIC AB 3-0 X1 27 (SUTURE) ×4 IMPLANT
SUT VICRYL 4-0 PS2 18IN ABS (SUTURE) IMPLANT
SYR 20CC LL (SYRINGE) ×4 IMPLANT
SYSTEM SAHARA CHEST DRAIN ATS (WOUND CARE) ×8 IMPLANT
TAPE CLOTH SURG 4X10 WHT LF (GAUZE/BANDAGES/DRESSINGS) ×4 IMPLANT
TAPE PAPER 2X10 WHT MICROPORE (GAUZE/BANDAGES/DRESSINGS) ×4 IMPLANT
TOWEL GREEN STERILE (TOWEL DISPOSABLE) ×4 IMPLANT
TOWEL GREEN STERILE FF (TOWEL DISPOSABLE) ×4 IMPLANT
TRAY FOLEY SLVR 16FR TEMP STAT (SET/KITS/TRAYS/PACK) ×4 IMPLANT
TUBE CONNECTING 20X1/4 (TUBING) ×4 IMPLANT
TUBING LAP HI FLOW INSUFFLATIO (TUBING) IMPLANT
UNDERPAD 30X36 HEAVY ABSORB (UNDERPADS AND DIAPERS) ×8 IMPLANT
WATER STERILE IRR 1000ML POUR (IV SOLUTION) ×8 IMPLANT

## 2020-11-26 NOTE — Progress Notes (Signed)
Family Medicine Teaching Service Daily Progress Note Intern Pager: [phone]  Patient name: Ryan Clarke Medical record number: [id number] Date of birth: 09-Sep-1956 Age: 65 y.o. Gender: male  Primary Care Provider: [name], MD Consultants: Cards, TCTS Code Status: Full  Pt Overview and Major Events to Date:  11/17/2020- admitted for unstable angina 11/19/2020- Left Heart Cath 11/26/2020- CABG procedure  Assessment and Plan: [name] a 65 y.o.malewho presents with chest pain secondary to unstable angina awaiting CABG. PMHx significant for:CAD s/p multiple stents, s/p dual chamber St. Jude pacemaker, pAF, HTN.  Unstable angina History of extensive CAD with multiple stents (30). LHC this admissionshowed 80% ostial LAD stenosis and restenosis of mCX.TTE with preserved LVEF. Patient undergoing CABG today. - Cardiology and TCTS following, appreciate involvement - Patient CABG today - f/u Cards, TCTS recs following CABG  HTN Normotensive today. Amlodipine 5 mg, Imdur 30 mg added this admission. Home meds: metoprolol succinate 25 mg daily - continue metoprolol, amlodipine, and imdur  PAF Currently normal rate with regular rhythm.Patient has been sinus brady during this admissionand no evidence of recurrent atrial fibrillation. Recommend patient follow up with cardiology as outpatient. - Continue Toprol XL25mg  daily   FEN/GI: Carb Modified, NPO after midninght PPx: Heparin gtt   Status is: Inpatient  Remains inpatient appropriate because:CABG   Dispo:  Patient From: Home  Planned Disposition: Home  Expected discharge date: 11/29/2020  Medically stable for discharge: No   Subjective:  Per TCTS note, no issues at this time and prepared for CABG.  Objective: Temp:  [97.7 F (36.5 C)-98 F (36.7 C)] 97.7 F (36.5 C) (01/10 0514) Pulse Rate:  [57-77] 57 (01/10 0708) Resp:  [11-29] 15 (01/10 0708) BP: (139-167)/(75-92) 167/92 (01/10 0703) SpO2:  [81 %-99 %]  96 % (01/10 0708) Weight:  [129.7 kg] 129.7 kg (01/10 0514) Physical Exam:  Patient being prepped for CABG.  Laboratory: Recent Labs  Lab 11/24/20 0224 11/25/20 0214 11/26/20 0205 11/26/20 0809 11/26/20 1137 11/26/20 1218 11/26/20 1220  WBC 6.7 6.0 7.1  --   --   --   --   HGB 15.7 15.6 15.9   < > 11.9* 12.6* 13.0  HCT 48.7 46.3 46.5   < > 35.0* 37.0* 39.6  PLT 167 175 180  --   --   --  189   < > = values in this interval not displayed.   Recent Labs  Lab 11/20/20 0602 11/25/20 1644 11/26/20 0205 11/26/20 0809 11/26/20 0924 11/26/20 1021 11/26/20 1120 11/26/20 1124 11/26/20 1137 11/26/20 1218  NA 140 141 139   < > 139 140   < > 141 139 140  K 3.7 3.8 3.6   < > 4.3 4.0   < > 4.2 4.2 4.2  CL 102 103 102   < > 102 103  --   --  99  --   CO2 28 27 25   --   --   --   --   --   --   --   BUN 9 11 13    < > 13 11  --   --  11  --   CREATININE 0.80 0.85 0.86   < > 0.80 0.60*  --   --  0.70  --   CALCIUM 8.9 9.5 9.1  --   --   --   --   --   --   --   PROT  --  7.3  --   --   --   --   --   --   --   --  BILITOT  --  2.0*  --   --   --   --   --   --   --   --   ALKPHOS  --  64  --   --   --   --   --   --   --   --   ALT  --  88*  --   --   --   --   --   --   --   --   AST  --  54*  --   --   --   --   --   --   --   --   GLUCOSE 94 106* 90   < > 102* 100*  --   --  112*  --    < > = values in this interval not displayed.    Heparin- 0.61  Imaging/Diagnostic Tests:  EXAM: CHEST - 2 VIEW  COMPARISON:  November 16, 2020  FINDINGS: Stable pacemaker. The heart, hila, mediastinum, lungs, and pleura are otherwise unremarkable.  IMPRESSION: No active cardiopulmonary disease.   Electronically Signed   By: Dorise Bullion III M.D   On: 11/25/2020 20:08  Delora Fuel, MD 11/26/2020, 12:45 PM PGY-1, [place] Intern pager: [phone], text pages welcome

## 2020-11-26 NOTE — Anesthesia Preprocedure Evaluation (Signed)
Anesthesia Evaluation  Patient identified by MRN, date of birth, ID band Patient awake    Reviewed: Allergy & Precautions, H&P , NPO status , Patient's Chart, lab work & pertinent test results  Airway Mallampati: II  TM Distance: >3 FB Neck ROM: Full    Dental no notable dental hx.    Pulmonary neg pulmonary ROS,    Pulmonary exam normal breath sounds clear to auscultation       Cardiovascular hypertension, + CAD  Normal cardiovascular exam+ dysrhythmias Atrial Fibrillation + pacemaker  Rhythm:Regular Rate:Normal     Neuro/Psych negative neurological ROS  negative psych ROS   GI/Hepatic negative GI ROS, Neg liver ROS,   Endo/Other  negative endocrine ROS  Renal/GU negative Renal ROS  negative genitourinary   Musculoskeletal negative musculoskeletal ROS (+)   Abdominal   Peds negative pediatric ROS (+)  Hematology negative hematology ROS (+)   Anesthesia Other Findings   Reproductive/Obstetrics negative OB ROS                             Anesthesia Physical Anesthesia Plan  ASA: III  Anesthesia Plan: General   Post-op Pain Management:    Induction: Intravenous  PONV Risk Score and Plan: 0  Airway Management Planned: Oral ETT  Additional Equipment: Arterial line, CVP, PA Cath, TEE and Ultrasound Guidance Line Placement  Intra-op Plan:   Post-operative Plan: Post-operative intubation/ventilation  Informed Consent: I have reviewed the patients History and Physical, chart, labs and discussed the procedure including the risks, benefits and alternatives for the proposed anesthesia with the patient or authorized representative who has indicated his/her understanding and acceptance.     Dental advisory given  Plan Discussed with: CRNA and Surgeon  Anesthesia Plan Comments:         Anesthesia Quick Evaluation

## 2020-11-26 NOTE — Transfer of Care (Signed)
Immediate Anesthesia Transfer of Care Note  Patient: Ryan Clarke  Procedure(s) Performed: CORONARY ARTERY BYPASS GRAFTING (CABG) TIMES TWO USING BILATERAL INTERNAL MAMMARY ARTERIES (N/A Chest) TRANSESOPHAGEAL ECHOCARDIOGRAM (TEE) (N/A )  Patient Location: ICU  Anesthesia Type:General  Level of Consciousness: Patient remains intubated per anesthesia plan  Airway & Oxygen Therapy: Patient remains intubated per anesthesia plan and Patient placed on Ventilator (see vital sign flow sheet for setting)  Post-op Assessment: Report given to RN and Post -op Vital signs reviewed and stable  Post vital signs: Reviewed and stable  Last Vitals:  Vitals Value Taken Time  BP 121/64 11/26/20 14:13  Temp    Pulse 80 11/26/20 14:13  Resp 16 11/26/20 14:13  SpO2 97 11/26/20 14:13    Last Pain:  Vitals:   11/26/20 0514  TempSrc: Oral  PainSc:       Patients Stated Pain Goal: 0 (37/10/62 6948)  Complications: No complications documented.

## 2020-11-26 NOTE — Op Note (Signed)
CARDIOVASCULAR SURGERY OPERATIVE NOTE  11/26/2020  Surgeon:  Gaye Pollack, MD  First Assistant: Jadene Pierini, PA-C   Preoperative Diagnosis:  Severe multi-vessel coronary artery disease   Postoperative Diagnosis:  Same   Procedure:  1. Median Sternotomy 2. Extracorporeal circulation 3.   Coronary artery bypass grafting x 2   Left internal mammary artery graft to the LAD  Free right internal mammary artery graft to the Diagonal 1    Anesthesia:  General Endotracheal   Clinical History/Surgical Indication:  This 65 year old gentleman has a 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 Cath shows 80% ostial LAD stenosis which appears to be immediately proximal to the proximal stent as well as diffuse 60% narrowing in the proximal and mid stented segment. There are two diagonal branchs and the first has a patent stent. The second diagonal is small. The left circumflex has 80% stenosis within the previous stent beyond the marginal branch and the marginal branch is patent with mild ostial narrowing. I don't think there is enough stenosis to warrant grafting of the marginal branch because the graft would likely occlude due to competitive flow. The distal LCX is small and not graftable. The RCA stents are patent and don't require grafting. The main benefit for him would be grafting the distal LAD and probably the first diagonal with arterial grafts, either bilateral IMA or LIMA and left radial. He has an intricate tatoo on the left forearm so I would like to avoid that area unless needed. His arterial dopplers do show patent palmar arches bilaterally. He said he would agree to using the left radial if I felt it was needed. It is also possible that he could develop stenosis in the future in any of the other currently patent stents.  He has been on  Effient and the last dose was yesterday so he will have to wait until next Monday 11/26/20 to do surgery. Cardiology will decide if patient is to stay in the hospital on heparin until then. I discussed the operative procedure with the patient including alternatives, benefits and risks; including but not limited to bleeding, blood transfusion, infection, stroke, myocardial infarction, graft failure, heart block requiring a permanent pacemaker, organ dysfunction, and death.  Carlyon Shadow understands and agrees to proceed.   Preparation:  The patient was seen in the preoperative holding area and the correct patient, correct operation were confirmed with the patient after reviewing the medical record and catheterization. The consent was signed by me. Preoperative antibiotics were given. A pulmonary arterial line and radial arterial line were placed by the anesthesia team. The patient was taken back to the operating room and positioned supine on the operating room table. After being placed under general endotracheal anesthesia by the anesthesia team a foley catheter was placed. The neck, chest, abdomen, and both legs were prepped with betadine soap and solution and draped in the usual sterile manner. A surgical time-out was taken and the correct patient and operative procedure were confirmed with the nursing and anesthesia staff.   Cardiopulmonary Bypass:  A median sternotomy was performed. The pericardium was opened in the midline. Right ventricular function appeared normal. The ascending aorta was of normal size and had no palpable plaque. There were no contraindications to aortic cannulation or cross-clamping. The patient was fully systemically heparinized and the ACT was maintained > 400 sec. The proximal aortic arch was cannulated with a 38 F aortic cannula for arterial inflow.  Venous cannulation was performed via the right atrial appendage using a two-staged venous cannula. An antegrade cardioplegia/vent  cannula was inserted into the mid-ascending aorta. Aortic occlusion was performed with a single cross-clamp. Systemic cooling to 32 degrees Centigrade and topical cooling of the heart with iced saline were used. Hyperkalemic antegrade cold blood cardioplegia was used to induce diastolic arrest and was then given at about 20 minute intervals throughout the period of arrest to maintain myocardial temperature at or below 10 degrees centigrade. A temperature probe was inserted into the interventricular septum and an insulating pad was placed in the pericardium.   Left internal mammary artery harvest:  The left side of the sternum was retracted using the Rultract retractor. The left internal mammary artery was harvested as a pedicle graft. All side branches were clipped. It was a large-sized vessel of good quality with excellent blood flow. It was ligated distally and divided. It was sprayed with topical papaverine solution to prevent vasospasm.  Right internal mammary artery harvest:  The right side of the sternum was retracted using the Rultract retractor. The right internal mammary artery was harvested as a free graft since it was not long enough to reach the distal portion of the D1 beyond the stents. All side branches were clipped. It was a large-sized vessel of good quality with excellent blood flow. It was ligated distally and divided. It was sprayed with topical papaverine solution to prevent vasospasm.    Coronary arteries:  The coronary arteries were examined.   LAD:  Diffusely stented to the junction of the mid and distal vessel. Beyond this there was segmental disease. The D1 was stented to the junction of the middle and distal vessel. Beyond the stents it was still suitable for a graft.     Grafts:  1. LIMA to the LAD: 2.0 mm. It was sewn end to side using 8-0 prolene continuous suture. 2. Free RIMA to the D1:  1.6 mm. It was sewn end to side using 8-0 prolene continuous  suture.    The proximal free RIMA graft anastomosis were performed to the mid-ascending aorta using continuous 7-0 prolene suture. A graft marker was placed around the proximal anastomosis.   Completion:  The patient was rewarmed to 37 degrees Centigrade. The clamp was removed from the LIMA pedicle and there was rapid warming of the septum and return of ventricular fibrillation. The crossclamp was removed with a time of 65 minutes. There was spontaneous return of sinus rhythm. The distal and proximal anastomoses were checked for hemostasis. The position of the grafts was satisfactory. Two temporary epicardial pacing wires were placed on the right atrium and two on the right ventricle. The patient was weaned from CPB without difficulty on no inotropes. CPB time was 86 minutes. Cardiac output was 6 LPM. TEE showed normal LV systolic function. Heparin was fully reversed with protamine and the aortic and venous cannulas removed. Hemostasis was achieved. Two mediastinal and bilateral pleural drainage tubes were placed. The sternum was closed with double #6 stainless steel wires. The fascia was closed with continuous # 1 vicryl suture. The subcutaneous tissue was closed with 2-0 vicryl continuous suture. The skin was closed with 3-0 vicryl subcuticular suture. All sponge, needle, and instrument counts were reported correct at the end of the case. Dry sterile dressings were placed over the incisions and around the chest tubes which were connected to pleurevac suction. The patient was then transported to the surgical intensive care unit in stable condition.

## 2020-11-26 NOTE — Anesthesia Procedure Notes (Signed)
Procedure Name: Intubation Date/Time: 11/26/2020 7:58 AM Performed by: Reece Agar, CRNA Pre-anesthesia Checklist: Patient identified, Emergency Drugs available, Suction available and Patient being monitored Patient Re-evaluated:Patient Re-evaluated prior to induction Oxygen Delivery Method: Circle System Utilized Preoxygenation: Pre-oxygenation with 100% oxygen Induction Type: IV induction Ventilation: Mask ventilation without difficulty and Oral airway inserted - appropriate to patient size Laryngoscope Size: Mac and 4 Grade View: Grade I Tube type: Oral Tube size: 8.0 mm Number of attempts: 1 Airway Equipment and Method: Stylet and Oral airway Placement Confirmation: ETT inserted through vocal cords under direct vision,  positive ETCO2 and breath sounds checked- equal and bilateral Secured at: 23 cm Tube secured with: Tape Dental Injury: Teeth and Oropharynx as per pre-operative assessment

## 2020-11-26 NOTE — Anesthesia Procedure Notes (Signed)
Anesthesia Procedure Image    

## 2020-11-26 NOTE — Progress Notes (Signed)
TCTS Preop Note:  He had a stable weekend without problems. Says he is ready for CABG this am. He has no further questions.

## 2020-11-26 NOTE — Anesthesia Procedure Notes (Signed)
Central Venous Catheter Insertion Performed by: Myrtie Soman, MD, anesthesiologist Start/End1/08/2021 7:06 AM, 11/26/2020 7:25 AM Patient location: Pre-op. Preanesthetic checklist: patient identified, IV checked, site marked, risks and benefits discussed, surgical consent, monitors and equipment checked, pre-op evaluation, timeout performed and anesthesia consent Position: Trendelenburg Lidocaine 1% used for infiltration and patient sedated Hand hygiene performed  and maximum sterile barriers used  Catheter size: 8.5 Fr PA cath was placed.Sheath introducer Swan type:thermodilution PA Cath depth:47 Procedure performed using ultrasound guided technique. Ultrasound Notes:anatomy identified, needle tip was noted to be adjacent to the nerve/plexus identified, no ultrasound evidence of intravascular and/or intraneural injection and image(s) printed for medical record Attempts: 1 Following insertion, line sutured, dressing applied and Biopatch. Post procedure assessment: blood return through all ports, free fluid flow and no air  Patient tolerated the procedure well with no immediate complications.

## 2020-11-26 NOTE — Progress Notes (Signed)
EVENING ROUNDS NOTE :     [place] [place].[address]       [place] [address]             [phone]                 Day of Surgery Procedure(s) (LRB): CORONARY ARTERY BYPASS GRAFTING (CABG) TIMES TWO USING BILATERAL INTERNAL MAMMARY ARTERIES (N/A) TRANSESOPHAGEAL ECHOCARDIOGRAM (TEE) (N/A)   Total Length of Stay:  LOS: 8 days  Events:   Stable since earlier event Remains on 70% vent Low CT output    BP (!) 84/61   Pulse 90   Temp 99.86 F (37.7 C)   Resp 14   Ht 6\' 1"  (1.854 m)   Wt 129.7 kg   SpO2 96%   BMI 37.73 kg/m   PAP: (24-40)/(14-26) 31/20 CO:  [4 L/min-4.5 L/min] 4.5 L/min CI:  [1.6 L/min/m2-1.8 L/min/m2] 1.8 L/min/m2  Vent Mode: SIMV;PRVC;PSV FiO2 (%):  [50 %-100 %] 70 % Set Rate:  [12 bmp] 12 bmp Vt Set:  [630 mL-700 mL] 700 mL PEEP:  [5 cmH20] 5 cmH20 Pressure Support:  [10 cmH20] 10 cmH20 Plateau Pressure:  [19 cmH20] 19 cmH20  . sodium chloride    . [START ON 11/27/2020] sodium chloride    . sodium chloride 10 mL/hr at 11/26/20 1400  . albumin human 12.5 g (11/26/20 1500)  . amiodarone 60 mg/hr (11/26/20 1900)   Followed by  . amiodarone    . cefUROXime (ZINACEF)  IV Stopped (11/26/20 1844)  . dexmedetomidine (PRECEDEX) IV infusion 0.7 mcg/kg/hr (11/26/20 1900)  . famotidine (PEPCID) IV    . insulin 1.4 mL/hr at 11/26/20 1900  . lactated ringers    . lactated ringers    . lactated ringers 20 mL/hr at 11/26/20 1900  . nitroGLYCERIN Stopped (11/26/20 1450)  . phenylephrine (NEO-SYNEPHRINE) Adult infusion 25 mcg/min (11/26/20 1900)  . potassium chloride 50 mL/hr at 11/26/20 1900  . vancomycin      I/O last 3 completed shifts: In: 5282.5 [P.O.:240; I.V.:3323.6; Blood:975; IV HERDEYCXK:481] Out: 8563 [Urine:1400; Blood:1400; Chest Tube:238]   CBC Latest Ref Rng & Units 11/26/2020 11/26/2020 11/26/2020  WBC 4.0 - 10.5 K/uL - 12.8(H) -  Hemoglobin 13.0 - 17.0 g/dL 12.9(L) 13.3 12.9(L)  Hematocrit 39.0 - 52.0 % 38.0(L) 40.7 38.0(L)   Platelets 150 - 400 K/uL - 133(L) -    BMP Latest Ref Rng & Units 11/26/2020 11/26/2020 11/26/2020  Glucose 70 - 99 mg/dL - 134(H) -  BUN 8 - 23 mg/dL - 10 -  Creatinine 0.61 - 1.24 mg/dL - 0.60(L) -  BUN/Creat Ratio 10 - 24 - - -  Sodium 135 - 145 mmol/L 141 143 144  Potassium 3.5 - 5.1 mmol/L 3.9 3.7 3.7  Chloride 98 - 111 mmol/L - 99 -  CO2 22 - 32 mmol/L - - -  Calcium 8.9 - 10.3 mg/dL - - -    ABG    Component Value Date/Time   PHART 7.351 11/26/2020 1415   PCO2ART 45.4 11/26/2020 1415   PO2ART 68 (L) 11/26/2020 1415   HCO3 25.7 11/26/2020 1415   TCO2 27 11/26/2020 1415   ACIDBASEDEF 1.0 11/26/2020 1415   O2SAT 94.0 11/26/2020 1415       Ryan Vachon, MD 11/26/2020 7:08 PM

## 2020-11-26 NOTE — Progress Notes (Signed)
Echocardiogram 2D Echocardiogram has been performed.  Ryan Clarke 11/26/2020, 8:31 AM

## 2020-11-26 NOTE — Anesthesia Procedure Notes (Signed)
Arterial Line Insertion Start/End1/08/2021 6:45 AM, 11/26/2020 6:55 AM Performed by: Reece Agar, CRNA, CRNA  Patient location: Pre-op. Preanesthetic checklist: patient identified, IV checked, site marked, risks and benefits discussed, surgical consent, monitors and equipment checked, pre-op evaluation, timeout performed and anesthesia consent Lidocaine 1% used for infiltration Right, radial was placed Catheter size: 20 G Hand hygiene performed , maximum sterile barriers used  and Seldinger technique used Allen's test indicative of satisfactory collateral circulation Attempts: 2 Procedure performed without using ultrasound guided technique. Following insertion, dressing applied and Biopatch. Post procedure assessment: normal and unchanged  Patient tolerated the procedure well with no immediate complications.

## 2020-11-26 NOTE — Brief Op Note (Signed)
11/16/2020 - 11/26/2020  1:45 PM  PATIENT:  Ryan Clarke  65 y.o. male  PRE-OPERATIVE DIAGNOSIS:  CAD  POST-OPERATIVE DIAGNOSIS:  CAD  PROCEDURE:  Procedure(s) with comments: CORONARY ARTERY BYPASS GRAFTING (CABG) TIMES TWO USING BILATERAL INTERNAL MAMMARY ARTERIES (N/A) - BIMA TRANSESOPHAGEAL ECHOCARDIOGRAM (TEE) (N/A)  SURGEON:  Surgeon(s) and Role:    * Bartle, Fernande Boyden, MD - Primary  PHYSICIAN ASSISTANT: Huel Centola PA-C  ASSISTANTS: STAFF   ANESTHESIA:   general  EBL:  1400 mL   BLOOD ADMINISTERED:none  DRAINS: MEDIASTINAL CHEST TUBES   LOCAL MEDICATIONS USED:  NONE  SPECIMEN:  No Specimen  DISPOSITION OF SPECIMEN:  N/A  COUNTS:  YES  TOURNIQUET:  * No tourniquets in log *  DICTATION: .Dragon Dictation  PLAN OF CARE: Admit to inpatient   PATIENT DISPOSITION:  ICU - intubated and hemodynamically stable.   Delay start of Pharmacological VTE agent (>24hrs) due to surgical blood loss or risk of bleeding: yes  COMPLICATIONS: NO KNOWN

## 2020-11-27 ENCOUNTER — Encounter (HOSPITAL_COMMUNITY): Payer: Self-pay | Admitting: Surgery

## 2020-11-27 ENCOUNTER — Inpatient Hospital Stay (HOSPITAL_COMMUNITY): Payer: Medicare HMO

## 2020-11-27 LAB — POCT I-STAT 7, (LYTES, BLD GAS, ICA,H+H)
Acid-base deficit: 1 mmol/L (ref 0.0–2.0)
Acid-base deficit: 1 mmol/L (ref 0.0–2.0)
Acid-base deficit: 2 mmol/L (ref 0.0–2.0)
Bicarbonate: 22.5 mmol/L (ref 20.0–28.0)
Bicarbonate: 22.5 mmol/L (ref 20.0–28.0)
Bicarbonate: 22.5 mmol/L (ref 20.0–28.0)
Calcium, Ion: 1.2 mmol/L (ref 1.15–1.40)
Calcium, Ion: 1.21 mmol/L (ref 1.15–1.40)
Calcium, Ion: 1.21 mmol/L (ref 1.15–1.40)
HCT: 41 % (ref 39.0–52.0)
HCT: 40 % (ref 39.0–52.0)
HCT: 41 % (ref 39.0–52.0)
Hemoglobin: 13.9 g/dL (ref 13.0–17.0)
Hemoglobin: 13.6 g/dL (ref 13.0–17.0)
Hemoglobin: 13.9 g/dL (ref 13.0–17.0)
O2 Saturation: 91 %
O2 Saturation: 92 %
O2 Saturation: 93 %
Patient temperature: 98
Patient temperature: 37.4
Patient temperature: 37.7
Potassium: 4.4 mmol/L (ref 3.5–5.1)
Potassium: 3.9 mmol/L (ref 3.5–5.1)
Potassium: 4 mmol/L (ref 3.5–5.1)
Sodium: 138 mmol/L (ref 135–145)
Sodium: 139 mmol/L (ref 135–145)
Sodium: 139 mmol/L (ref 135–145)
TCO2: 23 mmol/L (ref 22–32)
TCO2: 24 mmol/L (ref 22–32)
TCO2: 24 mmol/L (ref 22–32)
pCO2 arterial: 33 mmHg (ref 32.0–48.0)
pCO2 arterial: 35 mmHg (ref 32.0–48.0)
pCO2 arterial: 36.5 mmHg (ref 32.0–48.0)
pH, Arterial: 7.44 (ref 7.350–7.450)
pH, Arterial: 7.4 (ref 7.350–7.450)
pH, Arterial: 7.419 (ref 7.350–7.450)
pO2, Arterial: 58 mmHg — ABNORMAL LOW (ref 83.0–108.0)
pO2, Arterial: 64 mmHg — ABNORMAL LOW (ref 83.0–108.0)
pO2, Arterial: 67 mmHg — ABNORMAL LOW (ref 83.0–108.0)

## 2020-11-27 LAB — GLUCOSE, CAPILLARY
Glucose-Capillary: 114 mg/dL — ABNORMAL HIGH (ref 70–99)
Glucose-Capillary: 114 mg/dL — ABNORMAL HIGH (ref 70–99)
Glucose-Capillary: 114 mg/dL — ABNORMAL HIGH (ref 70–99)
Glucose-Capillary: 115 mg/dL — ABNORMAL HIGH (ref 70–99)
Glucose-Capillary: 115 mg/dL — ABNORMAL HIGH (ref 70–99)
Glucose-Capillary: 116 mg/dL — ABNORMAL HIGH (ref 70–99)
Glucose-Capillary: 118 mg/dL — ABNORMAL HIGH (ref 70–99)
Glucose-Capillary: 119 mg/dL — ABNORMAL HIGH (ref 70–99)
Glucose-Capillary: 120 mg/dL — ABNORMAL HIGH (ref 70–99)
Glucose-Capillary: 120 mg/dL — ABNORMAL HIGH (ref 70–99)
Glucose-Capillary: 125 mg/dL — ABNORMAL HIGH (ref 70–99)
Glucose-Capillary: 147 mg/dL — ABNORMAL HIGH (ref 70–99)
Glucose-Capillary: 97 mg/dL (ref 70–99)

## 2020-11-27 LAB — BASIC METABOLIC PANEL
Anion gap: 9 (ref 5–15)
Anion gap: 9 (ref 5–15)
BUN: 9 mg/dL (ref 8–23)
BUN: 10 mg/dL (ref 8–23)
CO2: 22 mmol/L (ref 22–32)
CO2: 21 mmol/L — ABNORMAL LOW (ref 22–32)
Calcium: 8.4 mg/dL — ABNORMAL LOW (ref 8.9–10.3)
Calcium: 8 mg/dL — ABNORMAL LOW (ref 8.9–10.3)
Chloride: 105 mmol/L (ref 98–111)
Chloride: 106 mmol/L (ref 98–111)
Creatinine, Ser: 0.77 mg/dL (ref 0.61–1.24)
Creatinine, Ser: 0.79 mg/dL (ref 0.61–1.24)
GFR, Estimated: >60 mL/min (ref >60)
GFR, Estimated: >60 mL/min (ref >60)
Glucose, Bld: 124 mg/dL — ABNORMAL HIGH (ref 70–99)
Glucose, Bld: 125 mg/dL — ABNORMAL HIGH (ref 70–99)
Potassium: 4 mmol/L (ref 3.5–5.1)
Potassium: 3.8 mmol/L (ref 3.5–5.1)
Sodium: 136 mmol/L (ref 135–145)
Sodium: 136 mmol/L (ref 135–145)

## 2020-11-27 LAB — CBC
HCT: 41.8 % (ref 39.0–52.0)
HCT: 42.7 % (ref 39.0–52.0)
HCT: 41.7 % (ref 39.0–52.0)
Hemoglobin: 14 g/dL (ref 13.0–17.0)
Hemoglobin: 14.2 g/dL (ref 13.0–17.0)
Hemoglobin: 14.2 g/dL (ref 13.0–17.0)
MCH: 29.9 pg (ref 26.0–34.0)
MCH: 29.5 pg (ref 26.0–34.0)
MCH: 30.3 pg (ref 26.0–34.0)
MCHC: 33.5 g/dL (ref 30.0–36.0)
MCHC: 33.3 g/dL (ref 30.0–36.0)
MCHC: 34.1 g/dL (ref 30.0–36.0)
MCV: 89.1 fL (ref 80.0–100.0)
MCV: 88.8 fL (ref 80.0–100.0)
MCV: 89.1 fL (ref 80.0–100.0)
Platelets: 150 10*3/uL (ref 150–400)
Platelets: 150 10*3/uL (ref 150–400)
Platelets: 160 10*3/uL (ref 150–400)
RBC: 4.69 MIL/uL (ref 4.22–5.81)
RBC: 4.81 MIL/uL (ref 4.22–5.81)
RBC: 4.68 MIL/uL (ref 4.22–5.81)
RDW: 12.1 % (ref 11.5–15.5)
RDW: 12.2 % (ref 11.5–15.5)
RDW: 12.4 % (ref 11.5–15.5)
WBC: 11.1 10*3/uL — ABNORMAL HIGH (ref 4.0–10.5)
WBC: 11.7 10*3/uL — ABNORMAL HIGH (ref 4.0–10.5)
WBC: 13.5 10*3/uL — ABNORMAL HIGH (ref 4.0–10.5)
nRBC: 0 % (ref 0.0–0.2)
nRBC: 0 % (ref 0.0–0.2)
nRBC: 0 % (ref 0.0–0.2)

## 2020-11-27 LAB — MAGNESIUM
Magnesium: 1.8 mg/dL (ref 1.7–2.4)
Magnesium: 2.1 mg/dL (ref 1.7–2.4)

## 2020-11-27 LAB — CREATININE, SERUM
Creatinine, Ser: 0.79 mg/dL (ref 0.61–1.24)
GFR, Estimated: >60 mL/min (ref >60)

## 2020-11-27 MED ORDER — MAGNESIUM SULFATE 2 GM/50ML IV SOLN
2.0000 g | Freq: Once | INTRAVENOUS | Status: AC
  Administered 2020-11-27: 2 g via INTRAVENOUS
  Filled 2020-11-27: qty 50

## 2020-11-27 MED ORDER — SODIUM CHLORIDE 0.9% FLUSH: 10.0000 mL | INTRAVENOUS | Status: DC | PRN

## 2020-11-27 MED ORDER — ENOXAPARIN SODIUM 40 MG/0.4ML ~~LOC~~ SOLN
40.0000 mg | Freq: Every day | SUBCUTANEOUS | Status: DC
  Administered 2020-11-27 – 2020-11-28 (×2): 40 mg via SUBCUTANEOUS
  Filled 2020-11-27 (×2): qty 0.4

## 2020-11-27 MED ORDER — SODIUM CHLORIDE 0.9% FLUSH
10.0000 mL | Freq: Two times a day (BID) | INTRAVENOUS | Status: DC
  Administered 2020-11-27: 10 mL

## 2020-11-27 MED ORDER — MAGNESIUM SULFATE BOLUS VIA INFUSION: 2.0000 g | Freq: Once | INTRAVENOUS | Status: DC

## 2020-11-27 MED ORDER — FUROSEMIDE 10 MG/ML IJ SOLN
40.0000 mg | Freq: Once | INTRAMUSCULAR | Status: AC
  Administered 2020-11-27: 40 mg via INTRAVENOUS
  Filled 2020-11-27: qty 4

## 2020-11-27 MED ORDER — POTASSIUM CHLORIDE CRYS ER 20 MEQ PO TBCR
40.0000 meq | EXTENDED_RELEASE_TABLET | Freq: Once | ORAL | Status: AC
  Administered 2020-11-27: 40 meq via ORAL
  Filled 2020-11-27: qty 2

## 2020-11-27 MED ORDER — KETOROLAC TROMETHAMINE 15 MG/ML IJ SOLN
15.0000 mg | Freq: Four times a day (QID) | INTRAMUSCULAR | Status: DC | PRN
  Administered 2020-11-27 (×2): 15 mg via INTRAVENOUS
  Filled 2020-11-27 (×2): qty 1

## 2020-11-27 MED ORDER — INSULIN ASPART 100 UNIT/ML ~~LOC~~ SOLN: 0.0000 [IU] | SUBCUTANEOUS | Status: DC

## 2020-11-27 MED ORDER — GUAIFENESIN ER 600 MG PO TB12
1200.0000 mg | ORAL_TABLET | Freq: Two times a day (BID) | ORAL | Status: DC
  Administered 2020-11-27 – 2020-11-30 (×6): 1200 mg via ORAL
  Filled 2020-11-27 (×6): qty 2

## 2020-11-27 MED FILL — Thrombin (Recombinant) For Soln 20000 Unit: CUTANEOUS | Qty: 1 | Status: AC

## 2020-11-27 MED FILL — Lidocaine HCl Local Soln Prefilled Syringe 100 MG/5ML (2%): INTRAMUSCULAR | Qty: 5 | Status: AC

## 2020-11-27 MED FILL — Heparin Sodium (Porcine) Inj 1000 Unit/ML: INTRAMUSCULAR | Qty: 20 | Status: AC

## 2020-11-27 MED FILL — Mannitol IV Soln 20%: INTRAVENOUS | Qty: 500 | Status: AC

## 2020-11-27 MED FILL — Sodium Bicarbonate IV Soln 8.4%: INTRAVENOUS | Qty: 50 | Status: AC

## 2020-11-27 MED FILL — Heparin Sodium (Porcine) Inj 1000 Unit/ML: INTRAMUSCULAR | Qty: 30 | Status: AC

## 2020-11-27 MED FILL — Sodium Chloride IV Soln 0.9%: INTRAVENOUS | Qty: 2000 | Status: AC

## 2020-11-27 MED FILL — Electrolyte-R (PH 7.4) Solution: INTRAVENOUS | Qty: 4000 | Status: AC

## 2020-11-27 MED FILL — Potassium Chloride Inj 2 mEq/ML: INTRAVENOUS | Qty: 40 | Status: AC

## 2020-11-27 MED FILL — Magnesium Sulfate Inj 50%: INTRAMUSCULAR | Qty: 10 | Status: AC

## 2020-11-27 NOTE — Progress Notes (Signed)
[facility] Evening Rounds  POD #2 s/p CABG Doing well; no complaints  BP (!) 137/94   Pulse 88   Temp 99.1 F (37.3 C) (Oral)   Resp (!) 23   Ht 6\' 1"  (1.854 m)   Wt 129.7 kg   SpO2 96%   BMI 37.73 kg/m  Alert/oriented CTA RRR   Intake/Output Summary (Last 24 hours) at 11/27/2020 1843 Last data filed at 11/27/2020 1800 Gross per 24 hour  Intake 3114.29 ml  Output 2115 ml  Net 999.29 ml    A/p: continue current management. Michiko Lineman Z. Orvan Seen, [place]

## 2020-11-27 NOTE — Plan of Care (Signed)
  Problem: Health Behavior/Discharge Planning: Goal: Ability to manage health-related needs will improve Outcome: Progressing   Problem: Clinical Measurements: Goal: Respiratory complications will improve Outcome: Progressing Goal: Cardiovascular complication will be avoided Outcome: Progressing   Problem: Activity: Goal: Risk for activity intolerance will decrease Outcome: Progressing   Problem: Coping: Goal: Level of anxiety will decrease Outcome: Progressing   Problem: Elimination: Goal: Will not experience complications related to bowel motility Outcome: Progressing   Problem: Skin Integrity: Goal: Risk for impaired skin integrity will decrease Outcome: Progressing   Problem: Education: Goal: Understanding of cardiac disease, CV risk reduction, and recovery process will improve Outcome: Progressing   Problem: Activity: Goal: Ability to tolerate increased activity will improve Outcome: Progressing   Problem: Cardiac: Goal: Ability to achieve and maintain adequate cardiopulmonary perfusion will improve Outcome: Progressing   Problem: Health Behavior/Discharge Planning: Goal: Ability to safely manage health-related needs after discharge will improve Outcome: Progressing   Problem: Education: Goal: Will demonstrate proper wound care and an understanding of methods to prevent future damage Outcome: Progressing Goal: Knowledge of disease or condition will improve Outcome: Progressing Goal: Knowledge of the prescribed therapeutic regimen will improve Outcome: Progressing Goal: Individualized Educational Video(s) Outcome: Progressing   Problem: Activity: Goal: Risk for activity intolerance will decrease Outcome: Progressing   Problem: Cardiac: Goal: Will achieve and/or maintain hemodynamic stability Outcome: Progressing   Problem: Clinical Measurements: Goal: Postoperative complications will be avoided or minimized Outcome: Progressing   Problem:  Respiratory: Goal: Respiratory status will improve Outcome: Progressing   Problem: Skin Integrity: Goal: Wound healing without signs and symptoms of infection Outcome: Progressing Goal: Risk for impaired skin integrity will decrease Outcome: Progressing   Problem: Urinary Elimination: Goal: Ability to achieve and maintain adequate renal perfusion and functioning will improve Outcome: Progressing

## 2020-11-27 NOTE — Procedures (Signed)
Extubation Procedure Note  Patient Details:   Name: Ryan Clarke DOB: November 12, 1956 MRN: [id number]   Airway Documentation:    Vent end date: 11/27/20 Vent end time: 0845   Evaluation  O2 sats: stable throughout Complications: No apparent complications Patient did tolerate procedure well. Bilateral Breath Sounds: Clear,Diminished   Yes   RT extubated patient to 5L [place] per MD order/rapid wean protocol with RN at bedside. Positive cuff leak noted. NIF -30 and VC 1270. Patient tolerated well and no stridor noted at this time. RT will continue to monitor as needed.   Fabiola Backer 11/27/2020, 8:51 AM

## 2020-11-27 NOTE — Progress Notes (Signed)
RT and RN have tried several times to decrease his FI02 to weaning status.  Patients sats drop in low 90's each time.   ABG was obtained and revealed a P02 of 58.   Patient is alert and follows commands.

## 2020-11-27 NOTE — Anesthesia Postprocedure Evaluation (Signed)
Anesthesia Post Note  Patient: Kayl Stogdill  Procedure(s) Performed: CORONARY ARTERY BYPASS GRAFTING (CABG) TIMES TWO USING BILATERAL INTERNAL MAMMARY ARTERIES (N/A Chest) TRANSESOPHAGEAL ECHOCARDIOGRAM (TEE) (N/A )     Patient location during evaluation: SICU Anesthesia Type: General Level of consciousness: sedated Pain management: pain level controlled Vital Signs Assessment: post-procedure vital signs reviewed and stable Respiratory status: patient remains intubated per anesthesia plan Cardiovascular status: stable Postop Assessment: no apparent nausea or vomiting Anesthetic complications: no   No complications documented.  Last Vitals:  Vitals:   11/27/20 0810 11/27/20 0850  BP:  115/68  Pulse:  88  Resp:  12  Temp:  37.7 C  SpO2: 94% 91%    Last Pain:  Vitals:   11/27/20 0800  TempSrc: Core  PainSc:                  Kingslee Mairena S

## 2020-11-27 NOTE — Progress Notes (Signed)
1 Day Post-Op Procedure(s) (LRB): CORONARY ARTERY BYPASS GRAFTING (CABG) TIMES TWO USING BILATERAL INTERNAL MAMMARY ARTERIES (N/A) TRANSESOPHAGEAL ECHOCARDIOGRAM (TEE) (N/A) Subjective: Extubated a little while ago. No complaints.   Objective: Vital signs in last 24 hours: Temp:  [95.18 F (35.1 C)-101.12 F (38.4 C)] 99.86 F (37.7 C) (01/11 0850) Pulse Rate:  [76-103] 88 (01/11 0850) Cardiac Rhythm: Atrial fibrillation (01/11 0800) Resp:  [8-31] 12 (01/11 0850) BP: (84-125)/(61-70) 115/68 (01/11 0850) SpO2:  [91 %-98 %] 91 % (01/11 0850) Arterial Line BP: (77-154)/(44-95) 98/58 (01/11 0800) FiO2 (%):  [40 %-100 %] 40 % (01/11 0810)  Hemodynamic parameters for last 24 hours: PAP: (24-40)/(14-26) 29/17 CVP:  [8 mmHg-14 mmHg] 8 mmHg CO:  [4 L/min-5.1 L/min] 4.7 L/min CI:  [1.6 L/min/m2-2.1 L/min/m2] 1.9 L/min/m2  Intake/Output from previous day: 01/10 0701 - 01/11 0700 In: 6522.2 [I.V.:4376.3; Blood:975; IV Piggyback:1112.4] Out: 6073 [Urine:2130; Blood:1400; Chest Tube:538] Intake/Output this shift: Total I/O In: 756.4 [I.V.:256.4; IV Piggyback:500] Out: -   General appearance: alert and cooperative Neurologic: intact Heart: irregularly irregular rhythm Lungs: clear to auscultation bilaterally Extremities: edema mild Wound: dressing dry  Lab Results: Recent Labs    11/26/20 2013 11/27/20 0058 11/27/20 0412  WBC 13.0*  --  11.1*  HGB 14.4 13.9 14.0  HCT 43.1 41.0 41.8  PLT 165  --  150   BMET:  Recent Labs    11/26/20 2013 11/27/20 0058 11/27/20 0412  NA 134* 138 136  K 4.3 4.4 4.0  CL 104  --  105  CO2 21*  --  22  GLUCOSE 128*  --  124*  BUN 10  --  9  CREATININE 0.80  --  0.77  CALCIUM 8.3*  --  8.4*    PT/INR:  Recent Labs    11/26/20 1410  LABPROT 16.2*  INR 1.4*   ABG    Component Value Date/Time   PHART 7.440 11/27/2020 0058   HCO3 22.5 11/27/2020 0058   TCO2 23 11/27/2020 0058   ACIDBASEDEF 1.0 11/27/2020 0058   O2SAT 91.0  11/27/2020 0058   CBG (last 3)  Recent Labs    11/27/20 0523 11/27/20 0622 11/27/20 0735  GLUCAP 116* 115* 120*   CXR: low lung volumes with bibasilar atelectasis  ECG: atrial fib with RBBB.  Assessment/Plan: S/P Procedure(s) (LRB): CORONARY ARTERY BYPASS GRAFTING (CABG) TIMES TWO USING BILATERAL INTERNAL MAMMARY ARTERIES (N/A) TRANSESOPHAGEAL ECHOCARDIOGRAM (TEE) (N/A)  POD 1 Hemodynamically stable. Wean neo off as tolerated. Hold BB until off neo.  Postop atrial fib: Continue IV amio.  DC chest tubes, swan, arterial line.  OOB, IS, mobilize.  Glucose under good control. No hx of DM. Will check CBG's today.    LOS: 9 days    Gaye Pollack 11/27/2020

## 2020-11-28 ENCOUNTER — Inpatient Hospital Stay (HOSPITAL_COMMUNITY): Payer: Medicare HMO

## 2020-11-28 LAB — BASIC METABOLIC PANEL
Anion gap: 10 (ref 5–15)
BUN: 9 mg/dL (ref 8–23)
CO2: 22 mmol/L (ref 22–32)
Calcium: 8.3 mg/dL — ABNORMAL LOW (ref 8.9–10.3)
Chloride: 104 mmol/L (ref 98–111)
Creatinine, Ser: 0.85 mg/dL (ref 0.61–1.24)
GFR, Estimated: >60 mL/min (ref >60)
Glucose, Bld: 127 mg/dL — ABNORMAL HIGH (ref 70–99)
Potassium: 3.7 mmol/L (ref 3.5–5.1)
Sodium: 136 mmol/L (ref 135–145)

## 2020-11-28 LAB — CBC
HCT: 41.2 % (ref 39.0–52.0)
Hemoglobin: 13.5 g/dL (ref 13.0–17.0)
MCH: 29.4 pg (ref 26.0–34.0)
MCHC: 32.8 g/dL (ref 30.0–36.0)
MCV: 89.8 fL (ref 80.0–100.0)
Platelets: 126 10*3/uL — ABNORMAL LOW (ref 150–400)
RBC: 4.59 MIL/uL (ref 4.22–5.81)
RDW: 12.6 % (ref 11.5–15.5)
WBC: 10.9 10*3/uL — ABNORMAL HIGH (ref 4.0–10.5)
nRBC: 0 % (ref 0.0–0.2)

## 2020-11-28 LAB — GLUCOSE, CAPILLARY
Glucose-Capillary: 119 mg/dL — ABNORMAL HIGH (ref 70–99)
Glucose-Capillary: 143 mg/dL — ABNORMAL HIGH (ref 70–99)

## 2020-11-28 MED ORDER — SIMETHICONE 80 MG PO CHEW: 80.0000 mg | CHEWABLE_TABLET | Freq: Four times a day (QID) | ORAL | Status: DC | PRN

## 2020-11-28 MED ORDER — ASPIRIN EC 325 MG PO TBEC: 325.0000 mg | DELAYED_RELEASE_TABLET | Freq: Every day | ORAL | Status: DC

## 2020-11-28 MED ORDER — FUROSEMIDE 40 MG PO TABS
40.0000 mg | ORAL_TABLET | Freq: Every day | ORAL | Status: AC
  Administered 2020-11-28 – 2020-11-29 (×2): 40 mg via ORAL
  Filled 2020-11-28 (×2): qty 1

## 2020-11-28 MED ORDER — METOPROLOL TARTRATE 25 MG PO TABS
25.0000 mg | ORAL_TABLET | Freq: Two times a day (BID) | ORAL | Status: DC
  Administered 2020-11-28 – 2020-11-30 (×5): 25 mg via ORAL
  Filled 2020-11-28 (×5): qty 1

## 2020-11-28 MED ORDER — ONDANSETRON HCL 4 MG/2ML IJ SOLN: 4.0000 mg | Freq: Four times a day (QID) | INTRAMUSCULAR | Status: DC | PRN

## 2020-11-28 MED ORDER — POTASSIUM CHLORIDE CRYS ER 20 MEQ PO TBCR
20.0000 meq | EXTENDED_RELEASE_TABLET | Freq: Three times a day (TID) | ORAL | Status: AC
  Administered 2020-11-28 (×3): 20 meq via ORAL
  Filled 2020-11-28 (×3): qty 1

## 2020-11-28 MED ORDER — SODIUM CHLORIDE 0.9 % IV SOLN: 250.0000 mL | INTRAVENOUS | Status: DC | PRN

## 2020-11-28 MED ORDER — SODIUM CHLORIDE 0.9% FLUSH: 3.0000 mL | INTRAVENOUS | Status: DC | PRN

## 2020-11-28 MED ORDER — TRAMADOL HCL 50 MG PO TABS
50.0000 mg | ORAL_TABLET | ORAL | Status: DC | PRN
  Administered 2020-11-28: 50 mg via ORAL
  Filled 2020-11-28: qty 1

## 2020-11-28 MED ORDER — AMIODARONE HCL 200 MG PO TABS
200.0000 mg | ORAL_TABLET | Freq: Two times a day (BID) | ORAL | Status: DC
  Administered 2020-11-28 – 2020-11-30 (×5): 200 mg via ORAL
  Filled 2020-11-28 (×5): qty 1

## 2020-11-28 MED ORDER — SENNOSIDES-DOCUSATE SODIUM 8.6-50 MG PO TABS: 1.0000 | ORAL_TABLET | Freq: Two times a day (BID) | ORAL | Status: DC | PRN

## 2020-11-28 MED ORDER — POTASSIUM CHLORIDE CRYS ER 20 MEQ PO TBCR: 20.0000 meq | EXTENDED_RELEASE_TABLET | Freq: Every day | ORAL | Status: DC

## 2020-11-28 MED ORDER — ONDANSETRON HCL 4 MG PO TABS
4.0000 mg | ORAL_TABLET | Freq: Four times a day (QID) | ORAL | Status: DC | PRN
  Administered 2020-11-28: 4 mg via ORAL
  Filled 2020-11-28: qty 1

## 2020-11-28 MED ORDER — ~~LOC~~ CARDIAC SURGERY, PATIENT & FAMILY EDUCATION: Freq: Once | Status: AC

## 2020-11-28 MED ORDER — SODIUM CHLORIDE 0.9% FLUSH
3.0000 mL | Freq: Two times a day (BID) | INTRAVENOUS | Status: DC
  Administered 2020-11-28 – 2020-11-29 (×4): 3 mL via INTRAVENOUS

## 2020-11-28 MED ORDER — PANTOPRAZOLE SODIUM 40 MG PO TBEC
40.0000 mg | DELAYED_RELEASE_TABLET | Freq: Every day | ORAL | Status: DC
  Administered 2020-11-29 – 2020-11-30 (×2): 40 mg via ORAL
  Filled 2020-11-28 (×2): qty 1

## 2020-11-28 MED ORDER — ACETAMINOPHEN 325 MG PO TABS
650.0000 mg | ORAL_TABLET | Freq: Four times a day (QID) | ORAL | Status: DC | PRN
  Administered 2020-11-28 – 2020-11-30 (×2): 650 mg via ORAL
  Filled 2020-11-28 (×2): qty 2

## 2020-11-28 MED ORDER — OXYCODONE HCL 5 MG PO TABS
5.0000 mg | ORAL_TABLET | ORAL | Status: DC | PRN
  Administered 2020-11-28 – 2020-11-30 (×5): 10 mg via ORAL
  Filled 2020-11-28 (×5): qty 2

## 2020-11-28 NOTE — Clinical Note (Incomplete)
CC: chest discomfort  HPI: 65 yo obese male hx significant for CAD s/p 30 stents, PAF, and HTN presents with unstable angina 1/3 PMH: HTN, CAD PTA Meds: bASA 81, atorv 80, toprolol, prasugrel  ALL: NKDA FH: CAD [multiple family member died in 71s after CABG] SH: none  Problem 1 - Severe multi-vessel CAD Assessment:  cath [1/3] LAD 80% stenosis, Echo 55-60%, TEE CABG x2 LEB [1/10], CHADSVASC 2, extubated on [place]  Lovenox 40 1/11>  Hgb 13.6, plt 150 Zinacef + Vanc surgical ppx 1/10> 1/11  WBC 11.7 > 10.9 (dwn trnd) 1/12> afebrile   PCR MRSA: neg amio gtt D/c'd> po 1/12>  PFT wnl  Scr 0.79> 0.85  Problem 2 - normotensive HTN  Assessment: amlod 1/6>1/10, Imdur 1/3> 1/10  PTA: bASA 1/6> 1/10 , Atorv 1/3> ,  toprolol 1/3> 1/11 - lopressor 1/12> ASA 1/10> -Potassium 20 tid 1/12>  K: 3.8> 3.7 Plan:    Best Practice:  1. NPH , BG stable  2. PPI ppx: panto 1/3 > 1/10

## 2020-11-28 NOTE — Progress Notes (Signed)
2 Days Post-Op Procedure(s) (LRB): CORONARY ARTERY BYPASS GRAFTING (CABG) TIMES TWO USING BILATERAL INTERNAL MAMMARY ARTERIES (N/A) TRANSESOPHAGEAL ECHOCARDIOGRAM (TEE) (N/A) Subjective: No complaints. Ambulated this am.  Converted to sinus last night.  Objective: Vital signs in last 24 hours: Temp:  [98.2 F (36.8 C)-99.86 F (37.7 C)] 98.7 F (37.1 C) (01/12 0357) Pulse Rate:  [76-107] 80 (01/12 0200) Cardiac Rhythm: Normal sinus rhythm (01/12 0000) Resp:  [4-36] 14 (01/12 0200) BP: (109-149)/(68-94) 132/82 (01/12 0200) SpO2:  [90 %-97 %] 97 % (01/12 0200) Arterial Line BP: (86-142)/(49-77) 127/64 (01/12 0200) FiO2 (%):  [40 %] 40 % (01/11 0810) Weight:  [133.9 kg] (P) 133.9 kg (01/12 0500)  Hemodynamic parameters for last 24 hours: PAP: (26-29)/(10-17) 28/10  Intake/Output from previous day: 01/11 0701 - 01/12 0700 In: 1515.4 [I.V.:965.4; IV Piggyback:550] Out: 1910 [Urine:1720; Chest Tube:190] Intake/Output this shift: No intake/output data recorded.  General appearance: alert and cooperative Neurologic: intact Heart: regular rate and rhythm, S1, S2 normal, no murmur, click, rub or gallop Lungs: clear to auscultation bilaterally Extremities: edema minimal Wound: dressing dry  Lab Results: Recent Labs    11/27/20 1613 11/28/20 0417  WBC 13.5* 10.9*  HGB 14.2 13.5  HCT 41.7 41.2  PLT 160 126*   BMET:  Recent Labs    11/27/20 1613 11/28/20 0417  NA 136 136  K 3.8 3.7  CL 106 104  CO2 21* 22  GLUCOSE 125* 127*  BUN 10 9  CREATININE 0.79 0.85  CALCIUM 8.0* 8.3*    PT/INR:  Recent Labs    11/26/20 1410  LABPROT 16.2*  INR 1.4*   ABG    Component Value Date/Time   PHART 7.400 11/27/2020 1014   HCO3 22.5 11/27/2020 1014   TCO2 24 11/27/2020 1014   ACIDBASEDEF 2.0 11/27/2020 1014   O2SAT 93.0 11/27/2020 1014   CBG (last 3)  Recent Labs    11/27/20 2014 11/27/20 2355 11/28/20 0349  GLUCAP 114* 147* 119*   CXR: improved aeration. Mild  atelectasis  Assessment/Plan: S/P Procedure(s) (LRB): CORONARY ARTERY BYPASS GRAFTING (CABG) TIMES TWO USING BILATERAL INTERNAL MAMMARY ARTERIES (N/A) TRANSESOPHAGEAL ECHOCARDIOGRAM (TEE) (N/A)  POD 2  Hemodynamically stable in sinus rhythm. Will start Lopressor.  Postop atrial fibrillation converted to sinus on IV amio. Switch to po.  DC pacing wires today and resume Effient tomorrow for multiple stents.  Volume excess: wt decreasing and only 2.5 lbs over preop. Continue diuresis and KCL.  IS, ambulation  Transfer to 4E.   LOS: 10 days    Gaye Pollack 11/28/2020

## 2020-11-28 NOTE — Progress Notes (Signed)
Patient arrived to the floor approximately 1815 accompanied by RN from [facility] and his wife. He is alert and oriented x4. Chest tubes removed yesterday and pacing wires removed today. No complications. VS are all WNL. Sternal incision is covered with guaze, no drainage noted. Currently sitting up in chair with his wife in no acute distress. Fuller [place], RN

## 2020-11-28 NOTE — Discharge Instructions (Signed)
[facility] office [phone]  Coronary Artery Bypass Grafting, Care After This sheet gives you information about how to care for yourself after your procedure. Your doctor may also give you more specific instructions. If you have problems or questions, call your doctor. What can I expect after the procedure? After the procedure, it is common to:  Feel sick to your stomach (nauseous).  Not want to eat as much as normal (lack of appetite).  Have trouble pooping (constipation).  Have weakness and tiredness (fatigue).  Feel sad (depressed) or grouchy (irritable).  Have pain or discomfort around the cuts from surgery (incisions). Follow these instructions at home: Medicines  Take over-the-counter and prescription medicines only as told by your doctor. Do not stop taking medicines or start any new medicines unless your doctor says it is okay.  If you were prescribed an antibiotic medicine, take it as told by your doctor. Do not stop taking the antibiotic even if you start to feel better. Incision care  Follow instructions from your doctor about how to take care of your cuts from surgery. Make sure you: ? Wash your hands with soap and water before and after you change your bandage (dressing). If you cannot use soap and water, use hand sanitizer. ? Change your bandage as told by your doctor. ? Leave stitches (sutures), skin glue, or skin tape (adhesive) strips in place. They may need to stay in place for 2 weeks or longer. If tape strips get loose and curl up, you may trim the loose edges. Do not remove tape strips completely unless your doctor says it is okay.  Make sure the surgery cuts are clean, dry, and protected.  Check your cut areas every day for signs of infection. Check for: ? More redness, swelling, or pain. ? More fluid or blood. ? Warmth. ? Pus or a bad smell.  If cuts were made in your legs: ? Avoid crossing your legs. ? Avoid sitting for long periods of time. Change  positions every 30 minutes. ? Raise (elevate) your legs when you are sitting.   Bathing  Do not take baths, swim, or use a hot tub until your doctor says it is okay.  You may shower. Pat the surgery cuts dry. Do not rub the cuts to dry.  Eating and drinking  Eat foods that are high in fiber, such as beans, nuts, whole grains, and raw fruits and vegetables. Any meats you eat should be lean cut. Avoid canned, processed, and fried foods. This can help prevent trouble pooping. This is also a part of a heart-healthy diet.  Drink enough fluid to keep your pee (urine) pale yellow.  Do not drink alcohol until you are fully recovered. Ask your doctor when it is safe to drink alcohol.   Activity  Rest and limit your activity as told by your doctor. You may be told to: ? Stop any activity right away if you have chest pain, shortness of breath, irregular heartbeats, or dizziness. Get help right away if you have any of these symptoms. ? Move around often for short periods or take short walks as told by your doctor. Slowly increase your activities. ? Avoid lifting, pushing, or pulling anything that is heavier than 10 lb (4.5 kg) for at least 6 weeks or as told by your doctor.  Do physical therapy or a cardiac rehab (cardiac rehabilitation) program as told by your doctor. ? Physical therapy involves doing exercises to maintain movement and build strength and endurance. ?  A cardiac rehab program includes:  Exercise training.  Education.  Counseling.  Do not drive until your doctor says it is okay.  Ask your doctor when you can go back to work.  Ask your doctor when you can be sexually active. General instructions  Do not drive or use heavy machinery while taking prescription pain medicine.  Do not use any products that contain nicotine or tobacco. These include cigarettes, e-cigarettes, and chewing tobacco. If you need help quitting, ask your doctor.  Take 2-3 deep breaths every few hours  during the day while you get better. This helps expand your lungs and prevent problems.  If you were given a device called an incentive spirometer, use it several times a day to practice deep breathing. Support your chest with a pillow or your arms when you take deep breaths or cough.  Wear compression stockings as told by your doctor.  Weigh yourself every day. This helps to see if your body is holding (retaining) fluid that may make your heart and lungs work harder.  Keep all follow-up visits as told by your doctor. This is important. Contact a doctor if:  You have more redness, swelling, or pain around any cut.  You have more fluid or blood coming from any cut.  Any cut feels warm to the touch.  You have pus or a bad smell coming from any cut.  You have a fever.  You have swelling in your ankles or legs.  You have pain in your legs.  You gain 2 lb (0.9 kg) or more a day.  You feel sick to your stomach or you throw up (vomit).  You have watery poop (diarrhea). Get help right away if:  You have chest pain that goes to your jaw or arms.  You are short of breath.  You have a fast or irregular heartbeat.  You notice a "clicking" in your breastbone (sternum) when you move.  You have any signs of a stroke. "BE FAST" is an easy way to remember the main warning signs: ? B - Balance. Signs are dizziness, sudden trouble walking, or loss of balance. ? E - Eyes. Signs are trouble seeing or a change in how you see. ? F - Face. Signs are sudden weakness or loss of feeling of the face, or the face or eyelid drooping on one side. ? A - Arms. Signs are weakness or loss of feeling in an arm. This happens suddenly and usually on one side of the body. ? S - Speech. Signs are sudden trouble speaking, slurred speech, or trouble understanding what people say. ? T - Time. Time to call emergency services. Write down what time symptoms started.  You have other signs of a stroke, such as: ? A  sudden, very bad headache with no known cause. ? Feeling sick to your stomach. ? Throwing up. ? Jerky movements you cannot control (seizure). These symptoms may be an emergency. Do not wait to see if the symptoms will go away. Get medical help right away. Call your local emergency services (911 in the [place] Do not drive yourself to the hospital. Summary  After the procedure, it is common to have pain or discomfort in the cuts from surgery (incisions).  Do not take baths, swim, or use a hot tub until your doctor says it is okay.  Slowly increase your activities. You may need physical therapy or cardiac rehab.  Weigh yourself every day. This helps to see if your body is  holding fluid. This information is not intended to replace advice given to you by your health care provider. Make sure you discuss any questions you have with your health care provider. Document Revised: 07/13/2018 Document Reviewed: 07/13/2018 Elsevier Patient Education  2021 [employer].

## 2020-11-28 NOTE — Progress Notes (Signed)
[facility] BRIEF SICU PROGRESS NOTE  2 Days Post-Op  S/P Procedure(s) (LRB): CORONARY ARTERY BYPASS GRAFTING (CABG) TIMES TWO USING BILATERAL INTERNAL MAMMARY ARTERIES (N/A) TRANSESOPHAGEAL ECHOCARDIOGRAM (TEE) (N/A)   Stable day Maintaining NSR w/ stable BP Breathing comfortably on room air  Plan: Continue current plan  Rexene Alberts, MD 11/28/2020 5:15 PM

## 2020-11-28 NOTE — Progress Notes (Signed)
CARDIAC REHAB PHASE I   PRE:  Rate/Rhythm: 87 SR    BP: sitting 122/76    SaO2: 92 RA  MODE:  Ambulation: 740 ft   POST:  Rate/Rhythm: 96 SR    BP: sitting 118/75     SaO2: 93 RA  Pt up in recliner. Needed assist to stand. Used EVA and able to walk without assist, increased distance. Felt well, thankful to walk. HR stable. Encouraged IS and another walk later tonight. [place], ACSM 11/28/2020 2:51 PM

## 2020-11-28 NOTE — Discharge Summary (Signed)
Physician Discharge Summary  Patient ID: Ryan Clarke MRN: [id number] DOB/AGE: 1956-02-16 65 y.o.  Admit date: 11/16/2020 Discharge date: 11/30/2020    [facility] Cardiologist: Buford Dresser  Admission Diagnoses: Acute coronary syndrome  Discharge Diagnoses:  Principal Problem:   ACS (acute coronary syndrome) [facility]) Active Problems:   Paroxysmal atrial fibrillation (HCC)   Chest discomfort   Chest pain   S/P CABG x 2  Patient Active Problem List   Diagnosis Date Noted  . S/P CABG x 2 11/26/2020  . ACS (acute coronary syndrome) ([place]) 11/18/2020  . Chest pain   . Chest discomfort 11/17/2020  . Paroxysmal atrial fibrillation ([place]) 09/04/2020  . Secondary hypercoagulable state ([place]) 09/04/2020  . Pacemaker - STJ 08/16/2020  . CAD (coronary artery disease) 08/15/2020  . Hypertension 08/15/2020    Ryan Clarke is an 65 y.o. male.  HPI:   The patient is a 65 year old gentleman with a history of hypertension, strong family history of premature coronary artery disease, and coronary artery disease himself having had multiple previous PCI's with history of previous stents.  His most recent PCI was on 09/14/2020 when he presented with thrombus in the left circumflex stent immediately after the takeoff of the stented obtuse marginal branch with TIMI 0-1 flow down the distal left circumflex.  This was reportedly a difficult intervention requiring PTCA of both the OM ostium as well as a left circumflex stent with a cutting balloon intervention the left circumflex stent due to persistent intimal hyperplasia not resolved with noncompliant balloon dilatation.  He was continued on DAPT using aspirin and Effient.  He said that he did well until New Year's eve.  He was walking around a lot that day and developed some epigastric and lower chest discomfort that he thought was indigestion from eating too much.  This progressed into substernal chest discomfort radiating into the right upper  chest and shoulder which she had never had before and was fairly severe.  It did not resolve and prompted evaluation in the emergency room.  His troponin was 93 followed by 106 and an 122.  He had no symptoms after admission.  Cardiac catheterization yesterday showed severe multivessel coronary disease with progressive 80% ostial LAD stenosis immediately proximal to the proximal stent.  There was diffuse 60% narrowing in the proximal and mid LAD stented segment.  There is focal eccentric 80% restenosis within the left circumflex stent immediately after the takeoff of the marginal vessel.  There is 30% ostial narrowing in the marginal vessel which is stented ostially to proximally.  The RCA is diffusely centered from the ostium to the takeoff of the posterior lateral branch and has patent stents with mild luminal irregularity.  Left ventricular contractility is preserved with a LV EDP of 18 mmHg.  2D echocardiogram showed an ejection fraction of 55 to 60% with no significant valvular patient was referred to Dr. Cyndia Bent in cardiothoracic surgical consultation for consideration of surgical revascularization.  The patient and all relevant studies were reviewed by Dr. Cyndia Bent and recommendations were for proceeding with CABG.   Discharged Condition: good  Hospital Course: Following catheterization the patient medically stabilized for planned surgical intervention.  On 11/26/2020 he was taken to the operating room where he underwent the below described procedure.  He tolerated well and was taken to the surgical intensive care unit in a stable condition.  Postoperative hospital course:  The patient has remained hemodynamically stable but has had postoperative atrial fibrillation.  He has subsequently been chemically cardioverted with  intravenous amiodarone and transition to orals.  On routine lines, monitors and drainage devices have been discontinued in the standard fashion.  He does have a moderate postoperative  volume overload but is responding well to diuretics.  He will be kept on Lasix for an additional week post discharge.  He does not have a postoperative anemia.  Renal function has remained within normal limits.  Oxygen has been weaned and he maintains adequate saturations on room air.  Incisions are noted to be healing well without evidence of infection.  He is tolerating diet.  He is tolerating routine ambulation using standard cardiac rehab protocol.  Overall at the time of discharge the patient was felt to be stable.  Consults: cardiology  Significant Diagnostic Studies: angiography: cardiac cath LEFT HEART CATH AND CORONARY ANGIOGRAPHY    Conclusion    1st Mrg lesion is 40% stenosed.  Prox Cx to Mid Cx lesion is 80% stenosed.  Non-stenotic RPAV lesion was previously treated.  Ost RCA to Dist RCA lesion is 5% stenosed.  Dist LM to Ost LAD lesion is 80% stenosed.  Mid LAD lesion is 60% stenosed with 60% stenosed side branch in 1st Sept.  Previously placed 2nd Diag stent (unknown type) is widely patent.   Multivessel CAD with stents in the LAD, diagonal, circumflex, circumflex marginal, and RCA extending to the PLA takeoff.  The LAD has progressive 80% ostial stenosis which seems to occur immediately proximal to the proximal stent.  There is diffuse 60% narrowing in the proximal and mid stented segment.  The diagonal stent is patent.  The distal portion of the mid LAD stent is patent.  Left circumflex stent extending from the ostium to the mid AV groove circumflex with focal eccentric 80% restenosis in the mid circumflex immediately after the takeoff of the marginal vessel.  There is 30 to 40% ostial narrowing in th marginal vessel which is stented ostially to proximally.  The RCA is diffusely stented from the ostium to the takeoff of the PLA vessel.  There is mild luminal irregularity.  Preserved global LV contractility with EF estimate at 18 mmHg.  RECOMMENDATION: The  angiographic findings were reviewed with Dr. Gwenlyn Found in the catheterization laboratory.  With the ostial progression of 80% in the LAD as well as again restenosis in the circumflex vessel recommend surgical consultation for optimal long-term benefit.  We will hold prasugrel.  Increase anti-ischemic medications, optimal lipid management and BP control.   Recommendations  Antiplatelet/Anticoag Will hold prasugrel and obtain surgical consultation for CABG revascularization.   Surgeon Notes    11/26/2020 3:54 PM Op Note signed by Gaye Pollack, MD    11/26/2020 1:57 PM Operative Note - Scan signed by Default, Provider, MD    Indications  Unstable angina ([place]) [I20.0 (ICD-10-CM)]   Procedural Details  Technical Details Mr. Frazier Balfour is a 65 year old gentleman who is originally from [place].  Over the last 26 years he has undergone multiple coronary interventions and has a total of 29 coronary stents placed on [place], [place] with his initial stent inserted at age 41 he had recently moved to [place].  He has a history of atrial fibrillation.  He is status post permanent pacemaker insertion in 2019.  He was admitted in October 2021 with unstable angina and at that time was found to have bifurcation stenosis of the left circumflex and OM vessel and underwent complex coronary intervention requiring kissing balloon technique initial plaque shifting but ultimately successful.  He  also had 80% ostial LAD disease.  Subsequent to his October catheterization, he he felt well.  Over the past several weeks he has noticed some mild recurrent chest pain but on New Year's Eve chest pain symptoms became significantly worse prompting his hospitalization.  Troponins are mildly positive at 123.  He has been on heparin therapy.  He is referred for definitive repeat cardiac catheterization.  The patient was brought to the cardiac catheterization lab in the fasting state. The patient was premedicated  with Versed 2 mg and fentanyl 50 mcg.ultrasound guidance was used for right radial access.  The right radial artery was punctured via the Seldinger technique, and a 6 [place] Glidesheath Slender was inserted.  A radial cocktail consisting of Verapamil 3 mg was administered. The patient received 6500 units of weight adjusted heparin. A safety J wire was advanced into the ascending aorta. Diagnostic catheterization was done with a 5 [place] TIG 4.0 catheter.  Hand injection left ventriculography was performed.  Angiograms were reviewed with Dr. Gwenlyn Found in the laboratory.  A TR radial band was applied for hemostasis. The patient left the catheterization laboratory in stable condition.   Estimated blood loss <50 mL.   During this procedure medications were administered to achieve and maintain moderate conscious sedation while the patient's heart rate, blood pressure, and oxygen saturation were continuously monitored and I was present face-to-face 100% of this time.   Medications (Filter: Administrations occurring from 1334 to 1502 on 11/19/20) (important) Continuous medications are totaled by the amount administered until 11/19/20 1502.    fentaNYL (SUBLIMAZE) injection (mcg) Total dose:  75 mcg  Date/Time Rate/Dose/Volume Action   11/19/20 1344 50 mcg Given   1443 25 mcg Given    midazolam (VERSED) injection (mg) Total dose:  3 mg  Date/Time Rate/Dose/Volume Action   11/19/20 1344 2 mg Given   1443 1 mg Given    lidocaine (PF) (XYLOCAINE) 1 % injection (mL) Total volume:  8 mL  Date/Time Rate/Dose/Volume Action   11/19/20 1401 8 mL Given    Radial Cocktail/Verapamil only (mL) Total volume:  10 mL  Date/Time Rate/Dose/Volume Action   11/19/20 1424 10 mL Given    heparin sodium (porcine) injection (Units) Total dose:  6,500 Units  Date/Time Rate/Dose/Volume Action   11/19/20 1427 6,500 Units Given    iohexol (OMNIPAQUE) 350 MG/ML injection (mL) Total volume:  80 mL  Date/Time  Rate/Dose/Volume Action   11/19/20 1453 80 mL Given    Heparin (Porcine) in NaCl 1000-0.9 UT/500ML-% SOLN (mL) Total volume:  500 mL  Date/Time Rate/Dose/Volume Action   11/19/20 1455 500 mL Given    Heparin (Porcine) in NaCl 1000-0.9 UT/500ML-% SOLN (mL) Total volume:  500 mL  Date/Time Rate/Dose/Volume Action   11/19/20 1455 500 mL Given    atorvastatin (LIPITOR) tablet 80 mg (mg) Total dose:  Cannot be calculated*  *Administration dose not documented Date/Time Rate/Dose/Volume Action   11/19/20 1334 *Not included in total MAR Hold    aspirin EC tablet 81 mg (mg) Total dose:  Cannot be calculated*  *Administration dose not documented Date/Time Rate/Dose/Volume Action   11/19/20 1334 *Not included in total MAR Hold    metoprolol succinate (TOPROL-XL) 24 hr tablet 25 mg (mg) Total dose:  Cannot be calculated*  *Administration dose not documented Date/Time Rate/Dose/Volume Action   11/19/20 1334 *Not included in total MAR Hold    pantoprazole (PROTONIX) EC tablet 40 mg (mg) Total dose:  Cannot be calculated*  *Administration dose not documented Date/Time Rate/Dose/Volume  Action   11/19/20 1334 *Not included in total MAR Hold    prasugrel (EFFIENT) tablet 10 mg (mg) Total dose:  Cannot be calculated*  *Administration dose not documented Date/Time Rate/Dose/Volume Action   11/19/20 1334 *Not included in total MAR Hold    sodium chloride flush (NS) 0.9 % injection 3 mL (mL) Total dose:  Cannot be calculated* Dosing weight:  128.6  *Administration dose not documented Date/Time Rate/Dose/Volume Action   11/19/20 1334 *Not included in total MAR Hold    Sedation Time  Sedation Time Physician-1: 1 hour 8 minutes 27 seconds   Contrast  Medication Name Total Dose  iohexol (OMNIPAQUE) 350 MG/ML injection 80 mL    Radiation/Fluoro  Fluoro time: 3.8 (min) DAP: [id number] (mGycm2) Cumulative Air Kerma: [id number] (mGy)   Coronary Findings   Diagnostic Dominance:  Right  Left Main  Dist LM to Ost LAD lesion is 80% stenosed. The lesion was previously treated.  Left Anterior Descending  Mid LAD lesion is 60% stenosed with 60% stenosed side branch in 1st Sept. The lesion was previously treated.  First Diagonal Branch  Vessel is small in size.  Second Diagonal Branch  Previously placed 2nd Diag stent (unknown type) is widely patent.  Left Circumflex  Prox Cx to Mid Cx lesion is 80% stenosed. The lesion was previously treated.  First Obtuse Marginal Branch  1st Mrg lesion is 40% stenosed. The lesion was previously treated.  Right Coronary Artery  Ost RCA to Dist RCA lesion is 5% stenosed. The lesion was previously treated.  Right Posterior Atrioventricular Artery  Non-stenotic RPAV lesion was previously treated.   Intervention   No interventions have been documented.  Left Heart  Left Ventricle There is preserved global LV contractility with EF estimate 55%.  LVEDP was 18 mmHg.   Coronary Diagrams   Diagnostic Dominance: Right    Intervention    Treatments: surgery:  CARDIOVASCULAR SURGERY OPERATIVE NOTE  11/26/2020  Surgeon:  Gaye Pollack, MD  First Assistant: Jadene Pierini, PA-C   Preoperative Diagnosis:  Severe multi-vessel coronary artery disease   Postoperative Diagnosis:  Same   Procedure:  1. Median Sternotomy 2. Extracorporeal circulation 3.   Coronary artery bypass grafting x 2   Left internal mammary artery graft to the LAD  Free right internal mammary artery graft to the Diagonal 1    Anesthesia:  General Endotracheal   Discharge Exam: Blood pressure 114/83, pulse 80, temperature 98.1 F (36.7 C), temperature source Oral, resp. rate 17, height 6\' 1"  (1.854 m), weight 132.6 kg, SpO2 96 %.  General appearance: alert and cooperative Neurologic: intact Heart: regular rate and rhythm, S1, S2 normal, no murmur, click, rub or gallop Lungs: clear to auscultation  bilaterally Extremities: edema mild Wound: incision healing well.  Disposition: Discharge disposition: 01-Home or Self Care       Discharge Instructions    Amb Referral to Cardiac Rehabilitation   Complete by: As directed    Diagnosis: CABG   CABG X ___: 2   After initial evaluation and assessments completed: Virtual Based Care may be provided alone or in conjunction with Phase 2 Cardiac Rehab based on patient barriers.: Yes   Discharge patient   Complete by: As directed    Discharge disposition: 01-Home or Self Care   Discharge patient date: 11/30/2020     Allergies as of 11/30/2020   No Known Allergies     Medication List    STOP taking these medications   metoprolol succinate 25 MG  24 hr tablet Commonly known as: TOPROL-XL   nitroGLYCERIN 0.4 MG SL tablet Commonly known as: NITROSTAT     TAKE these medications   acetaminophen 325 MG tablet Commonly known as: TYLENOL Take 2 tablets (650 mg total) by mouth every 6 (six) hours as needed for mild pain.   amiodarone 200 MG tablet Commonly known as: PACERONE Take 1 tablet (200 mg total) by mouth 2 (two) times daily.   aspirin EC 81 MG tablet Take 81 mg by mouth daily. Swallow whole.   atorvastatin 80 MG tablet Commonly known as: LIPITOR Take 80 mg by mouth at bedtime.   famotidine 20 MG tablet Commonly known as: PEPCID TAKE ONE-HALF TABLET BY MOUTH 2 TIMES DAILY AS NEEDED FOR HEARTBURN OR INDIGESTION. What changed: See the new instructions.   furosemide 40 MG tablet Commonly known as: Lasix Take 1 tablet (40 mg total) by mouth daily.   metoprolol tartrate 25 MG tablet Commonly known as: LOPRESSOR Take 1 tablet (25 mg total) by mouth 2 (two) times daily.   potassium chloride SA 20 MEQ tablet Commonly known as: KLOR-CON Take 1 tablet (20 mEq total) by mouth daily.   prasugrel 10 MG Tabs tablet Commonly known as: EFFIENT Take 10 mg by mouth daily.   traMADol 50 MG tablet Commonly known as:  ULTRAM Take 1 tablet (50 mg total) by mouth every 6 (six) hours as needed for moderate pain.       Follow-up Information    Buford Dresser, MD. Go on 12/04/2020.   Specialty: Cardiology Why: @4pm  Contact information: [address] [place] [address] [phone]              The patient has been discharged on:   1.Beta Blocker:  Yes [  x ]                              No   [   ]                              If No, reason:  2.Ace Inhibitor/ARB: Yes [   ]                                     No  [  n  ]                                     If No, reason:labile   3.Statin:   Yes [ y  ]                  No  [   ]                  If No, reason:  4.Ecasa:  Yes  [ y  ]                  No   [   ]                  If No, reason:  Signed: John Giovanni PA-C 11/30/2020, 12:46 PM

## 2020-11-29 MED ORDER — ASPIRIN EC 81 MG PO TBEC
81.0000 mg | DELAYED_RELEASE_TABLET | Freq: Every day | ORAL | Status: DC
  Administered 2020-11-29 – 2020-11-30 (×2): 81 mg via ORAL
  Filled 2020-11-29 (×2): qty 1

## 2020-11-29 MED ORDER — POTASSIUM CHLORIDE CRYS ER 20 MEQ PO TBCR
20.0000 meq | EXTENDED_RELEASE_TABLET | Freq: Two times a day (BID) | ORAL | Status: DC
  Administered 2020-11-29 – 2020-11-30 (×3): 20 meq via ORAL
  Filled 2020-11-29 (×3): qty 1

## 2020-11-29 MED ORDER — METOLAZONE 5 MG PO TABS
2.5000 mg | ORAL_TABLET | Freq: Once | ORAL | Status: AC
  Administered 2020-11-29: 2.5 mg via ORAL
  Filled 2020-11-29: qty 1

## 2020-11-29 MED ORDER — PRASUGREL HCL 10 MG PO TABS
10.0000 mg | ORAL_TABLET | Freq: Every day | ORAL | Status: DC
  Administered 2020-11-29 – 2020-11-30 (×2): 10 mg via ORAL
  Filled 2020-11-29 (×2): qty 1

## 2020-11-29 NOTE — Progress Notes (Signed)
3 Days Post-Op Procedure(s) (LRB): CORONARY ARTERY BYPASS GRAFTING (CABG) TIMES TWO USING BILATERAL INTERNAL MAMMARY ARTERIES (N/A) TRANSESOPHAGEAL ECHOCARDIOGRAM (TEE) (N/A) Subjective: Chest sore today but overall feels well.  Objective: Vital signs in last 24 hours: Temp:  [98.1 F (36.7 C)-98.7 F (37.1 C)] 98.7 F (37.1 C) (01/13 0322) Pulse Rate:  [73-93] 78 (01/13 0322) Cardiac Rhythm: Normal sinus rhythm;Heart block (01/13 0322) Resp:  [10-26] 18 (01/13 0322) BP: (105-143)/(59-91) 133/82 (01/13 0322) SpO2:  [85 %-95 %] 94 % (01/13 0322) Weight:  [133.5 kg] 133.5 kg (01/13 0322)  Hemodynamic parameters for last 24 hours:    Intake/Output from previous day: 01/12 0701 - 01/13 0700 In: 360 [P.O.:360] Out: 700 [Urine:700] Intake/Output this shift: No intake/output data recorded.  General appearance: alert and cooperative Neurologic: intact Heart: regular rate and rhythm, S1, S2 normal, no murmur Lungs: clear to auscultation bilaterally Extremities: edema minimal Wound: incision ok  Lab Results: Recent Labs    11/27/20 1613 11/28/20 0417  WBC 13.5* 10.9*  HGB 14.2 13.5  HCT 41.7 41.2  PLT 160 126*   BMET:  Recent Labs    11/27/20 1613 11/28/20 0417  NA 136 136  K 3.8 3.7  CL 106 104  CO2 21* 22  GLUCOSE 125* 127*  BUN 10 9  CREATININE 0.79 0.85  CALCIUM 8.0* 8.3*    PT/INR:  Recent Labs    11/26/20 1410  LABPROT 16.2*  INR 1.4*   ABG    Component Value Date/Time   PHART 7.400 11/27/2020 1014   HCO3 22.5 11/27/2020 1014   TCO2 24 11/27/2020 1014   ACIDBASEDEF 2.0 11/27/2020 1014   O2SAT 93.0 11/27/2020 1014   CBG (last 3)  Recent Labs    11/27/20 2355 11/28/20 0349 11/28/20 0815  GLUCAP 147* 119* 143*    Assessment/Plan: S/P Procedure(s) (LRB): CORONARY ARTERY BYPASS GRAFTING (CABG) TIMES TWO USING BILATERAL INTERNAL MAMMARY ARTERIES (N/A) TRANSESOPHAGEAL ECHOCARDIOGRAM (TEE) (N/A)  POD 3  Hemodynamically  stable.  Maintaining sinus rhythm or oral amio and Lopressor.  Volume excess: wt is coming down but still 8 lbs over preop. Continue diuresis.  Resume Effient today for multiple prior stents.  Continue IS, ambulation.   Possibly home tomorrow if no changes.   LOS: 11 days    Ryan Clarke 11/29/2020

## 2020-11-29 NOTE — Progress Notes (Signed)
CARDIAC REHAB PHASE I   Offered to walk with pt earlier, pt declined due to pain. Returned to again offer, pt continues to decline stating need for sleep. Pt placed on 2L [place] as he desats when lying flat. Sats rose from 84-92. Encouraged some rest, will return later as able to ambulate.  [id number] Rufina Falco, RN BSN 11/29/2020 11:33 AM

## 2020-11-29 NOTE — Progress Notes (Signed)
Mobility Specialist - Progress Note   11/29/20 1626  Mobility  Activity Ambulated in hall  Level of Assistance Standby assist, set-up cues, supervision of patient - no hands on  Assistive Device None  Distance Ambulated (ft) 750 ft  Mobility Response Tolerated well  Mobility performed by Mobility specialist  $Mobility charge 1 Mobility    Pre-mobility: 90 HR During mobility: 107 HR Post-mobility: 90 HR  Asx throughout ambulation. Pt back in recliner after walk.   Pricilla Handler Mobility Specialist Mobility Specialist Phone: [phone]

## 2020-11-29 NOTE — Progress Notes (Signed)
CARDIAC REHAB PHASE I   PRE:  Rate/Rhythm: 83 SR  BP:  Supine:   Sitting: 111/68  Standing:    SaO2: 95%RA  MODE:  Ambulation: 750 ft   POST:  Rate/Rhythm: 98 SR  BP:  Supine:   Sitting: 120/69  Standing:    SaO2: 93%RA 1330-1355 Pt walked 750 ft on RA with rolling walker with steady gait. Pt does not think he will need walker for home. Reinforced sternal precautions. Back to recliner after walk. Discussed CRP 2 and will send another referral to [place].   Graylon Good, RN BSN  11/29/2020 1:52 PM

## 2020-11-30 ENCOUNTER — Encounter: Payer: Self-pay | Admitting: Surgery

## 2020-11-30 MED ORDER — FUROSEMIDE 40 MG PO TABS: 40.0000 mg | ORAL_TABLET | Freq: Every day | ORAL | 0 refills | Status: DC

## 2020-11-30 MED ORDER — POTASSIUM CHLORIDE CRYS ER 20 MEQ PO TBCR: 20.0000 meq | EXTENDED_RELEASE_TABLET | Freq: Every day | ORAL | 0 refills | Status: DC

## 2020-11-30 MED ORDER — METOPROLOL TARTRATE 25 MG PO TABS: 25.0000 mg | ORAL_TABLET | Freq: Two times a day (BID) | ORAL | 1 refills | Status: DC

## 2020-11-30 MED ORDER — AMIODARONE HCL 200 MG PO TABS: 200.0000 mg | ORAL_TABLET | Freq: Two times a day (BID) | ORAL | 1 refills | Status: DC

## 2020-11-30 MED ORDER — TRAMADOL HCL 50 MG PO TABS: 50.0000 mg | ORAL_TABLET | Freq: Four times a day (QID) | ORAL | 0 refills | Status: DC | PRN

## 2020-11-30 MED ORDER — ACETAMINOPHEN 325 MG PO TABS: 650.0000 mg | ORAL_TABLET | Freq: Four times a day (QID) | ORAL | Status: DC | PRN

## 2020-11-30 NOTE — Progress Notes (Signed)
Discharge orders received.  This RN in room to assist with discharge progression.  Pt sitting in recliner, resting state SpO2 89% on RA, good pleth.  Encouraged pt to take some deep breaths and O2 up to 94%.  Back down to 89% when breathing normally.  Jadene Pierini, [place] notified and he stated that this was discussed with Dr. Cyndia Bent, who felt comfortable sending patient home with  outpatient evaluation for OSA. IS education and encouragement provided.  Will proceed with discharge.

## 2020-11-30 NOTE — Progress Notes (Signed)
Patient sleeping. O2 sats  On room air 85-86% applied o2 at 2 l/m [place] sats up to 93%

## 2020-11-30 NOTE — Progress Notes (Signed)
IV and telemetry removed, CCMD notified.  Discharge information reviewed with patient.  Questions addressed.  Pt expresses understanding of medications, follow up appts, and general home care/activity.  He has notified his daughter via text that he is being discharged, awaiting response. She is at work in [employer].  It will likely be later this afternoon before she is able to get here.  She will bring his clothes.

## 2020-11-30 NOTE — Progress Notes (Signed)
Discussed IS, sternal precautions, diet, exercise, and CRPII. Pt receptive. Will refer to [place]. [id number] Yves Dill CES, ACSM 9:23 AM 11/30/2020

## 2020-11-30 NOTE — Progress Notes (Signed)
[facility] will sign off.   Medication Recommendations:  Continue ASA 81mg  qd, Effient 10mg  qd, Amiodarone 200mg  BID and Lipitor 80mg  qd and LAsix Other recommendations (labs, testing, etc):  Review anticoagulation at follow up  Follow up as an outpatient:  Dr. Harrell Gave 1/18 @ 4pm

## 2020-11-30 NOTE — Progress Notes (Signed)
4 Days Post-Op Procedure(s) (LRB): CORONARY ARTERY BYPASS GRAFTING (CABG) TIMES TWO USING BILATERAL INTERNAL MAMMARY ARTERIES (N/A) TRANSESOPHAGEAL ECHOCARDIOGRAM (TEE) (N/A) Subjective: He feels well. Ambulating without difficulty.  Objective: Vital signs in last 24 hours: Temp:  [97.6 F (36.4 C)-100 F (37.8 C)] 97.6 F (36.4 C) (01/14 0437) Pulse Rate:  [72-97] 76 (01/14 0437) Cardiac Rhythm: Normal sinus rhythm (01/14 0045) Resp:  [15-22] 20 (01/14 0437) BP: (95-112)/(50-78) 106/66 (01/14 0437) SpO2:  [85 %-95 %] 94 % (01/14 0437) Weight:  [132.6 kg] 132.6 kg (01/14 0434)  Hemodynamic parameters for last 24 hours:    Intake/Output from previous day: 01/13 0701 - 01/14 0700 In: 600 [P.O.:600] Out: 3800 [Urine:3800] Intake/Output this shift: No intake/output data recorded.  General appearance: alert and cooperative Neurologic: intact Heart: regular rate and rhythm, S1, S2 normal, no murmur, click, rub or gallop Lungs: clear to auscultation bilaterally Extremities: edema mild Wound: incision healing well.  Lab Results: Recent Labs    11/27/20 1613 11/28/20 0417  WBC 13.5* 10.9*  HGB 14.2 13.5  HCT 41.7 41.2  PLT 160 126*   BMET:  Recent Labs    11/27/20 1613 11/28/20 0417  NA 136 136  K 3.8 3.7  CL 106 104  CO2 21* 22  GLUCOSE 125* 127*  BUN 10 9  CREATININE 0.79 0.85  CALCIUM 8.0* 8.3*    PT/INR: No results for input(s): LABPROT, INR in the last 72 hours. ABG    Component Value Date/Time   PHART 7.400 11/27/2020 1014   HCO3 22.5 11/27/2020 1014   TCO2 24 11/27/2020 1014   ACIDBASEDEF 2.0 11/27/2020 1014   O2SAT 93.0 11/27/2020 1014   CBG (last 3)  Recent Labs    11/27/20 2355 11/28/20 0349 11/28/20 0815  GLUCAP 147* 119* 143*    Assessment/Plan: S/P Procedure(s) (LRB): CORONARY ARTERY BYPASS GRAFTING (CABG) TIMES TWO USING BILATERAL INTERNAL MAMMARY ARTERIES (N/A) TRANSESOPHAGEAL ECHOCARDIOGRAM (TEE) (N/A)  POD  4  Hemodynamically stable in sinus rhythm. Continue amiodarone and lopressor.  Diuresed -3200 cc yesterday and wt down 2 lbs. Still 6 lbs over preop. Will continue lasix and KCL for a week at home.  He desats a little when sleeping which is likely sleep apnea given his body habitus. I think he should be fine in the short term but needs to get that evaluated.  Plan home today.   LOS: 12 days    Gaye Pollack 11/30/2020

## 2020-12-03 ENCOUNTER — Telehealth: Payer: Self-pay | Admitting: *Deleted

## 2020-12-03 ENCOUNTER — Ambulatory Visit (INDEPENDENT_AMBULATORY_CARE_PROVIDER_SITE_OTHER): Payer: Self-pay

## 2020-12-03 ENCOUNTER — Encounter: Payer: Self-pay | Admitting: Surgery

## 2020-12-03 DIAGNOSIS — I480 Paroxysmal atrial fibrillation: Secondary | ICD-10-CM

## 2020-12-03 LAB — CUP PACEART REMOTE DEVICE CHECK
Battery Remaining Longevity: 139 mo
Battery Remaining Percentage: 95.5 %
Battery Voltage: 3.01 V
Brady Statistic AP VP Percent: 1 %
Brady Statistic AP VS Percent: 1 %
Brady Statistic AS VP Percent: 1 %
Brady Statistic AS VS Percent: 97 %
Brady Statistic RA Percent Paced: 1 %
Brady Statistic RV Percent Paced: 1 %
Date Time Interrogation Session: 20220116005023
Implantable Lead Implant Date: 20190322
Implantable Lead Implant Date: 20190322
Implantable Lead Location: 753859
Implantable Lead Location: 753860
Implantable Pulse Generator Implant Date: 20190322
Lead Channel Impedance Value: 410 Ohm
Lead Channel Impedance Value: 480 Ohm
Lead Channel Pacing Threshold Amplitude: 0.5 V
Lead Channel Pacing Threshold Amplitude: 1 V
Lead Channel Pacing Threshold Pulse Width: 0.5 ms
Lead Channel Pacing Threshold Pulse Width: 0.5 ms
Lead Channel Sensing Intrinsic Amplitude: 1.1 mV
Lead Channel Sensing Intrinsic Amplitude: 6.7 mV
Lead Channel Setting Pacing Amplitude: 2 V
Lead Channel Setting Pacing Amplitude: 2.5 V
Lead Channel Setting Pacing Pulse Width: 0.5 ms
Lead Channel Setting Sensing Sensitivity: 2.5 mV
Pulse Gen Model: 2272
Pulse Gen Serial Number: 8996724

## 2020-12-03 NOTE — Telephone Encounter (Signed)
Left VM on Ryan Clarke self addressed recording stating that his d/c summary states to continue taking 10mg  Prasugrel.

## 2020-12-04 ENCOUNTER — Ambulatory Visit: Payer: 59 | Admitting: Cardiology

## 2020-12-06 ENCOUNTER — Other Ambulatory Visit: Payer: Self-pay

## 2020-12-06 ENCOUNTER — Ambulatory Visit
Admission: RE | Admit: 2020-12-06 | Discharge: 2020-12-06 | Disposition: A | Discharge status: Home / Self Care | Payer: Medicare HMO | Source: Ambulatory Visit | Attending: Surgery | Admitting: Surgery

## 2020-12-06 ENCOUNTER — Ambulatory Visit: Payer: Self-pay

## 2020-12-06 DIAGNOSIS — Z4802 Encounter for removal of sutures: Secondary | ICD-10-CM

## 2020-12-06 DIAGNOSIS — Z951 Presence of aortocoronary bypass graft: Secondary | ICD-10-CM

## 2020-12-06 DIAGNOSIS — R059 Cough, unspecified: Secondary | ICD-10-CM

## 2020-12-06 NOTE — Progress Notes (Signed)
Patient arrived for nurse visit to remove suture/staples post- procedure CABG x2 11/26/2020 with Dr. Cyndia Bent.  4 Sutures removed from mid-abdomen with no signs/ symptoms of infection noted.  Patient tolerated procedure well.  Patient/ family instructed to keep the incision sites clean and dry.  Patient/ family acknowledged instructions given. Advised to contact the office with any questions or concerns about signs/symptoms of infection.  Patient stated that he has began to cough significantly at night when he lies down at night.  Patient still taking Lasix daily and has two more doses left.  Advised patient to get a chest xray after leaving the appointment just as precaution to check for potential effusion.  He acknowledged receipt.  Order placed.

## 2020-12-17 ENCOUNTER — Telehealth: Payer: Self-pay | Admitting: Cardiology

## 2020-12-17 ENCOUNTER — Telehealth: Payer: Self-pay

## 2020-12-17 DIAGNOSIS — I25119 Atherosclerotic heart disease of native coronary artery with unspecified angina pectoris: Secondary | ICD-10-CM

## 2020-12-17 MED ORDER — PRASUGREL HCL 10 MG PO TABS: 10.0000 mg | ORAL_TABLET | Freq: Every day | ORAL | 3 refills | Status: DC

## 2020-12-17 NOTE — Telephone Encounter (Signed)
Patient contacted the office with medication questions.  He is s/p CABG x2 with Dr. Cyndia Bent 11/26/2020.  He had medication refill questions.  Advised to contact Cardiology office for future refills, he acknowledged receipt. Noticed patient did not make it to his Cardiology f/u appointment after surgery. Advised that he needed to reschedule the appointment as he will continue to follow-up with Cardiology long term. He acknowledged receipt.

## 2020-12-17 NOTE — Telephone Encounter (Signed)
*  STAT* If patient is at the pharmacy, call can be transferred to refill team.   1. Which medications need to be refilled? (please list name of each medication and dose if known) prasugrel (EFFIENT) 10 MG TABS tablet  2. Which pharmacy/location (including street and city if local pharmacy) is medication to be sent to? [facility] [place] [place] - [place]  3. Do they need a 30 day or 90 day supply? 90 day   Patient is out of medication

## 2020-12-18 NOTE — Progress Notes (Signed)
Remote pacemaker transmission.   

## 2020-12-22 ENCOUNTER — Other Ambulatory Visit: Payer: Self-pay | Admitting: Surgical

## 2020-12-25 ENCOUNTER — Other Ambulatory Visit: Payer: Self-pay | Admitting: Surgery

## 2020-12-25 DIAGNOSIS — Z951 Presence of aortocoronary bypass graft: Secondary | ICD-10-CM

## 2020-12-26 ENCOUNTER — Other Ambulatory Visit: Payer: Self-pay

## 2020-12-26 ENCOUNTER — Ambulatory Visit (INDEPENDENT_AMBULATORY_CARE_PROVIDER_SITE_OTHER): Payer: Self-pay | Admitting: Surgery

## 2020-12-26 ENCOUNTER — Encounter: Payer: Self-pay | Admitting: Surgery

## 2020-12-26 ENCOUNTER — Encounter (HOSPITAL_COMMUNITY): Payer: Self-pay

## 2020-12-26 ENCOUNTER — Encounter (HOSPITAL_COMMUNITY): Payer: Self-pay | Admitting: Physician Assistant

## 2020-12-26 ENCOUNTER — Ambulatory Visit
Admission: RE | Admit: 2020-12-26 | Discharge: 2020-12-26 | Disposition: A | Discharge status: Home / Self Care | Payer: Medicare HMO | Source: Ambulatory Visit | Attending: Surgery | Admitting: Surgery

## 2020-12-26 ENCOUNTER — Ambulatory Visit (HOSPITAL_COMMUNITY)
Admission: RE | Admit: 2020-12-26 | Discharge: 2020-12-26 | Disposition: A | Discharge status: Home / Self Care | Payer: Medicare HMO | Source: Ambulatory Visit | Attending: Physician Assistant | Admitting: Physician Assistant

## 2020-12-26 VITALS — BP 131/82 | HR 55 | Resp 20 | Ht 73.0 in | Wt 271.0 lb

## 2020-12-26 VITALS — BP 114/78 | HR 58 | Ht 73.0 in | Wt 274.4 lb

## 2020-12-26 DIAGNOSIS — I2510 Atherosclerotic heart disease of native coronary artery without angina pectoris: Secondary | ICD-10-CM | POA: Diagnosis not present

## 2020-12-26 DIAGNOSIS — I480 Paroxysmal atrial fibrillation: Secondary | ICD-10-CM | POA: Diagnosis present

## 2020-12-26 DIAGNOSIS — Z951 Presence of aortocoronary bypass graft: Secondary | ICD-10-CM

## 2020-12-26 DIAGNOSIS — E669 Obesity, unspecified: Secondary | ICD-10-CM | POA: Insufficient documentation

## 2020-12-26 DIAGNOSIS — Z955 Presence of coronary angioplasty implant and graft: Secondary | ICD-10-CM | POA: Diagnosis not present

## 2020-12-26 DIAGNOSIS — Z6836 Body mass index (BMI) 36.0-36.9, adult: Secondary | ICD-10-CM | POA: Insufficient documentation

## 2020-12-26 DIAGNOSIS — D6869 Other thrombophilia: Secondary | ICD-10-CM | POA: Diagnosis not present

## 2020-12-26 DIAGNOSIS — I10 Essential (primary) hypertension: Secondary | ICD-10-CM | POA: Insufficient documentation

## 2020-12-26 DIAGNOSIS — E785 Hyperlipidemia, unspecified: Secondary | ICD-10-CM | POA: Insufficient documentation

## 2020-12-26 DIAGNOSIS — Z950 Presence of cardiac pacemaker: Secondary | ICD-10-CM | POA: Diagnosis not present

## 2020-12-26 MED ORDER — AMIODARONE HCL 200 MG PO TABS: 200.0000 mg | ORAL_TABLET | Freq: Every day | ORAL | 3 refills | Status: DC

## 2020-12-26 NOTE — Progress Notes (Signed)
Primary Care Physician: [name], MD Primary Cardiologist: Dr Harrell Gave  Primary Electrophysiologist: Dr Caryl Comes Referring Physician: Device clinic/Dr Caiden Arteaga is a 65 y.o. male with a history of CAD s/p several stents, HTN, HLD, polycythemia, PPM, and paroxysmal atrial fibrillation who presents for consultation in the [facility]. The patient was initially diagnosed with atrial fibrillation 09/01/20 with the device clinic receiving and alert for an ongoing episode, overall rate controlled. Patient has a CHADS2VASC score of 2. He has been experiencing exertional chest discomfort over the last several weeks. He also had heart racing which corresponded to the afib detected on his device.   Patient underwent LHC on 09/14/20 which showed multivessel diffuse stenting, LAD with focal 50% ostial narrowing, OM vessel with 99% stenosis with thrombus in the circumflex stent immediately after the takeoff of the stented OM vessel. Angioplasty performed.   On follow up today, patient was admitted 11/16/20 with unstable angina and underwent CABG with Dr Cyndia Bent on 11/26/20. He did have some post operative afib and was started on amiodarone which converted him to SR. He states today that he "feels like a million bucks." He remains in SR.   Today, he denies symptoms of palpitations, chest pain, SOB, orthopnea, PND, lower extremity edema, dizziness, presyncope, syncope, snoring, daytime somnolence, bleeding, or neurologic sequela. The patient is tolerating medications without difficulties and is otherwise without complaint today.    Atrial Fibrillation Risk Factors:  he does not have symptoms or diagnosis of sleep apnea. he does not have a history of rheumatic fever.   he has a BMI of Body mass index is 36.2 kg/m.Marland Kitchen Filed Weights   12/26/20 1423  Weight: 124.5 kg    No family history on file.   Atrial Fibrillation Management history:  Previous  antiarrhythmic drugs: amiodarone  Previous cardioversions: none Previous ablations: none CHADS2VASC score: 3 Anticoagulation history: none   Past Medical History:  Diagnosis Date  . Carpal tunnel syndrome 09/10/2020  . Coronary artery disease   . Hypertension    Past Surgical History:  Procedure Laterality Date  . CORONARY ARTERY BYPASS GRAFT N/A 11/26/2020   Procedure: CORONARY ARTERY BYPASS GRAFTING (CABG) TIMES TWO USING BILATERAL INTERNAL MAMMARY ARTERIES;  Surgeon: Gaye Pollack, MD;  Location: [facility];  Service: Open Heart Surgery;  Laterality: N/A;  BIMA  . CORONARY BALLOON ANGIOPLASTY N/A 09/14/2020   Procedure: CORONARY BALLOON ANGIOPLASTY;  Surgeon: Troy Sine, MD;  Location: [facility];  Service: Cardiovascular;  Laterality: N/A;  . CORONARY STENT PLACEMENT    . LEFT HEART CATH AND CORONARY ANGIOGRAPHY N/A 09/14/2020   Procedure: LEFT HEART CATH AND CORONARY ANGIOGRAPHY;  Surgeon: Troy Sine, MD;  Location: [place];  Service: Cardiovascular;  Laterality: N/A;  . LEFT HEART CATH AND CORONARY ANGIOGRAPHY N/A 11/19/2020   Procedure: LEFT HEART CATH AND CORONARY ANGIOGRAPHY;  Surgeon: Troy Sine, MD;  Location: [facility];  Service: Cardiovascular;  Laterality: N/A;  . PACEMAKER INSERTION    . TEE WITHOUT CARDIOVERSION N/A 11/26/2020   Procedure: TRANSESOPHAGEAL ECHOCARDIOGRAM (TEE);  Surgeon: Gaye Pollack, MD;  Location: [facility];  Service: Open Heart Surgery;  Laterality: N/A;    Current Outpatient Medications  Medication Sig Dispense Refill  . acetaminophen (TYLENOL) 325 MG tablet Take 2 tablets (650 mg total) by mouth every 6 (six) hours as needed for mild pain.    Marland Kitchen aspirin EC 81 MG tablet Take 81 mg  by mouth daily. Swallow whole.    Marland Kitchen atorvastatin (LIPITOR) 80 MG tablet Take 80 mg by mouth at bedtime.     . famotidine (PEPCID) 20 MG tablet TAKE ONE-HALF TABLET BY MOUTH 2 TIMES DAILY AS NEEDED FOR HEARTBURN OR INDIGESTION. 30 tablet  1  . metoprolol tartrate (LOPRESSOR) 25 MG tablet Take 1 tablet (25 mg total) by mouth 2 (two) times daily. 60 tablet 1  . prasugrel (EFFIENT) 10 MG TABS tablet Take 1 tablet (10 mg total) by mouth daily. 90 tablet 3  . amiodarone (PACERONE) 200 MG tablet Take 1 tablet (200 mg total) by mouth daily. 30 tablet 3   No current facility-administered medications for this encounter.    No Known Allergies  Social History   Socioeconomic History  . Marital status: Legally Separated    Spouse name: Not on file  . Number of children: Not on file  . Years of education: Not on file  . Highest education level: Not on file  Occupational History  . Not on file  Tobacco Use  . Smoking status: Never Smoker  . Smokeless tobacco: Never Used  Substance and Sexual Activity  . Alcohol use: Never  . Drug use: Never  . Sexual activity: Not on file  Other Topics Concern  . Not on file  Social History Narrative  . Not on file   Social Determinants of Health   Financial Resource Strain: Not on file  Food Insecurity: Not on file  Transportation Needs: Not on file  Physical Activity: Not on file  Stress: Not on file  Social Connections: Not on file  Intimate Partner Violence: Not on file     ROS- All systems are reviewed and negative except as per the HPI above.  Physical Exam: Vitals:   12/26/20 1423  BP: 114/78  Pulse: (!) 58  Weight: 124.5 kg  Height: 6\' 1"  (1.854 m)    GEN- The patient is well appearing obese male, alert and oriented x 3 today.   HEENT-head normocephalic, atraumatic, sclera clear, conjunctiva pink, hearing intact, trachea midline. Lungs- Clear to ausculation bilaterally, normal work of breathing Heart- Regular rate and rhythm, no murmurs, rubs or gallops  GI- soft, NT, ND, + BS Extremities- no clubbing, cyanosis, or edema MS- no significant deformity or atrophy Skin- no rash or lesion Psych- euthymic mood, full affect Neuro- strength and sensation are  intact   Wt Readings from Last 3 Encounters:  12/26/20 124.5 kg  12/26/20 122.9 kg  11/30/20 132.6 kg    EKG today demonstrates  SR, RBBB, 1st degree AV block Vent. rate 58 BPM PR interval 204 ms QRS duration 154 ms QT/QTc 492/482 ms  LHC 09/14/20 Multivessel diffuse stenting with multiple stents inserted from the ostium of the LAD to the distal third of the LAD with focal 50% ostial narrowing; stenting of the proximal to mid circumflex vessel with bifurcation stenting of the OM vessel with 99% stenosis with thrombus in the circumflex stent immediately after the takeoff of the stented OM vessel with TIMI 0-I flow down the distal circumflex; and diffuse stenting of the entire dominant RCA extending from the ostium to the PDA without significant restenosis.  Preserved global LV function  Very difficult intervention with initial plaque shifting into the OM vessel requiring ultimate PTCA of both the OM ostium as well as the circumflex stent with Cutting Balloon intervention to the circumflex stent due to persistent intimal hyperplasia not resolved with noncompliant balloon dilatation.  Ultimate kissing balloon  technique to the OM and circumflex with a residual narrowing in the OM at 20% and in the circumflex at  30% with resultant brisk TIMI-3 flow to the distal circumflex vessel supplying several additional vessels.   Epic records are reviewed at length today  CHA2DS2-VASc Score = 3  The patient's score is based upon: CHF History: No HTN History: Yes Diabetes History: No Stroke History: No Vascular Disease History: Yes Age Score: 1 Gender Score: 0      ASSESSMENT AND PLAN: 1. Paroxysmal Atrial Fibrillation (ICD10:  I48.0) The patient's CHA2DS2-VASc score is 3, indicating a 3.2% annual risk of stroke.   Patient appears to be maintaining SR. Decrease amiodarone to 200 mg daily. Would continue to wean as he maintains SR.  Patient currently on DAPT. Will defer timing of  anticoagulation to primary cardiologist following CABG to avoid triple therapy.  Continue Toprol 25 mg daily.  2. Secondary Hypercoagulable State (ICD10:  D68.69) The patient is at significant risk for stroke/thromboembolism based upon his CHA2DS2-VASc Score of 3.  See plans above.  3. Obesity Body mass index is 36.2 kg/m. Lifestyle modification was discussed and encouraged including regular physical activity and weight reduction.  4. CAD S/p CABG with Dr Cyndia Bent 11/26/20. No anginal symptoms.  5. HTN Stable, no changes today.  6. PPM Followed by Dr Caryl Comes and the device clinic.   Follow up with Coletta Memos as scheduled.    [facility] [address] [place], [place] [address] [phone] 12/26/2020 4:28 PM

## 2020-12-26 NOTE — Progress Notes (Signed)
HPI: Patient returns for routine postoperative follow-up having undergone coronary bypass graft surgery x2 using bilateral internal mammary artery graft on 11/26/2020. The patient's early postoperative recovery while in the hospital was notable for development of postoperative atrial fibrillation converted with amiodarone.  He had some recurrent episodes and was started on Eliquis. Since hospital discharge the patient reports that he has been feeling well overall.  He has had occasional episodes where his blood pressure dropped in the 90s and he felt somewhat dizzy and diaphoretic.  He has had some low-level nausea.  He has not noted any tachypalpitations.   Current Outpatient Medications  Medication Sig Dispense Refill  . acetaminophen (TYLENOL) 325 MG tablet Take 2 tablets (650 mg total) by mouth every 6 (six) hours as needed for mild pain.    Marland Kitchen amiodarone (PACERONE) 200 MG tablet Take 1 tablet (200 mg total) by mouth 2 (two) times daily. 60 tablet 1  . aspirin EC 81 MG tablet Take 81 mg by mouth daily. Swallow whole.    Marland Kitchen atorvastatin (LIPITOR) 80 MG tablet Take 80 mg by mouth at bedtime.     . famotidine (PEPCID) 20 MG tablet TAKE ONE-HALF TABLET BY MOUTH 2 TIMES DAILY AS NEEDED FOR HEARTBURN OR INDIGESTION. (Patient taking differently: Take 10 mg by mouth 2 (two) times daily as needed for heartburn or indigestion.) 30 tablet 1  . metoprolol tartrate (LOPRESSOR) 25 MG tablet Take 1 tablet (25 mg total) by mouth 2 (two) times daily. 60 tablet 1  . prasugrel (EFFIENT) 10 MG TABS tablet Take 1 tablet (10 mg total) by mouth daily. 90 tablet 3  . furosemide (LASIX) 40 MG tablet Take 1 tablet (40 mg total) by mouth daily. (Patient not taking: Reported on 12/26/2020) 7 tablet 0  . potassium chloride SA (KLOR-CON) 20 MEQ tablet Take 1 tablet (20 mEq total) by mouth daily. (Patient not taking: Reported on 12/26/2020) 7 tablet 0  . traMADol (ULTRAM) 50 MG tablet Take 1 tablet (50 mg total) by mouth every  6 (six) hours as needed for moderate pain. (Patient not taking: Reported on 12/26/2020) 28 tablet 0   No current facility-administered medications for this visit.    Physical Exam: BP 131/82 (BP Location: Right Arm, Patient Position: Sitting)   Pulse (!) 55   Resp 20   Ht 6\' 1"  (1.854 m)   Wt 271 lb (122.9 kg)   SpO2 96% Comment: RA  BMI 35.75 kg/m  He looks well. Cardiac exam shows regular rate and rhythm with normal heart sounds. Lung exam is clear. The chest incision is healing well and the sternum is stable. There is no peripheral edema.  Diagnostic Tests:  Narrative & Impression  CLINICAL DATA:  Status post coronary artery bypass grafting  EXAM: CHEST - 2 VIEW  COMPARISON:  December 06, 2020  FINDINGS: No edema or airspace opacity. Heart is upper normal in size with pulmonary vascularity normal. Patient is status post coronary artery bypass grafting. Previously placed coronary stents noted. Pacemaker leads attached to right atrium and right ventricle. No adenopathy. There is degenerative change in the thoracic spine. No pneumothorax.  IMPRESSION: Lungs clear. Pacemaker leads attached to right atrium and right ventricle. Heart upper normal in size.   Electronically Signed   By: Lowella Grip III M.D.   On: 12/26/2020 09:43     Impression:  Overall he is doing well following surgery.  I asked him to decrease the amiodarone to 200 mg daily.  This may  be the reason for his nausea and hopefully he can get off that in the near future.  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  Plan:  He will decrease amiodarone to 200 mg daily and continue Toprol at his current dose.  He will continue the Eliquis.  He has follow-up arranged with Dr. Harrell Gave and she can decide when to discontinue the amiodarone.  He will return to see me if he has any problems with his  incisions.   Gaye Pollack, MD [facility] [phone]

## 2021-01-02 NOTE — Progress Notes (Signed)
Cardiology Clinic Note   Patient Name: Cyruss Arata Date of Encounter: 01/03/2021  Primary Care Provider:  [name], MD Primary Cardiologist:  Buford Dresser, MD  Patient Profile    Keath Matera 65 year old male presents the clinic today for follow-up evaluation of his coronary artery disease status post CABG x2 on 11/26/2020.  Past Medical History    Past Medical History:  Diagnosis Date  . Carpal tunnel syndrome 09/10/2020  . Coronary artery disease   . Hypertension    Past Surgical History:  Procedure Laterality Date  . CORONARY ARTERY BYPASS GRAFT N/A 11/26/2020   Procedure: CORONARY ARTERY BYPASS GRAFTING (CABG) TIMES TWO USING BILATERAL INTERNAL MAMMARY ARTERIES;  Surgeon: Gaye Pollack, MD;  Location: [facility];  Service: Open Heart Surgery;  Laterality: N/A;  BIMA  . CORONARY BALLOON ANGIOPLASTY N/A 09/14/2020   Procedure: CORONARY BALLOON ANGIOPLASTY;  Surgeon: Troy Sine, MD;  Location: [facility];  Service: Cardiovascular;  Laterality: N/A;  . CORONARY STENT PLACEMENT    . LEFT HEART CATH AND CORONARY ANGIOGRAPHY N/A 09/14/2020   Procedure: LEFT HEART CATH AND CORONARY ANGIOGRAPHY;  Surgeon: Troy Sine, MD;  Location: [facility];  Service: Cardiovascular;  Laterality: N/A;  . LEFT HEART CATH AND CORONARY ANGIOGRAPHY N/A 11/19/2020   Procedure: LEFT HEART CATH AND CORONARY ANGIOGRAPHY;  Surgeon: Troy Sine, MD;  Location: [facility];  Service: Cardiovascular;  Laterality: N/A;  . PACEMAKER INSERTION    . TEE WITHOUT CARDIOVERSION N/A 11/26/2020   Procedure: TRANSESOPHAGEAL ECHOCARDIOGRAM (TEE);  Surgeon: Gaye Pollack, MD;  Location: [facility];  Service: Open Heart Surgery;  Laterality: N/A;    Allergies  No Known Allergies  History of Present Illness    Mr. Enberg is a PMH of hypertension, hyperlipidemia, polycythemia, PPM paroxysmal atrial fibrillation and coronary artery disease status post several stents.  He underwent  cardiac catheterization 09/14/2020 which showed multivessel diffuse stenting, LAD 50% ostial narrowing, OM vessel 99% stenosis with thrombus in the circumflex stent.  Angioplasty was performed.  He was admitted 11/16/2020 with unstable angina and underwent CABG x2 by Dr. Cyndia Bent.  He was noted to have postoperative atrial fibrillation and was started on amiodarone which converted him to sinus rhythm.  He reported he felt much better in sinus rhythm.  CHA2DS2-VASc score 3.  He was seen in the atrial fibrillation clinic by Malka So, PA-C on 12/26/2020.  During that time he denied palpitations, chest pain, orthopnea, PND, increased work of breathing, lower extremity swelling, dizziness, syncope, increased fatigue or neurological sequela.  He was tolerating his medications well and had no complaints that time.  He presents to the clinic today for follow-up evaluation states he feels well.  He denies any further episodes of atrial fibrillation or irregular heartbeats.  His EKG today shows sinus bradycardia right bundle branch block 57 bpm.  He has been walking for an hour to an hour and a half daily and does not notice any chest pain.  His main complaint is his right neck.  He reports that he has intermittent episodes of pain.  He reports that his incisions are all healed well.  I discussed his amiodarone use the DOD.  We will have him finish his 200 mg prescription.  Have him take 100 mg of amiodarone x1 month, follow-up with the A. fib clinic, continue his aspirin and Effient, have him wear a 14-day ZIO monitor, give him salty 6 diet sheet, have him increase his  p.o. hydration, and follow-up with Dr. Harrell Gave in 3 months.  Today he denies chest pain, shortness of breath, lower extremity edema, fatigue, palpitations, melena, hematuria, hemoptysis, diaphoresis, weakness, presyncope, syncope, orthopnea, and PND.   Home Medications    Prior to Admission medications   Medication Sig Start Date End Date  Taking? Authorizing Provider  acetaminophen (TYLENOL) 325 MG tablet Take 2 tablets (650 mg total) by mouth every 6 (six) hours as needed for mild pain. 11/30/20   Gold, Wilder Glade, PA-C  amiodarone (PACERONE) 200 MG tablet Take 1 tablet (200 mg total) by mouth daily. 12/26/20   Fenton, Clint R, PA  aspirin EC 81 MG tablet Take 81 mg by mouth daily. Swallow whole.    [provider]  atorvastatin (LIPITOR) 80 MG tablet Take 80 mg by mouth at bedtime.  07/27/20   [provider]  famotidine (PEPCID) 20 MG tablet TAKE ONE-HALF TABLET BY MOUTH 2 TIMES DAILY AS NEEDED FOR HEARTBURN OR INDIGESTION. 11/05/20   [name], MD  metoprolol tartrate (LOPRESSOR) 25 MG tablet Take 1 tablet (25 mg total) by mouth 2 (two) times daily. 11/30/20   John Giovanni, PA-C  prasugrel (EFFIENT) 10 MG TABS tablet Take 1 tablet (10 mg total) by mouth daily. 12/17/20   Buford Dresser, MD    Family History    No family history on file. has no family status information on file.   Social History    Social History   Socioeconomic History  . Marital status: Legally Separated    Spouse name: Not on file  . Number of children: Not on file  . Years of education: Not on file  . Highest education level: Not on file  Occupational History  . Not on file  Tobacco Use  . Smoking status: Never Smoker  . Smokeless tobacco: Never Used  Substance and Sexual Activity  . Alcohol use: Never  . Drug use: Never  . Sexual activity: Not on file  Other Topics Concern  . Not on file  Social History Narrative  . Not on file   Social Determinants of Health   Financial Resource Strain: Not on file  Food Insecurity: Not on file  Transportation Needs: Not on file  Physical Activity: Not on file  Stress: Not on file  Social Connections: Not on file  Intimate Partner Violence: Not on file     Review of Systems    General:  No chills, fever, night sweats or weight changes.  Cardiovascular:  No chest pain,  dyspnea on exertion, edema, orthopnea, palpitations, paroxysmal nocturnal dyspnea. Dermatological: No rash, lesions/masses Respiratory: No cough, dyspnea Urologic: No hematuria, dysuria Abdominal:   No nausea, vomiting, diarrhea, bright red blood per rectum, melena, or hematemesis Neurologic:  No visual changes, wkns, changes in mental status. All other systems reviewed and are otherwise negative except as noted above.  Physical Exam    VS:  BP 106/69   Pulse (!) 57   Ht 6\' 1"  (1.854 m)   Wt 272 lb (123.4 kg)   SpO2 96%   BMI 35.89 kg/m  , BMI Body mass index is 35.89 kg/m. GEN: Well nourished, well developed, in no acute distress. HEENT: normal. Neck: Supple, no JVD, carotid bruits, or masses. Cardiac: RRR, no murmurs, rubs, or gallops. No clubbing, cyanosis, edema.  Radials/DP/PT 2+ and equal bilaterally.  Respiratory:  Respirations regular and unlabored, clear to auscultation bilaterally. GI: Soft, nontender, nondistended, BS + x 4. MS: no deformity or atrophy. Skin:  warm and dry, no rash. Neuro:  Strength and sensation are intact. Psych: Normal affect.  Accessory Clinical Findings    Recent Labs: 11/17/2020: B Natriuretic Peptide 31.5 11/25/2020: ALT 88 11/27/2020: Magnesium 2.1 11/28/2020: BUN 9; Creatinine, Ser 0.85; Hemoglobin 13.5; Platelets 126; Potassium 3.7; Sodium 136   Recent Lipid Panel    Component Value Date/Time   CHOL 96 (L) 08/17/2020 1339   TRIG 174 (H) 08/17/2020 1339   HDL 32 (L) 08/17/2020 1339   CHOLHDL 3.0 08/17/2020 1339   LDLCALC 35 08/17/2020 1339   LDLDIRECT 33 08/17/2020 1339    ECG personally reviewed by me today-sinus bradycardia right bundle branch block no ST or T wave deviation 57 bpm- No acute changes  Cardiac catheterization 12/16/2020  1st Mrg lesion is 40% stenosed.  Prox Cx to Mid Cx lesion is 80% stenosed.  Non-stenotic RPAV lesion was previously treated.  Ost RCA to Dist RCA lesion is 5% stenosed.  Dist LM to Ost LAD  lesion is 80% stenosed.  Mid LAD lesion is 60% stenosed with 60% stenosed side branch in 1st Sept.  Previously placed 2nd Diag stent (unknown type) is widely patent.   Multivessel CAD with stents in the LAD, diagonal, circumflex, circumflex marginal, and RCA extending to the PLA takeoff.  The LAD has progressive 80% ostial stenosis which seems to occur immediately proximal to the proximal stent.  There is diffuse 60% narrowing in the proximal and mid stented segment.  The diagonal stent is patent.  The distal portion of the mid LAD stent is patent.  Left circumflex stent extending from the ostium to the mid AV groove circumflex with focal eccentric 80% restenosis in the mid circumflex immediately after the takeoff of the marginal vessel.  There is 30 to 40% ostial narrowing in th marginal vessel which is stented ostially to proximally.  The RCA is diffusely stented from the ostium to the takeoff of the PLA vessel.  There is mild luminal irregularity.  Preserved global LV contractility with EF estimate at 18 mmHg.  RECOMMENDATION: The angiographic findings were reviewed with Dr. Gwenlyn Found in the catheterization laboratory.  With the ostial progression of 80% in the LAD as well as again restenosis in the circumflex vessel recommend surgical consultation for optimal long-term benefit.  We will hold prasugrel.  Increase anti-ischemic medications, optimal lipid management and BP control.  Diagnostic Dominance: Right    Intervention   Echocardiogram 11/19/2020 IMPRESSIONS    1. Left ventricular ejection fraction, by estimation, is 55 to 60%. The  left ventricle has normal function. The left ventricle has no regional  wall motion abnormalities. There is mild concentric left ventricular  hypertrophy. Left ventricular diastolic  parameters are consistent with Grade I diastolic dysfunction (impaired  relaxation).  2. Right ventricular systolic function is normal. The right ventricular   size is normal.  3. The mitral valve is normal in structure. No evidence of mitral valve  regurgitation. No evidence of mitral stenosis.  4. The aortic valve is tricuspid. Aortic valve regurgitation is not  visualized. No aortic stenosis is present.  5. Aortic dilatation noted. There is mild dilatation at the level of the  sinuses of Valsalva, measuring 39 mm.  6. The inferior vena cava is normal in size with greater than 50%  respiratory variability, suggesting right atrial pressure of 3 mmHg.   Assessment & Plan   1.  Coronary artery disease-no chest pain today.  Underwent CABG x2 by Dr. Cyndia Bent on 11/26/2020.  Was noted to  have postoperative atrial fibrillation and was placed on amiodarone.  He has been seen by A. fib clinic. Continue aspirin, metoprolol, Effient Heart healthy low-sodium diet-salty 6 given Increase physical activity as tolerated  Paroxysmal atrial fibrillation-heart rate today 57 bpm.  No recent episodes of increased heart rate or irregular beats. Continue amiodarone, aspirin, metoprolol, Effient Avoid triggers caffeine, chocolate, EtOH, dehydration etc. reduce amiodarone to 100 mg daily order 14-day ZIO monitor follow-up with A. fib clinic  Essential hypertension-BP today 106/69.  Well-controlled at home. Continue metoprolol Heart healthy low-sodium diet-salty 6 given Increase physical activity as tolerated increase p.o. hydration  Permanent pacemaker-interrogated 12/28/2020.  Abnormal findings showing atrial fibrillation and A. fib all were consistent with coronary artery bypass grafting.  Battery life 139 months Follows with Dr. Caryl Comes  Disposition: Follow-up with Dr. Harrell Gave in 3 months.  Follow-up with A. fib clinic in 1 month.  Jossie Ng. Lenoria Narine NP-C    01/03/2021, 12:33 PM [place] [address] Office [phone] Fax [phone]  Notice: This dictation was prepared with Dragon dictation along with  smaller phrase technology. Any transcriptional errors that result from this process are unintentional and may not be corrected upon review.  I spent 20 minutes examining this patient, reviewing medications, and using patient centered shared decision making involving her cardiac care.  Prior to her visit I spent greater than 20 minutes reviewing her past medical history,  medications, and prior cardiac tests.

## 2021-01-03 ENCOUNTER — Encounter: Payer: Self-pay | Admitting: *Deleted

## 2021-01-03 ENCOUNTER — Ambulatory Visit (INDEPENDENT_AMBULATORY_CARE_PROVIDER_SITE_OTHER): Payer: Medicare HMO | Admitting: General Practice

## 2021-01-03 ENCOUNTER — Other Ambulatory Visit: Payer: Self-pay

## 2021-01-03 ENCOUNTER — Encounter: Payer: Self-pay | Admitting: General Practice

## 2021-01-03 ENCOUNTER — Ambulatory Visit: Payer: Medicare HMO

## 2021-01-03 VITALS — BP 106/69 | HR 57 | Ht 73.0 in | Wt 272.0 lb

## 2021-01-03 DIAGNOSIS — I10 Essential (primary) hypertension: Secondary | ICD-10-CM

## 2021-01-03 DIAGNOSIS — I480 Paroxysmal atrial fibrillation: Secondary | ICD-10-CM

## 2021-01-03 DIAGNOSIS — Z951 Presence of aortocoronary bypass graft: Secondary | ICD-10-CM | POA: Diagnosis not present

## 2021-01-03 DIAGNOSIS — I25119 Atherosclerotic heart disease of native coronary artery with unspecified angina pectoris: Secondary | ICD-10-CM

## 2021-01-03 DIAGNOSIS — Z950 Presence of cardiac pacemaker: Secondary | ICD-10-CM

## 2021-01-03 MED ORDER — AMIODARONE HCL 100 MG PO TABS: 100.0000 mg | ORAL_TABLET | Freq: Every day | ORAL | 12 refills | Status: DC

## 2021-01-03 NOTE — Patient Instructions (Signed)
Medication Instructions:  DECREASE AMIODARONE 100MG  DAILY FOR 1 MONTH  CONTINUE ASPIRIN AND EFFIENT  *If you need a refill on your cardiac medications before your next appointment, please call your pharmacy*  Lab Work:   Testing/Procedures:  NONE    NONE  Special Instructions WEAR ZIO MONITOR FOR 14 DAYS-SEE ATTACHED DIRECTIONS  Follow-Up: FOLLOW UP WITH AFIB CLINIC IN 1 MONTH  Your next appointment:  3 month(s) In Person with Buford Dresser, MD OR IF UNAVAILABLE [place], FNP-C  At [facility], you and your health needs are our priority.  As part of our continuing mission to provide you with exceptional heart care, we have created designated Provider Care Teams.  These Care Teams include your primary Cardiologist (physician) and Advanced Practice Providers (APPs -  Physician Assistants and Nurse Practitioners) who all work together to provide you with the care you need, when you need it.  We recommend signing up for the patient portal called "MyChart".  Sign up information is provided on this After Visit Summary.  MyChart is used to connect with patients for Virtual Visits (Telemedicine).  Patients are able to view lab/test results, encounter notes, upcoming appointments, etc.  Non-urgent messages can be sent to your provider as well.   To learn more about what you can do with MyChart, go to [email or web address].     ZIO XT- Long Term Monitor Instructions   Your physician has requested you wear your ZIO patch monitor 14 days.   This is a single patch monitor.  [employer] supplies one patch monitor per enrollment.  Additional stickers are not available.   Please do not apply patch if you will be having a Nuclear Stress Test, Echocardiogram, Cardiac CT, MRI, or Chest Xray during the time frame you would be wearing the monitor. The patch cannot be worn during these tests.  You cannot remove and re-apply the ZIO XT patch monitor.   Your ZIO patch monitor will be sent USPS  Priority mail from [employer] directly to your home address. The monitor may also be mailed to a PO BOX if home delivery is not available.   It may take 3-5 days to receive your monitor after you have been enrolled.   Once you have received you monitor, please review enclosed instructions.  Your monitor has already been registered assigning a specific monitor serial # to you.   Applying the monitor   Shave hair from upper left chest.   Hold abrader disc by orange tab.  Rub abrader in 40 strokes over left upper chest as indicated in your monitor instructions.   Clean area with 4 enclosed alcohol pads .  Use all pads to assure are is cleaned thoroughly.  Let dry.   Apply patch as indicated in monitor instructions.  Patch will be place under collarbone on left side of chest with arrow pointing upward.   Rub patch adhesive wings for 2 minutes.Remove white label marked "1".  Remove white label marked "2".  Rub patch adhesive wings for 2 additional minutes.   While looking in a mirror, press and release button in center of patch.  A small green light will flash 3-4 times .  This will be your only indicator the monitor has been turned on.     Do not shower for the first 24 hours.  You may shower after the first 24 hours.   Press button if you feel a symptom. You will hear a small click.  Record Date, Time and Symptom in  the Patient Log Book.   When you are ready to remove patch, follow instructions on last 2 pages of Patient Log Book.  Stick patch monitor onto last page of Patient Log Book.   Place Patient Log Book in [place] box.  Use locking tab on box and tape box closed securely.  The [employer] has [employer] on it.  Please place in mailbox as soon as possible.  Your physician should have your test results approximately 7 days after the monitor has been mailed back to [facility].   Call [place] at [phone] if you have questions regarding  your ZIO XT patch monitor.  Call them immediately if you see an orange light blinking on your monitor.   If your monitor falls off in less than 4 days contact our Monitor department at [phone].  If your monitor becomes loose or falls off after 4 days call [employer] at [phone] for suggestions on securing your monitor.

## 2021-01-03 NOTE — Progress Notes (Signed)
Patient ID: Ryan Clarke, male   DOB: 07-02-1956, 65 y.o.   MRN: [id number] Patient enrolled for [employer] to ship a 14 day ZIO XT long term holter monitor to his home.

## 2021-01-06 ENCOUNTER — Encounter: Payer: Self-pay | Admitting: Cardiology

## 2021-01-11 ENCOUNTER — Telehealth (HOSPITAL_COMMUNITY): Payer: Self-pay

## 2021-01-11 ENCOUNTER — Encounter (HOSPITAL_COMMUNITY): Payer: Self-pay

## 2021-01-11 NOTE — Telephone Encounter (Signed)
Attempted to call patient in regards to Cardiac Rehab - LM on VM Mailed letter 

## 2021-01-20 ENCOUNTER — Other Ambulatory Visit: Payer: Self-pay | Admitting: Surgical

## 2021-01-21 ENCOUNTER — Other Ambulatory Visit: Payer: Self-pay | Admitting: Cardiology

## 2021-01-28 ENCOUNTER — Telehealth (HOSPITAL_COMMUNITY): Payer: Self-pay

## 2021-01-28 NOTE — Telephone Encounter (Signed)
No response from pt.  Closed referral  

## 2021-01-31 ENCOUNTER — Ambulatory Visit (HOSPITAL_COMMUNITY)
Admission: RE | Admit: 2021-01-31 | Discharge: 2021-01-31 | Disposition: A | Discharge status: Home / Self Care | Payer: Medicare HMO | Source: Ambulatory Visit | Attending: Physician Assistant | Admitting: Physician Assistant

## 2021-01-31 ENCOUNTER — Other Ambulatory Visit: Payer: Self-pay

## 2021-01-31 ENCOUNTER — Encounter (HOSPITAL_COMMUNITY): Payer: Self-pay | Admitting: Physician Assistant

## 2021-01-31 VITALS — BP 136/80 | HR 54 | Ht 73.0 in | Wt 275.0 lb

## 2021-01-31 DIAGNOSIS — I480 Paroxysmal atrial fibrillation: Secondary | ICD-10-CM | POA: Diagnosis present

## 2021-01-31 DIAGNOSIS — Z79899 Other long term (current) drug therapy: Secondary | ICD-10-CM | POA: Diagnosis not present

## 2021-01-31 DIAGNOSIS — D6869 Other thrombophilia: Secondary | ICD-10-CM | POA: Insufficient documentation

## 2021-01-31 DIAGNOSIS — Z951 Presence of aortocoronary bypass graft: Secondary | ICD-10-CM | POA: Diagnosis not present

## 2021-01-31 DIAGNOSIS — Z7901 Long term (current) use of anticoagulants: Secondary | ICD-10-CM | POA: Diagnosis not present

## 2021-01-31 DIAGNOSIS — Z955 Presence of coronary angioplasty implant and graft: Secondary | ICD-10-CM | POA: Insufficient documentation

## 2021-01-31 DIAGNOSIS — E669 Obesity, unspecified: Secondary | ICD-10-CM | POA: Diagnosis not present

## 2021-01-31 DIAGNOSIS — Z6836 Body mass index (BMI) 36.0-36.9, adult: Secondary | ICD-10-CM | POA: Insufficient documentation

## 2021-01-31 DIAGNOSIS — I10 Essential (primary) hypertension: Secondary | ICD-10-CM | POA: Diagnosis not present

## 2021-01-31 DIAGNOSIS — E785 Hyperlipidemia, unspecified: Secondary | ICD-10-CM | POA: Insufficient documentation

## 2021-01-31 DIAGNOSIS — I2510 Atherosclerotic heart disease of native coronary artery without angina pectoris: Secondary | ICD-10-CM | POA: Diagnosis not present

## 2021-01-31 DIAGNOSIS — Z950 Presence of cardiac pacemaker: Secondary | ICD-10-CM | POA: Diagnosis not present

## 2021-01-31 MED ORDER — APIXABAN 5 MG PO TABS
5.0000 mg | ORAL_TABLET | Freq: Two times a day (BID) | ORAL | 3 refills | Status: DC
Start: 2021-01-31 — End: 2021-05-28

## 2021-01-31 NOTE — Patient Instructions (Signed)
Stop aspirin   Start Eliquis 5mg  twice a day   Send a transmission when you return home - we will call after reviewing.

## 2021-01-31 NOTE — Progress Notes (Signed)
Primary Care Physician: [name], MD Primary Cardiologist: Dr Harrell Gave  Primary Electrophysiologist: Dr Caryl Comes Referring Physician: Device clinic/Dr Jayquan Bradsher is a 65 y.o. male with a history of CAD s/p several stents, HTN, HLD, polycythemia, PPM, and paroxysmal atrial fibrillation who presents for consultation in the [facility]. The patient was initially diagnosed with atrial fibrillation 09/01/20 with the device clinic receiving and alert for an ongoing episode, overall rate controlled. Patient has a CHADS2VASC score of 3. He has been experiencing exertional chest discomfort over the last several weeks. He also had heart racing which corresponded to the afib detected on his device.   Patient underwent LHC on 09/14/20 which showed multivessel diffuse stenting, LAD with focal 50% ostial narrowing, OM vessel with 99% stenosis with thrombus in the circumflex stent immediately after the takeoff of the stented OM vessel. Angioplasty performed. Patient was admitted 11/16/20 with unstable angina and underwent CABG with Dr Cyndia Bent on 11/26/20. He did have some post operative afib and was started on amiodarone which converted him to SR.   On follow up today, patient reports he has done well since his last visit. He denies any heart racing or palpitations or CP. He denies any bleeding issues on DAPT. His amiodarone was decreased to 100 mg daily on 01/03/21.  Today, he denies symptoms of palpitations, chest pain, SOB, orthopnea, PND, lower extremity edema, dizziness, presyncope, syncope, snoring, daytime somnolence, bleeding, or neurologic sequela. The patient is tolerating medications without difficulties and is otherwise without complaint today.    Atrial Fibrillation Risk Factors:  he does not have symptoms or diagnosis of sleep apnea. he does not have a history of rheumatic fever.   he has a BMI of Body mass index is 36.28 kg/m.Marland Kitchen Filed Weights    01/31/21 1135  Weight: 124.7 kg    No family history on file.   Atrial Fibrillation Management history:  Previous antiarrhythmic drugs: amiodarone  Previous cardioversions: none Previous ablations: none CHADS2VASC score: 3 Anticoagulation history: none   Past Medical History:  Diagnosis Date  . Carpal tunnel syndrome 09/10/2020  . Coronary artery disease   . Hypertension    Past Surgical History:  Procedure Laterality Date  . CORONARY ARTERY BYPASS GRAFT N/A 11/26/2020   Procedure: CORONARY ARTERY BYPASS GRAFTING (CABG) TIMES TWO USING BILATERAL INTERNAL MAMMARY ARTERIES;  Surgeon: Gaye Pollack, MD;  Location: [facility];  Service: Open Heart Surgery;  Laterality: N/A;  BIMA  . CORONARY BALLOON ANGIOPLASTY N/A 09/14/2020   Procedure: CORONARY BALLOON ANGIOPLASTY;  Surgeon: Troy Sine, MD;  Location: [facility];  Service: Cardiovascular;  Laterality: N/A;  . CORONARY STENT PLACEMENT    . LEFT HEART CATH AND CORONARY ANGIOGRAPHY N/A 09/14/2020   Procedure: LEFT HEART CATH AND CORONARY ANGIOGRAPHY;  Surgeon: Troy Sine, MD;  Location: [facility];  Service: Cardiovascular;  Laterality: N/A;  . LEFT HEART CATH AND CORONARY ANGIOGRAPHY N/A 11/19/2020   Procedure: LEFT HEART CATH AND CORONARY ANGIOGRAPHY;  Surgeon: Troy Sine, MD;  Location: [facility];  Service: Cardiovascular;  Laterality: N/A;  . PACEMAKER INSERTION    . TEE WITHOUT CARDIOVERSION N/A 11/26/2020   Procedure: TRANSESOPHAGEAL ECHOCARDIOGRAM (TEE);  Surgeon: Gaye Pollack, MD;  Location: [facility];  Service: Open Heart Surgery;  Laterality: N/A;    Current Outpatient Medications  Medication Sig Dispense Refill  . acetaminophen (TYLENOL) 325 MG tablet Take 2 tablets (650 mg total) by mouth  every 6 (six) hours as needed for mild pain.    Marland Kitchen amiodarone (PACERONE) 100 MG tablet Take 1 tablet (100 mg total) by mouth daily. 30 tablet 12  . apixaban (ELIQUIS) 5 MG TABS tablet Take 1 tablet (5  mg total) by mouth 2 (two) times daily. 60 tablet 3  . atorvastatin (LIPITOR) 80 MG tablet Take 80 mg by mouth at bedtime.     . famotidine (PEPCID) 20 MG tablet TAKE ONE-HALF TABLET BY MOUTH 2 TIMES DAILY AS NEEDED FOR HEARTBURN OR INDIGESTION. 30 tablet 1  . metoprolol tartrate (LOPRESSOR) 25 MG tablet Take 1 tablet (25 mg total) by mouth 2 (two) times daily. 180 tablet 1  . prasugrel (EFFIENT) 10 MG TABS tablet Take 1 tablet (10 mg total) by mouth daily. 90 tablet 3   No current facility-administered medications for this encounter.    No Known Allergies  Social History   Socioeconomic History  . Marital status: Legally Separated    Spouse name: Not on file  . Number of children: Not on file  . Years of education: Not on file  . Highest education level: Not on file  Occupational History  . Not on file  Tobacco Use  . Smoking status: Never Smoker  . Smokeless tobacco: Never Used  Substance and Sexual Activity  . Alcohol use: Never  . Drug use: Never  . Sexual activity: Not on file  Other Topics Concern  . Not on file  Social History Narrative  . Not on file   Social Determinants of Health   Financial Resource Strain: Not on file  Food Insecurity: Not on file  Transportation Needs: Not on file  Physical Activity: Not on file  Stress: Not on file  Social Connections: Not on file  Intimate Partner Violence: Not on file     ROS- All systems are reviewed and negative except as per the HPI above.  Physical Exam: Vitals:   01/31/21 1135  BP: 136/80  Pulse: (!) 54  Weight: 124.7 kg  Height: 6\' 1"  (1.854 m)    GEN- The patient is a well appearing obese male, alert and oriented x 3 today.   HEENT-head normocephalic, atraumatic, sclera clear, conjunctiva pink, hearing intact, trachea midline. Lungs- Clear to ausculation bilaterally, normal work of breathing Heart- Regular rate and rhythm, no murmurs, rubs or gallops  GI- soft, NT, ND, + BS Extremities- no clubbing,  cyanosis, or edema MS- no significant deformity or atrophy Skin- no rash or lesion Psych- euthymic mood, full affect Neuro- strength and sensation are intact   Wt Readings from Last 3 Encounters:  01/31/21 124.7 kg  01/03/21 123.4 kg  12/26/20 124.5 kg    EKG today demonstrates  SB, 1st degree AV block, RBBB Vent. rate 54 BPM PR interval 212 ms QRS duration 154 ms QT/QTc 492/466 ms  LHC 09/14/20 Multivessel diffuse stenting with multiple stents inserted from the ostium of the LAD to the distal third of the LAD with focal 50% ostial narrowing; stenting of the proximal to mid circumflex vessel with bifurcation stenting of the OM vessel with 99% stenosis with thrombus in the circumflex stent immediately after the takeoff of the stented OM vessel with TIMI 0-I flow down the distal circumflex; and diffuse stenting of the entire dominant RCA extending from the ostium to the PDA without significant restenosis.  Preserved global LV function  Very difficult intervention with initial plaque shifting into the OM vessel requiring ultimate PTCA of both the OM ostium as  well as the circumflex stent with Cutting Balloon intervention to the circumflex stent due to persistent intimal hyperplasia not resolved with noncompliant balloon dilatation.  Ultimate kissing balloon technique to the OM and circumflex with a residual narrowing in the OM at 20% and in the circumflex at  30% with resultant brisk TIMI-3 flow to the distal circumflex vessel supplying several additional vessels.   Echo 11/19/20 1. Left ventricular ejection fraction, by estimation, is 55 to 60%. The  left ventricle has normal function. The left ventricle has no regional  wall motion abnormalities. There is mild concentric left ventricular  hypertrophy. Left ventricular diastolic  parameters are consistent with Grade I diastolic dysfunction (impaired  relaxation).  2. Right ventricular systolic function is normal. The right  ventricular  size is normal.  3. The mitral valve is normal in structure. No evidence of mitral valve  regurgitation. No evidence of mitral stenosis.  4. The aortic valve is tricuspid. Aortic valve regurgitation is not  visualized. No aortic stenosis is present.  5. Aortic dilatation noted. There is mild dilatation at the level of the  sinuses of Valsalva, measuring 39 mm.  6. The inferior vena cava is normal in size with greater than 50%  respiratory variability, suggesting right atrial pressure of 3 mmHg.    Epic records are reviewed at length today  CHA2DS2-VASc Score = 3  The patient's score is based upon: CHF History: No HTN History: Yes Diabetes History: No Stroke History: No Vascular Disease History: Yes Age Score: 1 Gender Score: 0      ASSESSMENT AND PLAN: 1. Paroxysmal Atrial Fibrillation (ICD10:  I48.0) The patient's CHA2DS2-VASc score is 3, indicating a 3.2% annual risk of stroke.   Patient appears to be maintaining SR.  Will have patient send a manual device transmission today. If no afib seen on device, will stop amiodarone 100 mg daily.  Patient currently on DAPT. D/w Dr Harrell Gave. Will stop ASA and start Eliquis 5 mg BID given his CV score of 3.   Continue Toprol 25 mg daily.  2. Secondary Hypercoagulable State (ICD10:  D68.69) The patient is at significant risk for stroke/thromboembolism based upon his CHA2DS2-VASc Score of 3.  See plans above.  3. Obesity Body mass index is 36.28 kg/m. Lifestyle modification was discussed and encouraged including regular physical activity and weight reduction.  4. CAD S/p CABG with Dr Cyndia Bent 11/26/20. No anginal symptoms.  5. HTN Stable, no changes today.  6. PPM Followed by Dr Caryl Comes and the device clinic.   Follow up with Dr Harrell Gave as scheduled and Dr Caryl Comes per recall.    [facility] [address] [place], [place] [address] [phone] 01/31/2021 11:59  AM

## 2021-02-01 ENCOUNTER — Telehealth: Payer: Self-pay | Admitting: Emergency Medicine

## 2021-02-01 NOTE — Telephone Encounter (Signed)
LVM for pt requesting he send transmission or callback if he needs assistance.  Device clinic # and hours provided.

## 2021-02-01 NOTE — Telephone Encounter (Signed)
-----   Message from Juluis Mire, RN sent at 01/31/2021 12:04 PM EDT ----- Regarding: transmission Pt is going to send a transmission when he gets back home from our visit today -please forward results to ricky Thanks! Marzetta Board

## 2021-02-01 NOTE — Telephone Encounter (Signed)
Called patient but unable to leave message, Mailbox full. Need remote transmission to evaluate  And send results to [facility] at [facility].

## 2021-02-04 NOTE — Telephone Encounter (Signed)
Second attempt to reach patient.  No answer.  LVM that I am sending a mychart message with instructions.

## 2021-02-05 NOTE — Telephone Encounter (Signed)
Per Adline Peals PA pt can stop amiodarone. No afib noted on device since procedure in January. Left message for pt to return my call to inform.

## 2021-02-05 NOTE — Telephone Encounter (Signed)
Transmission received.  Presenting SR 66 bpm.  AT?AF Burden 2.3%.  Most recent AF episode 11/27/20.

## 2021-02-19 ENCOUNTER — Encounter (HOSPITAL_COMMUNITY): Payer: Self-pay

## 2021-02-20 ENCOUNTER — Telehealth: Payer: Self-pay

## 2021-02-20 NOTE — Telephone Encounter (Signed)
Spoke to patient and apt. Made in device clinic 02/21/21 for lead testing. Location, date and time confirmed with patient.

## 2021-02-20 NOTE — Telephone Encounter (Signed)
[facility] alert received for atrial undersensing (1 sensed P wave noted) on presenting rhythm. Device programmed DDD. Patient AP <1%. Sensing in October 2021 was 3 mV. Sensing measurement on 02/18/21 was 0.8 mV with a safety margin programmed at 0.75 mV.  Sensing decreased abruptly January 2022. CABG was performed 11/26/20. Consulted with Dionne Milo from [facility] and recommends patient coming into clinic for sensing and threshold testing on RA lead. Attempted to call patient to add onto [place], [place] schedule tomorrow, no answer. LMOVM.   Per Dionne Milo, industry will be present for assistance if needed.

## 2021-02-20 NOTE — Telephone Encounter (Signed)
Returning patients phone call.  No answer, LMOVM.

## 2021-02-20 NOTE — Telephone Encounter (Signed)
Pt left a message returning nurse call. He states for the nurse to give him a call back and he will try to answer.

## 2021-02-21 ENCOUNTER — Ambulatory Visit (INDEPENDENT_AMBULATORY_CARE_PROVIDER_SITE_OTHER): Payer: Medicare HMO | Admitting: Emergency Medicine

## 2021-02-21 ENCOUNTER — Other Ambulatory Visit: Payer: Self-pay

## 2021-02-21 DIAGNOSIS — I455 Other specified heart block: Secondary | ICD-10-CM | POA: Diagnosis not present

## 2021-02-21 LAB — CUP PACEART INCLINIC DEVICE CHECK
Brady Statistic RA Percent Paced: 1 % — CL
Brady Statistic RV Percent Paced: 1 % — CL
Date Time Interrogation Session: 20220407223404
Implantable Lead Implant Date: 20190322
Implantable Lead Implant Date: 20190322
Implantable Lead Location: 753859
Implantable Lead Location: 753860
Implantable Pulse Generator Implant Date: 20190322
Lead Channel Pacing Threshold Amplitude: 1 V
Lead Channel Pacing Threshold Amplitude: 1.25 V
Lead Channel Pacing Threshold Pulse Width: 0.5 ms
Lead Channel Pacing Threshold Pulse Width: 1 ms
Lead Channel Sensing Intrinsic Amplitude: 0.5 mV
Lead Channel Sensing Intrinsic Amplitude: 7.5 mV
Pulse Gen Model: 2272
Pulse Gen Serial Number: 8996724

## 2021-02-21 NOTE — Progress Notes (Signed)
Pacemaker check in clinic. Patient brought into device clinic for RA undersensing per recent alert. CABG was completed on 11/26/20. Noted after procedure abrupt drop in RA sensing.  RA/RV impedance trends stable with previous measurements. RA sensing is ranging around 1 mV and less. Today RA sensing was 0.5 mV. RA auto sense turned on and presenting now AS/VS with no undersensing noted. Ra threshold was initially 1.75 V @ 0.5 ms. RA threshold ran Unipolar Ring, noted noise on atrial lead. RA threshold ran unipolar tip, noise noted on RA lead. Last Ra threshold completed was 1.25 V @ 1.0 ms. Ra pulse width extend to 1.0 ms. Increased RA output from 2.0V to 2.5V.  RV sensing and threshold stable with previous measurement. Device programmed to maximize longevity. Device programmed at appropriate safety margins. Histogram distribution appropriate for patient activity level. Device programmed to optimize intrinsic conduction. Estimated longevity 9.1-12.6 years. Patient enrolled in remote follow-up 03/04/21. Patient education completed. Dionne Milo from [facility] assist with check of device and made all programming changes to the device.   Error in saving report. See scanned report. You may also refer back to phone note on 02/20/21 for the initial alert received.   See below for changes.  -A. Pulse amplitude programmed 2.5V -A. Pulse width Programmed at 1.0 ms -A. Sensitivity programmed Auto - Atrial Autosense programmed On -Atrial Max Sensitivity programmed 0.3 mV -Consecutive High Ventricular Rate Cycles programmed 20 -High V Rate EGM Max Duration programmed 1 minute -Magnet Response Episode Trigger Programmed Off -PMT episode Trigger Programmed Off

## 2021-03-04 ENCOUNTER — Ambulatory Visit (INDEPENDENT_AMBULATORY_CARE_PROVIDER_SITE_OTHER): Payer: Self-pay

## 2021-03-04 DIAGNOSIS — I480 Paroxysmal atrial fibrillation: Secondary | ICD-10-CM

## 2021-03-06 LAB — CUP PACEART REMOTE DEVICE CHECK
Battery Remaining Longevity: 131 mo
Battery Remaining Percentage: 95.5 %
Battery Voltage: 2.99 V
Brady Statistic AP VP Percent: 1 %
Brady Statistic AP VS Percent: 1 %
Brady Statistic AS VP Percent: 1 %
Brady Statistic AS VS Percent: 99 %
Brady Statistic RA Percent Paced: 1 %
Brady Statistic RV Percent Paced: 1 %
Date Time Interrogation Session: 20220419120627
Implantable Lead Implant Date: 20190322
Implantable Lead Implant Date: 20190322
Implantable Lead Location: 753859
Implantable Lead Location: 753860
Implantable Pulse Generator Implant Date: 20190322
Lead Channel Impedance Value: 410 Ohm
Lead Channel Impedance Value: 460 Ohm
Lead Channel Pacing Threshold Amplitude: 1 V
Lead Channel Pacing Threshold Amplitude: 1.25 V
Lead Channel Pacing Threshold Pulse Width: 0.5 ms
Lead Channel Pacing Threshold Pulse Width: 1 ms
Lead Channel Sensing Intrinsic Amplitude: 0.6 mV
Lead Channel Sensing Intrinsic Amplitude: 6.8 mV
Lead Channel Setting Pacing Amplitude: 2.5 V
Lead Channel Setting Pacing Amplitude: 2.5 V
Lead Channel Setting Pacing Pulse Width: 0.5 ms
Lead Channel Setting Sensing Sensitivity: 2.5 mV
Pulse Gen Model: 2272
Pulse Gen Serial Number: 8996724

## 2021-03-21 NOTE — Progress Notes (Signed)
Remote pacemaker transmission.   

## 2021-04-01 NOTE — Progress Notes (Signed)
Cardiology Office Note:    Date:  04/03/2021   ID:  Ryan Clarke, DOB May 23, 1956, MRN [id number]  PCP:  Ryan Button, MD  Cardiologist:  Ryan Dresser, MD  EP: Dr. Caryl Clarke  CC: follow up  History of Present Illness:    Ryan Clarke is a 65 y.o. male with a hx of CAD with many stents remotely, now s/p CABGx2 11/2020, pacemaker who is seen for close follow up. I initially met him 08/14/20 as a referral for palpitations, but on discussion discovered extensive CAD history and angina  Cardiac history: Had all of his prior cardiac care as part of [facility] in [place]. Not available in Care Everywhere. Reports history of 29 stents, last stent 06/2019. Moved to [place] in 05/2020. S/P CABGx2 by Dr. Cyndia Bent on 05/17/15, complicated by post op atrial fibrillation. Due to recurrence of paroxysmal atrial fib, continued on anticoagulation.  Today: He is accompanied by his wife. He is feeling good overall. His sternal scar on his central chest has pruritis that bothers him regularly. He also notes his left central chest is constantly numb. Also, a few months after his cardiac surgery he began feeling knee pains again. He no longer notices the dizziness since his prior visit. His at home blood pressure is averaging in the 130s-140s. He denies any chest pain, shortness of breath, palpitations, or exertional symptoms. No headaches, lightheadedness, or syncope to report. Also has no lower extremity edema, orthopnea or PND.   For diet, his wife reports two instances of him feeling like he is "blocked" in his chest after eating several helpings at one meal. His wife constantly reminds him to stay hydrated. She also cooks thin portions of meat. For activity, he walks a lot and walks the dog, and is assisting his daughter building a restroom. He does not have exertional symptoms.  At this visit, he began bleeding from the back of his right hand, unknown cause but likely due to a scab or scratch.  Amiodarone is now  discontinued from his regimen and he is tolerating the change.  Past Medical History:  Diagnosis Date  . Carpal tunnel syndrome 09/10/2020  . Coronary artery disease   . Hypertension     Past Surgical History:  Procedure Laterality Date  . CORONARY ARTERY BYPASS GRAFT N/A 11/26/2020   Procedure: CORONARY ARTERY BYPASS GRAFTING (CABG) TIMES TWO USING BILATERAL INTERNAL MAMMARY ARTERIES;  Surgeon: Ryan Pollack, MD;  Location: [facility];  Service: Open Heart Surgery;  Laterality: N/A;  BIMA  . CORONARY BALLOON ANGIOPLASTY N/A 09/14/2020   Procedure: CORONARY BALLOON ANGIOPLASTY;  Surgeon: Ryan Sine, MD;  Location: [facility];  Service: Cardiovascular;  Laterality: N/A;  . CORONARY STENT PLACEMENT    . LEFT HEART CATH AND CORONARY ANGIOGRAPHY N/A 09/14/2020   Procedure: LEFT HEART CATH AND CORONARY ANGIOGRAPHY;  Surgeon: Ryan Sine, MD;  Location: [facility];  Service: Cardiovascular;  Laterality: N/A;  . LEFT HEART CATH AND CORONARY ANGIOGRAPHY N/A 11/19/2020   Procedure: LEFT HEART CATH AND CORONARY ANGIOGRAPHY;  Surgeon: Ryan Sine, MD;  Location: [facility];  Service: Cardiovascular;  Laterality: N/A;  . PACEMAKER INSERTION    . TEE WITHOUT CARDIOVERSION N/A 11/26/2020   Procedure: TRANSESOPHAGEAL ECHOCARDIOGRAM (TEE);  Surgeon: Ryan Pollack, MD;  Location: [facility];  Service: Open Heart Surgery;  Laterality: N/A;    Current Medications: Current Outpatient Medications on File Prior to Visit  Medication Sig  . acetaminophen (TYLENOL) 325  MG tablet Take 2 tablets (650 mg total) by mouth every 6 (six) hours as needed for mild pain.  Marland Kitchen apixaban (ELIQUIS) 5 MG TABS tablet Take 1 tablet (5 mg total) by mouth 2 (two) times daily.  Marland Kitchen atorvastatin (LIPITOR) 80 MG tablet Take 80 mg by mouth at bedtime.   . famotidine (PEPCID) 20 MG tablet TAKE ONE-HALF TABLET BY MOUTH 2 TIMES DAILY AS NEEDED FOR HEARTBURN OR INDIGESTION.  . metoprolol tartrate (LOPRESSOR) 25  MG tablet Take 1 tablet (25 mg total) by mouth 2 (two) times daily.  . prasugrel (EFFIENT) 10 MG TABS tablet Take 1 tablet (10 mg total) by mouth daily.   No current facility-administered medications on file prior to visit.     Allergies:   Patient has no known allergies.   Social History   Tobacco Use  . Smoking status: Never Smoker  . Smokeless tobacco: Never Used  Substance Use Topics  . Alcohol use: Never  . Drug use: Never    Family History: No premature history of CAD  ROS:   Please see the history of present illness.   (+) Central chest scar with pruritis (+) Left central chest numbness (+) Bleeding, right hand (+) Right knee pain Additional pertinent ROS otherwise unremarkable   EKGs/Labs/Other Studies Reviewed:    The following studies were reviewed today:  TEE 11/26/2020: POST-OP IMPRESSIONS  - Left Ventricle: The left ventricle is unchanged from pre-bypass.  - Right Ventricle: The right ventricle appears unchanged from pre-bypass.  - Aorta: The aorta appears unchanged from pre-bypass.  - Left Atrium: The left atrium appears unchanged from pre-bypass.  - Left Atrial Appendage: The left atrial appendage appears unchanged from  pre-bypass.  - Aortic Valve: The aortic valve appears unchanged from pre-bypass.  - Mitral Valve: The mitral valve appears unchanged from pre-bypass.  - Tricuspid Valve: The tricuspid valve appears unchanged from pre-bypass.  - Interatrial Septum: The interatrial septum appears unchanged from  pre-bypass.  - Interventricular Septum: The interventricular septum appears unchanged  from  pre-bypass.  - Pericardium: The pericardium appears unchanged from pre-bypass.  - Comments: post bypass EF similar to pre surgery  50-55%  No RWMA.   Pre CABG Carotid Doppler 11/20/2020: Summary:  Right Carotid: Velocities in the right ICA are consistent with a 1-39%  stenosis.   Left Carotid: Velocities in the left ICA are consistent with a 1-39%   stenosis.   Right ABI: Resting right ankle-brachial index is within normal range. No  evidence of significant right lower extremity arterial disease.  Left ABI: Resting left ankle-brachial index is within normal range. No  evidence of significant left lower extremity arterial disease.  Right Upper Extremity: Doppler waveforms remain within normal limits with  right radial compression. Doppler waveforms remain within normal limits  with right ulnar compression.  Left Upper Extremity: Doppler waveforms remain within normal limits with  left radial compression. Doppler waveforms remain within normal limits  with left ulnar compression.   Echo 11/19/2020: 1. Left ventricular ejection fraction, by estimation, is 55 to 60%. The  left ventricle has normal function. The left ventricle has no regional  wall motion abnormalities. There is mild concentric left ventricular  hypertrophy. Left ventricular diastolic  parameters are consistent with Grade I diastolic dysfunction (impaired  relaxation).  2. Right ventricular systolic function is normal. The right ventricular  size is normal.  3. The mitral valve is normal in structure. No evidence of mitral valve  regurgitation. No evidence of mitral stenosis.  4. The aortic valve is tricuspid. Aortic valve regurgitation is not  visualized. No aortic stenosis is present.  5. Aortic dilatation noted. There is mild dilatation at the level of the  sinuses of Valsalva, measuring 39 mm.  6. The inferior vena cava is normal in size with greater than 50%  respiratory variability, suggesting right atrial pressure of 3 mmHg.   LHC 11/19/2020:  1st Mrg lesion is 40% stenosed.  Prox Cx to Mid Cx lesion is 80% stenosed.  Non-stenotic RPAV lesion was previously treated.  Ost RCA to Dist RCA lesion is 5% stenosed.  Dist LM to Ost LAD lesion is 80% stenosed.  Mid LAD lesion is 60% stenosed with 60% stenosed side branch in 1st Sept.  Previously placed  2nd Diag stent (unknown type) is widely patent.   Multivessel CAD with stents in the LAD, diagonal, circumflex, circumflex marginal, and RCA extending to the PLA takeoff.  The LAD has progressive 80% ostial stenosis which seems to occur immediately proximal to the proximal stent.  There is diffuse 60% narrowing in the proximal and mid stented segment.  The diagonal stent is patent.  The distal portion of the mid LAD stent is patent.  Left circumflex stent extending from the ostium to the mid AV groove circumflex with focal eccentric 80% restenosis in the mid circumflex immediately after the takeoff of the marginal vessel.  There is 30 to 40% ostial narrowing in th marginal vessel which is stented ostially to proximally.  The RCA is diffusely stented from the ostium to the takeoff of the PLA vessel.  There is mild luminal irregularity.  Preserved global LV contractility with EF estimate at 18 mmHg.  RECOMMENDATION: The angiographic findings were reviewed with Dr. Gwenlyn Found in the catheterization laboratory.  With the ostial progression of 80% in the LAD as well as again restenosis in the circumflex vessel recommend surgical consultation for optimal long-term benefit.  We will hold prasugrel.  Increase anti-ischemic medications, optimal lipid management and BP control.   Cath 09/14/20  Previously placed RPAV stent (unknown type) is widely patent.  Ost RCA to Dist RCA lesion is 5% stenosed.  Previously placed Prox LAD to Dist LAD stent (unknown type) is widely patent.  Ost LAD lesion is 50% stenosed.  Prox Cx to Mid Cx lesion is 99% stenosed.  1st Mrg lesion is 30% stenosed.  Post intervention, there is a 30% residual stenosis.  Post intervention, there is a 20% residual stenosis.  The left ventricular systolic function is normal.  LV end diastolic pressure is normal.   Multivessel diffuse stenting with multiple stents inserted from the ostium of the LAD to the distal third of the  LAD with focal 50% ostial narrowing; stenting of the proximal to mid circumflex vessel with bifurcation stenting of the OM vessel with 99% stenosis with thrombus in the circumflex stent immediately after the takeoff of the stented OM vessel with TIMI 0-I flow down the distal circumflex; and diffuse stenting of the entire dominant RCA extending from the ostium to the PDA without significant restenosis.  Preserved global LV function  Very difficult intervention with initial plaque shifting into the OM vessel requiring ultimate PTCA of both the OM ostium as well as the circumflex stent with Cutting Balloon intervention to the circumflex stent due to persistent intimal hyperplasia not resolved with noncompliant balloon dilatation.  Ultimate kissing balloon technique to the OM and circumflex with a residual narrowing in the OM at 20% and in the circumflex at  30% with resultant brisk TIMI-3 flow to the distal circumflex vessel supplying several additional vessels.  RECOMMENDATION: Long-term DAPT indefinitely due to the extensive stenting in this patient who previously had 29 stents placed in all of his major coronary arteries.  Aggressive lipid-lowering therapy with target LDL in the 50s or below with optimal blood pressure control.   EKG:    04/03/2021: sinus bradycardia at 51 bpm, 1st degree AV block, RBBB, prior inferior infarct 10/01/2020: sinus bradycardia/sinus arrhythma, RBBB, 1st degree AV block, prior inferior infarct  Recent Labs: 11/17/2020: B Natriuretic Peptide 31.5 11/25/2020: ALT 88 11/27/2020: Magnesium 2.1 11/28/2020: BUN 9; Creatinine, Ser 0.85; Hemoglobin 13.5; Platelets 126; Potassium 3.7; Sodium 136  Recent Lipid Panel    Component Value Date/Time   CHOL 96 (L) 08/17/2020 1339   TRIG 174 (H) 08/17/2020 1339   HDL 32 (L) 08/17/2020 1339   CHOLHDL 3.0 08/17/2020 1339   LDLCALC 35 08/17/2020 1339   LDLDIRECT 33 08/17/2020 1339    Physical Exam:    VS:  BP (!) 142/88   Pulse  (!) 51   Ht 6' 1"  (1.854 m)   Wt 275 lb 3.2 oz (124.8 kg)   SpO2 96%   BMI 36.31 kg/m     Wt Readings from Last 3 Encounters:  04/03/21 275 lb 3.2 oz (124.8 kg)  01/31/21 275 lb (124.7 kg)  01/03/21 272 lb (123.4 kg)    GEN: Well nourished, well developed in no acute distress HEENT: Normal, moist mucous membranes NECK: No JVD CARDIAC: regular rhythm, normal S1 and S2, no rubs or gallops. No murmur. Well healed sternal site without erythema, warmth, or drainage VASCULAR: Radial and DP pulses 2+ bilaterally. No carotid bruits RESPIRATORY:  Clear to auscultation without rales, wheezing or rhonchi  ABDOMEN: Soft, non-tender, non-distended MUSCULOSKELETAL:  Ambulates independently SKIN: Warm and dry, no edema NEUROLOGIC:  Alert and oriented x 3. No focal neuro deficits noted. PSYCHIATRIC:  Normal affect   ASSESSMENT:    1. Coronary artery disease involving native coronary artery of native heart without angina pectoris   2. Primary hypertension   3. Paroxysmal atrial fibrillation (HCC)   4. Pacemaker - STJ   5. Secondary hypercoagulable state ([facility])   6. S/P CABG x 2   7. Medication management    PLAN:    Coronary artery disease, with reported 29 prior stents in [place] (no records), now s/p 2V CABG 11/2020 -no recent angina -instructed on red flag warning signs that need immediate medical attention -continue prasugrel. Recommendation given heavy stenting had been for DAPT indefinitely, but as he is not on apixaban aspirin was stopped. Will continue to address prasugrel + apixaban vs. Change to clopidogrel at follow up  -continue atorvastatin -continue metoprolol. Consider imdur, amlodipine if additional anti-anginals needed  S/P Dual chamber [employer] pacemaker -established with device clinic  Paroxysmal atrial fibrillation: sinus bradycardia today -discussed risk of stroke vs. Bleeding with patient -CHA2DS2/VAS Stroke Risk Points=3 -now on prasugrel and apixaban. Will  continue to follow the data re: antiplatelet and anticoagulation recommendations  Hypertension: BP elevated today Above goal today, starting chlorthalidone today, recheck BMET in several weeks. Had prior leg swelling on amlodipine. Also discussed ARB today -continue metoprolol  Hyperlipidemia: -LDL goal <70, LDL 33 on recent lipids -continue atorvastatin -TG elevated, unclear if fasting  Cardiac risk counseling and prevention recommendations: -recommend heart healthy/Mediterranean diet, with whole grains, fruits, vegetable, fish, lean meats, nuts, and olive oil. Limit salt. -recommend moderate walking, 3-5 times/week for  30-50 minutes each session. Aim for at least 150 minutes.week. Goal should be pace of 3 miles/hours, or walking 1.5 miles in 30 minutes -recommend avoidance of tobacco products. Avoid excess alcohol.  Plan for follow up: 6 mos or sooner as needed  Ryan Dresser, MD, PhD [facility] [place]  [facility]    Medication Adjustments/Labs and Tests Ordered: Current medicines are reviewed at length with the patient today.  Concerns regarding medicines are outlined above.  Orders Placed This Encounter  Procedures  . Basic metabolic panel  . EKG 12-Lead   Meds ordered this encounter  Medications  . chlorthalidone (HYGROTON) 25 MG tablet    Sig: Take 1 tablet (25 mg total) by mouth daily.    Dispense:  90 tablet    Refill:  3    Patient Instructions  Medication Instructions:  Starting chlorthalidone for blood pressure. Goal is <130/80. Avoid dehydration.  *If you need a refill on your cardiac medications before your next appointment, please call your pharmacy*   Lab Work: Your physician recommends that you return for kidney blood test in 2 weeks to monitor (BMP).    Testing/Procedures: None   Follow-Up: At [facility], you and your health needs are our priority.  As part of our continuing mission to provide you with exceptional heart care, we have  created designated Provider Care Teams.  These Care Teams include your primary Cardiologist (physician) and Advanced Practice Providers (APPs -  Physician Assistants and Nurse Practitioners) who all work together to provide you with the care you need, when you need it.  We recommend signing up for the patient portal called "MyChart".  Sign up information is provided on this After Visit Summary.  MyChart is used to connect with patients for Virtual Visits (Telemedicine).  Patients are able to view lab/test results, encounter notes, upcoming appointments, etc.  Non-urgent messages can be sent to your provider as well.   To learn more about what you can do with MyChart, go to [email or web address].    Your next appointment:   6 month(s) @[address] [place], [place] [address]   The format for your next appointment:   In Person  Provider:   Buford Dresser, MD       [facility],acting as a scribe for Ryan Dresser, MD.,have documented all relevant documentation on the behalf of Ryan Dresser, MD,as directed by  Ryan Dresser, MD while in the presence of Ryan Dresser, MD.  I, Ryan Dresser, MD, have reviewed all documentation for this visit. The documentation on 04/03/21 for the exam, diagnosis, procedures, and orders are all accurate and complete.  Signed, Ryan Dresser, MD PhD 04/03/2021   [facility]

## 2021-04-03 ENCOUNTER — Ambulatory Visit: Payer: Medicare HMO | Admitting: Cardiology

## 2021-04-03 ENCOUNTER — Encounter: Payer: Self-pay | Admitting: Cardiology

## 2021-04-03 ENCOUNTER — Other Ambulatory Visit: Payer: Self-pay

## 2021-04-03 VITALS — BP 142/88 | HR 51 | Ht 73.0 in | Wt 275.2 lb

## 2021-04-03 DIAGNOSIS — Z951 Presence of aortocoronary bypass graft: Secondary | ICD-10-CM

## 2021-04-03 DIAGNOSIS — I10 Essential (primary) hypertension: Secondary | ICD-10-CM | POA: Diagnosis not present

## 2021-04-03 DIAGNOSIS — I480 Paroxysmal atrial fibrillation: Secondary | ICD-10-CM

## 2021-04-03 DIAGNOSIS — Z79899 Other long term (current) drug therapy: Secondary | ICD-10-CM

## 2021-04-03 DIAGNOSIS — Z950 Presence of cardiac pacemaker: Secondary | ICD-10-CM | POA: Diagnosis not present

## 2021-04-03 DIAGNOSIS — D6869 Other thrombophilia: Secondary | ICD-10-CM

## 2021-04-03 DIAGNOSIS — I2510 Atherosclerotic heart disease of native coronary artery without angina pectoris: Secondary | ICD-10-CM

## 2021-04-03 MED ORDER — CHLORTHALIDONE 25 MG PO TABS: 25.0000 mg | ORAL_TABLET | Freq: Every day | ORAL | 3 refills | Status: DC

## 2021-04-03 NOTE — Assessment & Plan Note (Signed)
No angina or shortness of breath.

## 2021-04-03 NOTE — Assessment & Plan Note (Signed)
CHA2DS2/VAS Stroke Risk Points=3  -continue apixaban

## 2021-04-03 NOTE — Patient Instructions (Addendum)
Medication Instructions:  Starting chlorthalidone for blood pressure. Goal is <130/80. Avoid dehydration.  *If you need a refill on your cardiac medications before your next appointment, please call your pharmacy*   Lab Work: Your physician recommends that you return for kidney blood test in 2 weeks to monitor (BMP).    Testing/Procedures: None   Follow-Up: At [facility], you and your health needs are our priority.  As part of our continuing mission to provide you with exceptional heart care, we have created designated Provider Care Teams.  These Care Teams include your primary Cardiologist (physician) and Advanced Practice Providers (APPs -  Physician Assistants and Nurse Practitioners) who all work together to provide you with the care you need, when you need it.  We recommend signing up for the patient portal called "MyChart".  Sign up information is provided on this After Visit Summary.  MyChart is used to connect with patients for Virtual Visits (Telemedicine).  Patients are able to view lab/test results, encounter notes, upcoming appointments, etc.  Non-urgent messages can be sent to your provider as well.   To learn more about what you can do with MyChart, go to [email or web address].    Your next appointment:   6 month(s) @[address], [place] [address]   The format for your next appointment:   In Person  Provider:   Buford Dresser, MD

## 2021-04-03 NOTE — Assessment & Plan Note (Signed)
Above goal today, starting chlorthalidone today, recheck BMET in several weeks. Had prior leg swelling on amlodipine. Also discussed ARB today.

## 2021-04-22 LAB — BASIC METABOLIC PANEL
BUN/Creatinine Ratio: 15 (ref 10–24)
BUN: 16 mg/dL (ref 8–27)
CO2: 23 mmol/L (ref 20–29)
Calcium: 10.2 mg/dL (ref 8.6–10.2)
Chloride: 98 mmol/L (ref 96–106)
Creatinine, Ser: 1.04 mg/dL (ref 0.76–1.27)
Glucose: 99 mg/dL (ref 65–99)
Potassium: 4.1 mmol/L (ref 3.5–5.2)
Sodium: 134 mmol/L (ref 134–144)
eGFR: 80 mL/min/{1.73_m2} (ref >59)

## 2021-04-26 ENCOUNTER — Ambulatory Visit (INDEPENDENT_AMBULATORY_CARE_PROVIDER_SITE_OTHER): Payer: Medicare HMO | Admitting: Family Medicine

## 2021-04-26 ENCOUNTER — Encounter: Payer: Self-pay | Admitting: Family Medicine

## 2021-04-26 ENCOUNTER — Ambulatory Visit (INDEPENDENT_AMBULATORY_CARE_PROVIDER_SITE_OTHER): Payer: Medicare HMO

## 2021-04-26 ENCOUNTER — Other Ambulatory Visit: Payer: Self-pay

## 2021-04-26 VITALS — BP 111/60 | HR 59 | Ht 73.0 in | Wt 276.2 lb

## 2021-04-26 DIAGNOSIS — M25561 Pain in right knee: Secondary | ICD-10-CM

## 2021-04-26 DIAGNOSIS — I2510 Atherosclerotic heart disease of native coronary artery without angina pectoris: Secondary | ICD-10-CM | POA: Diagnosis not present

## 2021-04-26 DIAGNOSIS — Z23 Encounter for immunization: Secondary | ICD-10-CM

## 2021-04-26 MED ORDER — DICLOFENAC SODIUM 1 % EX GEL: 4.0000 g | Freq: Four times a day (QID) | CUTANEOUS | 3 refills | Status: DC

## 2021-04-26 NOTE — Progress Notes (Signed)
SUBJECTIVE:   CHIEF COMPLAINT / HPI:   Ryan Clarke is a very pleasant 65 year old gentleman with history significant for coronary artery disease status post multiple stents and coronary artery bypass, atrial fibrillation, and remote history of left knee injury due to fracture of L tibia presented today with right knee pain.  The patient reports a month-long history of lateral and anterior right knee pain at night.  He reports this starts later in the day and then is worse when he rolls over on his side at night.  He has tried nothing for this.  He denies calf pain, lower extremity edema, joint erythema, joint trauma, joint swelling or prior injury to the right knee.  He denies locking catching clicking or popping.  He denies right hip or back pain.  Of note the patient takes prasugrel and apixaban for coronary disease and atrial fibrillation respectively.   The patient reports intermittent dry scalp.  He denies flaking.  He uses sensitive cleanser.  He does not use sunscreen or wear hat typically when he is outside gardening.  He does report some skin changes in the past and he is required several skin excisions.  He reports a new lesion on his left forearm which keeps ulcerating and bleeding over time he is interested in getting this removed.  PERTINENT  PMH / PSH/Family/Social History : Updated and reviewed as appropriate.  OBJECTIVE:   BP 111/60   Pulse (!) 59   Ht 6\' 1"  (1.854 m)   Wt 276 lb 3.2 oz (125.3 kg)   SpO2 94%   BMI 36.44 kg/m   Today's weight:  Last Weight  Most recent update: 04/26/2021  9:43 AM    Weight  125.3 kg (276 lb 3.2 oz)            Review of prior weights: [employer]   04/26/21 0942  Weight: 276 lb 3.2 oz (125.3 kg)    Cardiac exam regular rate and rhythm no murmurs rubs or gallops lung exam is clear.  On his scalp he has no flaking he does have erythema of his scalp and loss of hair and male pattern baldness.Marland Kitchen  His left forearm he has a small 24  cm pink appearing papule with some overlying white scale  Bilateral knees examined his left knee has a scar on the lateral aspect left knee with full range of motion no crepitus no tenderness on medial or lateral joint line or along the joint itself.  The right knee has a small amount of edema as compared to the left there is no erythema.  There does appear to be a small effusion.  He has no medial joint line tenderness.  He has significant pain over his IT band and superior aspect of his fibula.  There is no calf swelling, calves appear symmetric.  There is no posterior edema of the leg suggestive of a Baker's cyst. He has good range of motion of the right and left knee.  No crepitus of the right knee no locking catching or popping.  Negative Thessaly's test. Positive Ober on right  ASSESSMENT/PLAN:   Right Knee Pain given exam and tenderness palpation along the distal IT band and positive Ober test suspect this is an IT band pathology, differential also includes patellar arthritis, osteoarthritis or meniscal pathology although I think this is less likely given his exam.  I do not suspect a DVT given his symptomatology.  Recommended stretching of the IT band stretches were provided, Voltaren  gel to the area and a knee x-ray to evaluate for arthritis.  If this does not improve I suggest formal physical therapy which I discussed with him today.  Left forearm lesion, suspect this is an early squamous cell carcinoma or actinic keratoses, I think the latter is more likely.  Scheduled for dermatology clinic.  Discussed apixaban and proximal ago with Dr. Erin Hearing who recommended continuing as he would likely have a small procedure of a shave biopsy or potentially just cryotherapy to the area  Healthcare maintenance pneumococcal 20 given today.    Dorris Singh, [place]

## 2021-04-26 NOTE — Patient Instructions (Addendum)
It was wonderful to see you today.  Please bring ALL of your medications with you to every visit.   Today we talked about:  -- Right knee pain:  - I think this is due to your IT band  -Please go get x-rays at [facility] imaging  -- Try gel (voltaren) four time a day  --Follow up if this does not improve--if you want to see a PT let us know    - I scheduled you for a dermatology appointment for your arm skin change and skin check  -- Please wear a hat and sunscreen  - We will check your cholesterol today     Thank you for choosing [facility].   Please call [phone] with any questions about today's appointment.  Please be sure to schedule follow up at the front  desk before you leave today.   Dorris Singh, MD  Family Medicine

## 2021-04-27 LAB — LDL CHOLESTEROL, DIRECT: LDL Direct: 57 mg/dL (ref 0–99)

## 2021-05-01 ENCOUNTER — Encounter (HOSPITAL_COMMUNITY): Payer: Self-pay | Admitting: Physician Assistant

## 2021-05-01 ENCOUNTER — Other Ambulatory Visit: Payer: Self-pay

## 2021-05-01 ENCOUNTER — Telehealth: Payer: Self-pay | Admitting: Cardiology

## 2021-05-01 ENCOUNTER — Ambulatory Visit (HOSPITAL_COMMUNITY)
Admission: RE | Admit: 2021-05-01 | Discharge: 2021-05-01 | Disposition: A | Discharge status: Home / Self Care | Payer: Medicare HMO | Source: Ambulatory Visit | Attending: Physician Assistant | Admitting: Physician Assistant

## 2021-05-01 VITALS — BP 102/60 | HR 65 | Ht 73.0 in | Wt 275.4 lb

## 2021-05-01 DIAGNOSIS — Z950 Presence of cardiac pacemaker: Secondary | ICD-10-CM | POA: Diagnosis not present

## 2021-05-01 DIAGNOSIS — Z951 Presence of aortocoronary bypass graft: Secondary | ICD-10-CM | POA: Diagnosis not present

## 2021-05-01 DIAGNOSIS — Z955 Presence of coronary angioplasty implant and graft: Secondary | ICD-10-CM | POA: Diagnosis not present

## 2021-05-01 DIAGNOSIS — Z79899 Other long term (current) drug therapy: Secondary | ICD-10-CM | POA: Insufficient documentation

## 2021-05-01 DIAGNOSIS — D6869 Other thrombophilia: Secondary | ICD-10-CM | POA: Diagnosis not present

## 2021-05-01 DIAGNOSIS — I2510 Atherosclerotic heart disease of native coronary artery without angina pectoris: Secondary | ICD-10-CM | POA: Diagnosis not present

## 2021-05-01 DIAGNOSIS — Z7901 Long term (current) use of anticoagulants: Secondary | ICD-10-CM | POA: Insufficient documentation

## 2021-05-01 DIAGNOSIS — D751 Secondary polycythemia: Secondary | ICD-10-CM | POA: Insufficient documentation

## 2021-05-01 DIAGNOSIS — I480 Paroxysmal atrial fibrillation: Secondary | ICD-10-CM

## 2021-05-01 DIAGNOSIS — Z6836 Body mass index (BMI) 36.0-36.9, adult: Secondary | ICD-10-CM | POA: Insufficient documentation

## 2021-05-01 DIAGNOSIS — I10 Essential (primary) hypertension: Secondary | ICD-10-CM | POA: Diagnosis not present

## 2021-05-01 DIAGNOSIS — E669 Obesity, unspecified: Secondary | ICD-10-CM | POA: Insufficient documentation

## 2021-05-01 DIAGNOSIS — E785 Hyperlipidemia, unspecified: Secondary | ICD-10-CM | POA: Diagnosis not present

## 2021-05-01 NOTE — Telephone Encounter (Signed)
Reports taking a walk this morning felt nausea, chest discomfort and felt heart beating fast BP 108/92 & HR 68 @8 :27 am Says he sent in a manual transmission to see if he was in A-fib Denies active chest pain, sob or dizziness Advised that message would be sent to the device clinic so that transmission can be reviewed.

## 2021-05-01 NOTE — Telephone Encounter (Signed)
Called and spoke with patient, he is agreeable to appt today at 3:30 pm with Adline Peals, PA-C.

## 2021-05-01 NOTE — Progress Notes (Signed)
Primary Care Physician: [name], MD Primary Cardiologist: Dr Harrell Gave  Primary Electrophysiologist: Dr Caryl Comes Referring Physician: Device clinic/Dr Antwoin Lackey is a 65 y.o. male with a history of CAD s/p several stents, HTN, HLD, polycythemia, PPM, and paroxysmal atrial fibrillation who presents for consultation in the [facility]. The patient was initially diagnosed with atrial fibrillation 09/01/20 with the device clinic receiving and alert for an ongoing episode, overall rate controlled. Patient has a CHADS2VASC score of 3. He has been experiencing exertional chest discomfort over the last several weeks. He also had heart racing which corresponded to the afib detected on his device.   Patient underwent LHC on 09/14/20 which showed multivessel diffuse stenting, LAD with focal 50% ostial narrowing, OM vessel with 99% stenosis with thrombus in the circumflex stent immediately after the takeoff of the stented OM vessel. Angioplasty performed. Patient was admitted 11/16/20 with unstable angina and underwent CABG with Dr Cyndia Bent on 11/26/20. He did have some post operative afib and was started on amiodarone which converted him to SR.   On follow up today, patient reports that last night he started to have symptoms of palpitaitons and nausea. He sent a manual transmission to the device clinic who confirmed he was in afib. He is back in SR on presentation today and is feeling much better. He reports that he had his 4th COVID booster on 6/10 and had a low grade fever over the weekend. He also admits that he had a very high salt meal for dinner last evening. He denies any CP or SOB.   Today, he denies symptoms of chest pain, SOB, orthopnea, PND, lower extremity edema, dizziness, presyncope, syncope, snoring, daytime somnolence, bleeding, or neurologic sequela. The patient is tolerating medications without difficulties and is otherwise without complaint today.     Atrial Fibrillation Risk Factors:  he does not have symptoms or diagnosis of sleep apnea. he does not have a history of rheumatic fever.   he has a BMI of Body mass index is 36.33 kg/m.Marland Kitchen Filed Weights   05/01/21 1542  Weight: 124.9 kg     No family history on file.   Atrial Fibrillation Management history:  Previous antiarrhythmic drugs: amiodarone  Previous cardioversions: none Previous ablations: none CHADS2VASC score: 3 Anticoagulation history: none   Past Medical History:  Diagnosis Date   Carpal tunnel syndrome 09/10/2020   Coronary artery disease    Hypertension    Past Surgical History:  Procedure Laterality Date   CORONARY ARTERY BYPASS GRAFT N/A 11/26/2020   Procedure: CORONARY ARTERY BYPASS GRAFTING (CABG) TIMES TWO USING BILATERAL INTERNAL MAMMARY ARTERIES;  Surgeon: Gaye Pollack, MD;  Location: [facility];  Service: Open Heart Surgery;  Laterality: N/A;  BIMA   CORONARY BALLOON ANGIOPLASTY N/A 09/14/2020   Procedure: CORONARY BALLOON ANGIOPLASTY;  Surgeon: Troy Sine, MD;  Location: [facility];  Service: Cardiovascular;  Laterality: N/A;   CORONARY STENT PLACEMENT     LEFT HEART CATH AND CORONARY ANGIOGRAPHY N/A 09/14/2020   Procedure: LEFT HEART CATH AND CORONARY ANGIOGRAPHY;  Surgeon: Troy Sine, MD;  Location: [facility];  Service: Cardiovascular;  Laterality: N/A;   LEFT HEART CATH AND CORONARY ANGIOGRAPHY N/A 11/19/2020   Procedure: LEFT HEART CATH AND CORONARY ANGIOGRAPHY;  Surgeon: Troy Sine, MD;  Location: [facility];  Service: Cardiovascular;  Laterality: N/A;   PACEMAKER INSERTION     TEE WITHOUT CARDIOVERSION N/A 11/26/2020  Procedure: TRANSESOPHAGEAL ECHOCARDIOGRAM (TEE);  Surgeon: Gaye Pollack, MD;  Location: [facility];  Service: Open Heart Surgery;  Laterality: N/A;    Current Outpatient Medications  Medication Sig Dispense Refill   acetaminophen (TYLENOL) 325 MG tablet Take 2 tablets (650 mg total) by  mouth every 6 (six) hours as needed for mild pain.     apixaban (ELIQUIS) 5 MG TABS tablet Take 1 tablet (5 mg total) by mouth 2 (two) times daily. 60 tablet 3   atorvastatin (LIPITOR) 80 MG tablet Take 80 mg by mouth at bedtime.      chlorthalidone (HYGROTON) 25 MG tablet Take 1 tablet (25 mg total) by mouth daily. 90 tablet 3   diclofenac Sodium (VOLTAREN) 1 % GEL Apply 4 g topically 4 (four) times daily. 100 g 3   famotidine (PEPCID) 20 MG tablet TAKE ONE-HALF TABLET BY MOUTH 2 TIMES DAILY AS NEEDED FOR HEARTBURN OR INDIGESTION. 30 tablet 1   metoprolol tartrate (LOPRESSOR) 25 MG tablet Take 1 tablet (25 mg total) by mouth 2 (two) times daily. 180 tablet 1   prasugrel (EFFIENT) 10 MG TABS tablet Take 1 tablet (10 mg total) by mouth daily. 90 tablet 3   No current facility-administered medications for this encounter.    No Known Allergies  Social History   Socioeconomic History   Marital status: Legally Separated    Spouse name: Not on file   Number of children: Not on file   Years of education: Not on file   Highest education level: Not on file  Occupational History   Not on file  Tobacco Use   Smoking status: Never   Smokeless tobacco: Never  Substance and Sexual Activity   Alcohol use: Never   Drug use: Never   Sexual activity: Not on file  Other Topics Concern   Not on file  Social History Narrative   Not on file   Social Determinants of Health   Financial Resource Strain: Not on file  Food Insecurity: Not on file  Transportation Needs: Not on file  Physical Activity: Not on file  Stress: Not on file  Social Connections: Not on file  Intimate Partner Violence: Not on file     ROS- All systems are reviewed and negative except as per the HPI above.  Physical Exam: Vitals:   05/01/21 1542  BP: 102/60  Pulse: 65  Weight: 124.9 kg  Height: 6\' 1"  (1.854 m)     GEN- The patient is a well appearing obese male, alert and oriented x 3 today.   HEENT-head  normocephalic, atraumatic, sclera clear, conjunctiva pink, hearing intact, trachea midline. Lungs- Clear to ausculation bilaterally, normal work of breathing Heart- Regular rate and rhythm, no murmurs, rubs or gallops  GI- soft, NT, ND, + BS Extremities- no clubbing, cyanosis, or edema MS- no significant deformity or atrophy Skin- no rash or lesion Psych- euthymic mood, full affect Neuro- strength and sensation are intact   Wt Readings from Last 3 Encounters:  05/01/21 124.9 kg  04/26/21 125.3 kg  04/03/21 124.8 kg    EKG today demonstrates  SR, RBBB Vent. rate 65 BPM PR interval 204 ms QRS duration 152 ms QT/QTcB 466/484 ms  LHC 09/14/20 Multivessel diffuse stenting with multiple stents inserted from the ostium of the LAD to the distal third of the LAD with focal 50% ostial narrowing; stenting of the proximal to mid circumflex vessel with bifurcation stenting of the OM vessel with 99% stenosis with thrombus in the circumflex stent  immediately after the takeoff of the stented OM vessel with TIMI 0-I flow down the distal circumflex; and diffuse stenting of the entire dominant RCA extending from the ostium to the PDA without significant restenosis.   Preserved global LV function   Very difficult intervention with initial plaque shifting into the OM vessel requiring ultimate PTCA of both the OM ostium as well as the circumflex stent with Cutting Balloon intervention to the circumflex stent due to persistent intimal hyperplasia not resolved with noncompliant balloon dilatation.  Ultimate kissing balloon technique to the OM and circumflex with a residual narrowing in the OM at 20% and in the circumflex at  30% with resultant brisk TIMI-3 flow to the distal circumflex vessel supplying several additional vessels.   Echo 11/19/20 1. Left ventricular ejection fraction, by estimation, is 55 to 60%. The  left ventricle has normal function. The left ventricle has no regional  wall motion  abnormalities. There is mild concentric left ventricular  hypertrophy. Left ventricular diastolic  parameters are consistent with Grade I diastolic dysfunction (impaired  relaxation).   2. Right ventricular systolic function is normal. The right ventricular  size is normal.   3. The mitral valve is normal in structure. No evidence of mitral valve  regurgitation. No evidence of mitral stenosis.   4. The aortic valve is tricuspid. Aortic valve regurgitation is not  visualized. No aortic stenosis is present.   5. Aortic dilatation noted. There is mild dilatation at the level of the  sinuses of Valsalva, measuring 39 mm.   6. The inferior vena cava is normal in size with greater than 50%  respiratory variability, suggesting right atrial pressure of 3 mmHg.    Epic records are reviewed at length today  CHA2DS2-VASc Score = 3  The patient's score is based upon: CHF History: No HTN History: Yes Diabetes History: No Stroke History: No Vascular Disease History: Yes Age Score: 1 Gender Score: 0      ASSESSMENT AND PLAN: 1. Paroxysmal Atrial Fibrillation (ICD10:  I48.0) The patient's CHA2DS2-VASc score is 3, indicating a 3.2% annual risk of stroke.   Patient back in SR. ? Related to COVID booster combined with dietary indiscretion.  If he has frequent/prolonged symptomatic episodes, could consider AAD (Multaq, dofetilide) vs ablation. Would avoid long term amiodarone use given his young age.  Continue Eliquis 5 mg BID Continue Toprol 25 mg daily  2. Secondary Hypercoagulable State (ICD10:  D68.69) The patient is at significant risk for stroke/thromboembolism based upon his CHA2DS2-VASc Score of 3.  See plans above.  3. Obesity Body mass index is 36.33 kg/m. Lifestyle modification was discussed and encouraged including regular physical activity and weight reduction.  4. CAD S/p CABG with Dr Cyndia Bent 11/26/20. No anginal symptoms.  5. HTN Stable, no changes today.  6.  PPM Followed by Dr Caryl Comes and the device clinic.   Follow up with Dr Caryl Comes and Dr Harrell Gave per recalls.   [facility] [address] [place], [place] [address] [phone] 05/01/2021 4:12 PM

## 2021-05-01 NOTE — Telephone Encounter (Signed)
Patient c/o Palpitations:  High priority if patient c/o lightheadedness, shortness of breath, or chest pain  How long have you had palpitations/irregular HR/ Afib? Are you having the symptoms now? Currently in AFib  Are you currently experiencing lightheadedness, SOB or CP?  Yes   Do you have a history of afib (atrial fibrillation) or irregular heart rhythm? Yes   Have you checked your BP or HR? (document readings if available): NO  Are you experiencing any other symptoms?

## 2021-05-01 NOTE — Telephone Encounter (Signed)
Called patient after reviewing his transmission. Transmission showing A-fib. Patient was previously symptomatic with palpitations. No chest pain or shortness of breath currently. Patient states compliance with Eliquis 5 mg and other medications. Advised patient that I would route this to the A-fib Clinic. Sending to Dr. Caryl Comes just [place].

## 2021-05-05 NOTE — Patient Instructions (Addendum)
It was nice seeing you today!  Plan for shave biopsy at your next visit.  Please arrive at least 15 minutes prior to your scheduled appointments.  Stay well, Ryan Button, MD [place] [phone]

## 2021-05-05 NOTE — Progress Notes (Signed)
    SUBJECTIVE:   CHIEF COMPLAINT / HPI:   Skin lesions Recently saw Dr. Owens Shark 6/10 and reported lesion in left forearm which keeps ulcerating and bleeding over time. He was interested in getting this removed. He states lesion started a few years ago and will flare up every now and then with scaling and bleeding, last flare-up a few weeks ago. Also has a similar lesion on his right medial knee which also periodically ulcerates and bleeds. History of BCC requiring surgery. Is outside often but does not wear sunscreen or hat typically.  PERTINENT  PMH / PSH: basal cell carcinoma s/p surgery, A Fib on apixaban  OBJECTIVE:   BP 128/78   Pulse (!) 54   Ht 6\' 1"  (1.854 m)   Wt 275 lb (124.7 kg)   SpO2 98%   BMI 36.28 kg/m   General: Obese male, NAD HEENT: balding Derm: Erythematous macules with scale noted to left forearm, right knee, and back. See images: Left forearm  Right knee  Back       ASSESSMENT/PLAN:   Skin lesions Three distinct skin lesions to left forearm, right knee, and back concerning for AK vs SCC. Plan for shave biopsy to send to path to evaluate for malignancy. Patient amenable to scheduling separate visit for this. - scheduled 7/12 for shave biopsy - recommended sunscreen and wearing hat outdoors    [name], MD [place]

## 2021-05-07 ENCOUNTER — Ambulatory Visit
Admission: RE | Admit: 2021-05-07 | Discharge: 2021-05-07 | Disposition: A | Discharge status: Home / Self Care | Payer: Medicare HMO | Source: Ambulatory Visit | Attending: Family Medicine | Admitting: Family Medicine

## 2021-05-07 ENCOUNTER — Encounter: Payer: Self-pay | Admitting: Family Medicine

## 2021-05-07 ENCOUNTER — Other Ambulatory Visit: Payer: Self-pay

## 2021-05-07 ENCOUNTER — Ambulatory Visit (INDEPENDENT_AMBULATORY_CARE_PROVIDER_SITE_OTHER): Payer: Medicare HMO | Admitting: Family Medicine

## 2021-05-07 VITALS — BP 128/78 | HR 54 | Ht 73.0 in | Wt 275.0 lb

## 2021-05-07 DIAGNOSIS — L989 Disorder of the skin and subcutaneous tissue, unspecified: Secondary | ICD-10-CM | POA: Diagnosis not present

## 2021-05-07 DIAGNOSIS — Z85828 Personal history of other malignant neoplasm of skin: Secondary | ICD-10-CM

## 2021-05-07 DIAGNOSIS — I480 Paroxysmal atrial fibrillation: Secondary | ICD-10-CM | POA: Diagnosis not present

## 2021-05-07 DIAGNOSIS — M25561 Pain in right knee: Secondary | ICD-10-CM

## 2021-05-08 ENCOUNTER — Telehealth: Payer: Self-pay | Admitting: Family Medicine

## 2021-05-08 NOTE — Telephone Encounter (Signed)
Called with results of x-ray--confirmed with DOB and name.  His knee is feeling better.  He does have some mild chondrocalcinosis and osteoarthritis.  He reports his knee is better without any therapies.  Given this we will hold off on additional treatment or diagnostic testing at this time.  All questions were answered I told him if his knee flares he was to try topical therapy could consider PT.  Given chondrocalcinosis could consider dedicated therapy and evaluation of the joint itself.  All questions were answered.

## 2021-05-27 NOTE — Progress Notes (Signed)
    SUBJECTIVE:   CHIEF COMPLAINT / HPI:   Seen at last visit for three distinct skin lesions to the left forearm, right knee, and back concerning for AK vs SCC, scheduled today for shave biopsy. Reports he has been using sunscreen and wearing hats now.  PERTINENT  PMH / PSH: basal cell carcinoma s/p surgery, A Fib on apixaban  OBJECTIVE:   BP 109/84   Pulse 65   Ht 6\' 1"  (1.854 m)   Wt 277 lb (125.6 kg)   SpO2 94%   BMI 36.55 kg/m   General: Obese male, NAD Derm: Erythematous macules noted to left forearm, right knee, and back. Scaling noted to lesion on back. Pinpoint ulceration noted on left forearm lesion.  ASSESSMENT/PLAN:   Skin lesions Three distinct skin lesions, appear to be AK vs SCC. Will proceed with shave biopsy.   Shave Biopsy Procedure Note  Pre-operative Diagnosis: Actinic keratosis vs SCC  Post-operative Diagnosis: same  Locations:left forearm, right knee, and back  Indications: treatment and diagnosis, history of BCC  Anesthesia: Lidocaine 1% with epinephrine without added sodium bicarbonate  Procedure Details  History of allergy to iodine: no  Patient informed of the risks (including bleeding and infection) and benefits of the  procedure and Written informed consent obtained.  The lesion and surrounding area were given a sterile prep using alcohol and draped in the usual sterile fashion. A Dermablade was used to shave an area of skin on the left forearm, right knee, and back.  Hemostasis achieved with pressure and silver nitrate. Vaseline and an adhesive bandage applied to each site.  The specimens were sent for pathologic examination. The patient tolerated the procedure well.  EBL: miniml  Findings: pending  Condition: Stable  Complications: Oozing, hemostasis achieved with pressure and silver nitrate .  Plan: 1. Instructed to keep the wound dry and covered for 24-48h and clean thereafter. 2. Warning signs of infection were reviewed.    3. Recommended that the patient use OTC acetaminophen as needed for pain.  4. Will call with path results.  [name], MD [place]

## 2021-05-27 NOTE — Patient Instructions (Signed)
It was nice seeing you today!  I will update you with the pathology results when available.  Stay well, [name], MD [place] [phone]

## 2021-05-28 ENCOUNTER — Other Ambulatory Visit (HOSPITAL_COMMUNITY): Payer: Self-pay | Admitting: Physician Assistant

## 2021-05-28 ENCOUNTER — Ambulatory Visit (INDEPENDENT_AMBULATORY_CARE_PROVIDER_SITE_OTHER): Payer: Medicare HMO | Admitting: Family Medicine

## 2021-05-28 ENCOUNTER — Other Ambulatory Visit: Payer: Self-pay

## 2021-05-28 ENCOUNTER — Encounter: Payer: Self-pay | Admitting: Family Medicine

## 2021-05-28 VITALS — BP 109/84 | HR 65 | Ht 73.0 in | Wt 277.0 lb

## 2021-05-28 DIAGNOSIS — L989 Disorder of the skin and subcutaneous tissue, unspecified: Secondary | ICD-10-CM

## 2021-06-03 ENCOUNTER — Ambulatory Visit (INDEPENDENT_AMBULATORY_CARE_PROVIDER_SITE_OTHER): Payer: Self-pay

## 2021-06-03 DIAGNOSIS — I480 Paroxysmal atrial fibrillation: Secondary | ICD-10-CM

## 2021-06-04 LAB — CUP PACEART REMOTE DEVICE CHECK
Battery Remaining Longevity: 97 mo
Battery Remaining Percentage: 76 %
Battery Voltage: 2.99 V
Brady Statistic AP VP Percent: 1 %
Brady Statistic AP VS Percent: 1 %
Brady Statistic AS VP Percent: 1 %
Brady Statistic AS VS Percent: 98 %
Brady Statistic RA Percent Paced: 1 %
Brady Statistic RV Percent Paced: 1 %
Date Time Interrogation Session: 20220719015402
Implantable Lead Implant Date: 20190322
Implantable Lead Implant Date: 20190322
Implantable Lead Location: 753859
Implantable Lead Location: 753860
Implantable Pulse Generator Implant Date: 20190322
Lead Channel Impedance Value: 410 Ohm
Lead Channel Impedance Value: 530 Ohm
Lead Channel Pacing Threshold Amplitude: 1 V
Lead Channel Pacing Threshold Amplitude: 1.25 V
Lead Channel Pacing Threshold Pulse Width: 0.5 ms
Lead Channel Pacing Threshold Pulse Width: 1 ms
Lead Channel Sensing Intrinsic Amplitude: 0.5 mV
Lead Channel Sensing Intrinsic Amplitude: 7 mV
Lead Channel Setting Pacing Amplitude: 2.5 V
Lead Channel Setting Pacing Amplitude: 2.5 V
Lead Channel Setting Pacing Pulse Width: 0.5 ms
Lead Channel Setting Sensing Sensitivity: 2.5 mV
Pulse Gen Model: 2272
Pulse Gen Serial Number: 8996724

## 2021-06-05 ENCOUNTER — Telehealth: Payer: Self-pay

## 2021-06-05 DIAGNOSIS — D099 Carcinoma in situ, unspecified: Secondary | ICD-10-CM

## 2021-06-05 NOTE — Telephone Encounter (Signed)
Patient calls nurse line requesting to speak with provider regarding skin biopsy results. Patient was able to access dermatology report via mychart and would like to discuss next steps with provider.   Please advise.   Talbot Grumbling, RN

## 2021-06-06 NOTE — Telephone Encounter (Signed)
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 Referral placed for dermatology.

## 2021-06-06 NOTE — Addendum Note (Signed)
Addended by: [name] D on: 06/06/2021 12:46 PM   Modules accepted: Orders

## 2021-06-13 ENCOUNTER — Telehealth: Payer: Self-pay | Admitting: Dermatology

## 2021-06-13 NOTE — Telephone Encounter (Signed)
Patient is calling for a referral appointment from Yehuda Savannah, MD.  Patient is scheduled for 09/04/2021 at 8:30 with Lavonna Monarch, MD.

## 2021-06-13 NOTE — Telephone Encounter (Signed)
Referral attached to appointment

## 2021-06-26 NOTE — Progress Notes (Signed)
Remote pacemaker transmission.   

## 2021-07-19 ENCOUNTER — Telehealth: Payer: Self-pay | Admitting: Cardiology

## 2021-07-19 ENCOUNTER — Other Ambulatory Visit: Payer: Self-pay | Admitting: Cardiology

## 2021-07-19 DIAGNOSIS — E7800 Pure hypercholesterolemia, unspecified: Secondary | ICD-10-CM

## 2021-07-19 MED ORDER — ATORVASTATIN CALCIUM 80 MG PO TABS: 80.0000 mg | ORAL_TABLET | Freq: Every day | ORAL | 1 refills | Status: DC

## 2021-07-19 NOTE — Telephone Encounter (Signed)
Rx request sent to pharmacy.  

## 2021-07-19 NOTE — Telephone Encounter (Signed)
*  STAT* If patient is at the pharmacy, call can be transferred to refill team.   1. Which medications need to be refilled? (please list name of each medication and dose if known) atorvastatin (LIPITOR) 80 MG tablet   2. Which pharmacy/location (including street and city if local pharmacy) is medication to be sent to?  [facility] [place] [facility] [place]  3. Do they need a 30 day or 90 day supply? 90ds

## 2021-08-15 ENCOUNTER — Encounter: Payer: Self-pay | Admitting: Internal Medicine

## 2021-08-18 NOTE — Progress Notes (Signed)
Cardiology Office Note Date:  08/20/2021  Patient ID:  Ryan Clarke, Ryan Clarke 04/14/1956, MRN [id number] PCP:  [name], MD  Cardiologist:  Dr. Harrell Gave Electrophysiologist: Dr. Caryl Comes     Chief Complaint: 57mo visit  History of Present Illness: Ryan Clarke is a 65 y.o. male with history of CAD (multiple remote stents > CABG Jan 2022) Afib, HTN, HLD, polycythemia  He comes in today to be seen for Dr. Caryl Comes, last seen by him Oct 2021 to establish pacemaker management, moving from [place] then.  He last saw Dr. Harrell Gave May 2022, doing well given multiple prior stents planned to keep on DAPT indefinitely though with need for [facility] so far maintained on Effient and Eliquis  He saw R. Fenton, PA-C June 2022 with palpitations/nausea, Afib confirmed by device transmissio though converted back to SR by the time of his visit, timing lined up it seems with his COVID booster associated with low grade fever, likely a provoked exacerbation.No changes were made   TODAY He comes with his wife. He is doing well No CP, denies SOB No difficulties with ADLs No near syncope or syncope. He will infrequently feel a skipped/missing beat that give him a fleeting sense of feeling unwell. His wife will listen to his heart a will hear a missed beat as well.  They differ on frequency, the patient state infrequently, his wife says every couple weeks probably.  No bleeding or signs of bleeding, tolerating current medical therapy   Device information Abott dual chamber PPM implanted 02/05/2018   Afib hx Diagnosed Oct 2021 via his device  AAD hx None to date  Past Medical History:  Diagnosis Date   Carpal tunnel syndrome 09/10/2020   Coronary artery disease    Hypertension     Past Surgical History:  Procedure Laterality Date   CORONARY ARTERY BYPASS GRAFT N/A 11/26/2020   Procedure: CORONARY ARTERY BYPASS GRAFTING (CABG) TIMES TWO USING BILATERAL INTERNAL MAMMARY ARTERIES;  Surgeon:  Gaye Pollack, MD;  Location: [place];  Service: Open Heart Surgery;  Laterality: N/A;  BIMA   CORONARY BALLOON ANGIOPLASTY N/A 09/14/2020   Procedure: CORONARY BALLOON ANGIOPLASTY;  Surgeon: Troy Sine, MD;  Location: [place] [facility];  Service: Cardiovascular;  Laterality: N/A;   CORONARY STENT PLACEMENT     LEFT HEART CATH AND CORONARY ANGIOGRAPHY N/A 09/14/2020   Procedure: LEFT HEART CATH AND CORONARY ANGIOGRAPHY;  Surgeon: Troy Sine, MD;  Location: [place] [facility];  Service: Cardiovascular;  Laterality: N/A;   LEFT HEART CATH AND CORONARY ANGIOGRAPHY N/A 11/19/2020   Procedure: LEFT HEART CATH AND CORONARY ANGIOGRAPHY;  Surgeon: Troy Sine, MD;  Location: [place] [facility];  Service: Cardiovascular;  Laterality: N/A;   PACEMAKER INSERTION     TEE WITHOUT CARDIOVERSION N/A 11/26/2020   Procedure: TRANSESOPHAGEAL ECHOCARDIOGRAM (TEE);  Surgeon: Gaye Pollack, MD;  Location: [place];  Service: Open Heart Surgery;  Laterality: N/A;    Current Outpatient Medications  Medication Sig Dispense Refill   acetaminophen (TYLENOL) 325 MG tablet Take 2 tablets (650 mg total) by mouth every 6 (six) hours as needed for mild pain.     atorvastatin (LIPITOR) 80 MG tablet Take 1 tablet (80 mg total) by mouth at bedtime. 90 tablet 1   chlorthalidone (HYGROTON) 25 MG tablet Take 1 tablet (25 mg total) by mouth daily. 90 tablet 3   diclofenac Sodium (VOLTAREN) 1 % GEL Apply 4 g topically 4 (four) times daily. 100 g 3  ELIQUIS 5 MG TABS tablet TAKE 1 TABLET BY MOUTH TWICE A DAY 60 tablet 3   famotidine (PEPCID) 20 MG tablet TAKE ONE-HALF TABLET BY MOUTH 2 TIMES DAILY AS NEEDED FOR HEARTBURN OR INDIGESTION. 30 tablet 1   metoprolol tartrate (LOPRESSOR) 25 MG tablet TAKE 1 TABLET BY MOUTH TWICE A DAY 180 tablet 1   prasugrel (EFFIENT) 10 MG TABS tablet Take 1 tablet (10 mg total) by mouth daily. 90 tablet 3   No current facility-administered medications for this visit.    Allergies:    Patient has no known allergies.   Social History:  The patient  reports that he has never smoked. He has never used smokeless tobacco. He reports that he does not drink alcohol and does not use drugs.   Family History:  The patient's family history includes Coronary artery disease in his brother, brother, father, and mother; Liver cancer in his maternal grandmother.  ROS:  Please see the history of present illness.    All other systems are reviewed and otherwise negative.   PHYSICAL EXAM:  VS:  BP 116/84   Pulse (!) 58   Ht 6\' 1"  (1.854 m)   Wt 278 lb 3.2 oz (126.2 kg)   SpO2 95%   BMI 36.70 kg/m  BMI: Body mass index is 36.7 kg/m. Well nourished, well developed, in no acute distress HEENT: normocephalic, atraumatic Neck: no JVD, carotid bruits or masses Cardiac:  RRR; no significant murmurs, no rubs, or gallops Lungs:  CTA b/l, no wheezing, rhonchi or rales Abd: soft, nontender MS: no deformity or atrophy Ext: no edema, some varicose veins noted Skin: warm and dry, no rash Neuro:  No gross deficits appreciated Psych: euthymic mood, full affect  PPM site is stable, no tethering or discomfort   EKG:  not done today  Device interrogation done today and reviewed by myself:  Battery and lead measurements are good He rarely paces AP <1% VP 1.2% No AFib since known event in June One PMT that the algorithm worked for  TEE 11/26/2020: POST-OP IMPRESSIONS  - Left Ventricle: The left ventricle is unchanged from pre-bypass.  - Right Ventricle: The right ventricle appears unchanged from pre-bypass.  - Aorta: The aorta appears unchanged from pre-bypass.  - Left Atrium: The left atrium appears unchanged from pre-bypass.  - Left Atrial Appendage: The left atrial appendage appears unchanged from  pre-bypass.  - Aortic Valve: The aortic valve appears unchanged from pre-bypass.  - Mitral Valve: The mitral valve appears unchanged from pre-bypass.  - Tricuspid Valve: The tricuspid  valve appears unchanged from pre-bypass.  - Interatrial Septum: The interatrial septum appears unchanged from  pre-bypass.  - Interventricular Septum: The interventricular septum appears unchanged  from  pre-bypass.  - Pericardium: The pericardium appears unchanged from pre-bypass.  - Comments: post bypass EF similar to pre surgery  50-55%  No RWMA.    Pre CABG Carotid Doppler 11/20/2020: Summary:  Right Carotid: Velocities in the right ICA are consistent with a 1-39%  stenosis.   Left Carotid: Velocities in the left ICA are consistent with a 1-39%  stenosis.   Right ABI: Resting right ankle-brachial index is within normal range. No  evidence of significant right lower extremity arterial disease.  Left ABI: Resting left ankle-brachial index is within normal range. No  evidence of significant left lower extremity arterial disease.  Right Upper Extremity: Doppler waveforms remain within normal limits with  right radial compression. Doppler waveforms remain within normal limits  with right  ulnar compression.  Left Upper Extremity: Doppler waveforms remain within normal limits with  left radial compression. Doppler waveforms remain within normal limits  with left ulnar compression.    Echo 11/19/2020: 1. Left ventricular ejection fraction, by estimation, is 55 to 60%. The  left ventricle has normal function. The left ventricle has no regional  wall motion abnormalities. There is mild concentric left ventricular  hypertrophy. Left ventricular diastolic  parameters are consistent with Grade I diastolic dysfunction (impaired  relaxation).   2. Right ventricular systolic function is normal. The right ventricular  size is normal.   3. The mitral valve is normal in structure. No evidence of mitral valve  regurgitation. No evidence of mitral stenosis.   4. The aortic valve is tricuspid. Aortic valve regurgitation is not  visualized. No aortic stenosis is present.   5. Aortic dilatation  noted. There is mild dilatation at the level of the  sinuses of Valsalva, measuring 39 mm.   6. The inferior vena cava is normal in size with greater than 50%  respiratory variability, suggesting right atrial pressure of 3 mmHg.    LHC 11/19/2020: 1st Mrg lesion is 40% stenosed. Prox Cx to Mid Cx lesion is 80% stenosed. Non-stenotic RPAV lesion was previously treated. Ost RCA to Dist RCA lesion is 5% stenosed. Dist LM to Ost LAD lesion is 80% stenosed. Mid LAD lesion is 60% stenosed with 60% stenosed side branch in 1st Sept. Previously placed 2nd Diag stent (unknown type) is widely patent.   Multivessel CAD with stents in the LAD, diagonal, circumflex, circumflex marginal, and RCA extending to the PLA takeoff.   The LAD has progressive 80% ostial stenosis which seems to occur immediately proximal to the proximal stent.  There is diffuse 60% narrowing in the proximal and mid stented segment.  The diagonal stent is patent.  The distal portion of the mid LAD stent is patent.   Left circumflex stent extending from the ostium to the mid AV groove circumflex with focal eccentric 80% restenosis in the mid circumflex immediately after the takeoff of the marginal vessel.  There is 30 to 40% ostial narrowing in th marginal vessel which is stented ostially to proximally.   The RCA is diffusely stented from the ostium to the takeoff of the PLA vessel.  There is mild luminal irregularity.   Preserved global LV contractility with EF estimate at 18 mmHg.   RECOMMENDATION: The angiographic findings were reviewed with Dr. Gwenlyn Found in the catheterization laboratory.  With the ostial progression of 80% in the LAD as well as again restenosis in the circumflex vessel recommend surgical consultation for optimal long-term benefit.  We will hold prasugrel.  Increase anti-ischemic medications, optimal lipid management and BP control.     Cath 09/14/20 Previously placed RPAV stent (unknown type) is widely patent. Ost  RCA to Dist RCA lesion is 5% stenosed. Previously placed Prox LAD to Dist LAD stent (unknown type) is widely patent. Ost LAD lesion is 50% stenosed. Prox Cx to Mid Cx lesion is 99% stenosed. 1st Mrg lesion is 30% stenosed. Post intervention, there is a 30% residual stenosis. Post intervention, there is a 20% residual stenosis. The left ventricular systolic function is normal. LV end diastolic pressure is normal.   Multivessel diffuse stenting with multiple stents inserted from the ostium of the LAD to the distal third of the LAD with focal 50% ostial narrowing; stenting of the proximal to mid circumflex vessel with bifurcation stenting of the OM vessel with 99%  stenosis with thrombus in the circumflex stent immediately after the takeoff of the stented OM vessel with TIMI 0-I flow down the distal circumflex; and diffuse stenting of the entire dominant RCA extending from the ostium to the PDA without significant restenosis.   Preserved global LV function   Very difficult intervention with initial plaque shifting into the OM vessel requiring ultimate PTCA of both the OM ostium as well as the circumflex stent with Cutting Balloon intervention to the circumflex stent due to persistent intimal hyperplasia not resolved with noncompliant balloon dilatation.  Ultimate kissing balloon technique to the OM and circumflex with a residual narrowing in the OM at 20% and in the circumflex at  30% with resultant brisk TIMI-3 flow to the distal circumflex vessel supplying several additional vessels.   RECOMMENDATION: Long-term DAPT indefinitely due to the extensive stenting in this patient who previously had 29 stents placed in all of his major coronary arteries.  Aggressive lipid-lowering therapy with target LDL in the 50s or below with optimal blood pressure control.    Recent Labs: 11/17/2020: B Natriuretic Peptide 31.5 11/25/2020: ALT 88 11/27/2020: Magnesium 2.1 11/28/2020: Hemoglobin 13.5; Platelets  126 04/22/2021: BUN 16; Creatinine, Ser 1.04; Potassium 4.1; Sodium 134  04/26/2021: LDL Direct 57   CrCl cannot be calculated (Patient's most recent lab result is older than the maximum 21 days allowed.).   Wt Readings from Last 3 Encounters:  08/20/21 278 lb 3.2 oz (126.2 kg)  05/28/21 277 lb (125.6 kg)  05/07/21 275 lb (124.7 kg)     Other studies reviewed: Additional studies/records reviewed today include: summarized above  ASSESSMENT AND PLAN:  PPM Intact function No programming changes made  Paroxysmal Afib CHA2DS2Vasc is 3, on Eliquis, appropriately dosed <1% burden  CAD On Effient, statin, BB C/w Dr. Harrell Gave and team No anginal sounding symptoms  HTN No changes  5. Palpitations? He had a single PVC while I am watching during his device check, was unaware Wonder if this is what he feels on occasion? No way to tell burden by his device If this symptom continues, increases in frequency would have him wear a monitor For now, no changes   Disposition: F/u with Dr. Harrell Gave as scheduled, continue remotes from home as usual and in clinic with EP again in a year, sooner if needed   Current medicines are reviewed at length with the patient today.  The patient did not have any concerns regarding medicines.  Venetia Night, PA-C 08/20/2021 12:46 PM     [address] [place] [address] [phone] (office)  [phone] (fax)

## 2021-08-20 ENCOUNTER — Other Ambulatory Visit: Payer: Self-pay

## 2021-08-20 ENCOUNTER — Ambulatory Visit (INDEPENDENT_AMBULATORY_CARE_PROVIDER_SITE_OTHER): Payer: Medicare HMO | Admitting: Physician Assistant

## 2021-08-20 ENCOUNTER — Encounter: Payer: Self-pay | Admitting: Physician Assistant

## 2021-08-20 VITALS — BP 116/84 | HR 58 | Ht 73.0 in | Wt 278.2 lb

## 2021-08-20 DIAGNOSIS — I2510 Atherosclerotic heart disease of native coronary artery without angina pectoris: Secondary | ICD-10-CM

## 2021-08-20 DIAGNOSIS — I10 Essential (primary) hypertension: Secondary | ICD-10-CM | POA: Diagnosis not present

## 2021-08-20 DIAGNOSIS — Z950 Presence of cardiac pacemaker: Secondary | ICD-10-CM

## 2021-08-20 DIAGNOSIS — R002 Palpitations: Secondary | ICD-10-CM

## 2021-08-20 DIAGNOSIS — I480 Paroxysmal atrial fibrillation: Secondary | ICD-10-CM

## 2021-08-20 LAB — CUP PACEART INCLINIC DEVICE CHECK
Battery Remaining Longevity: 109 mo
Battery Voltage: 2.99 V
Brady Statistic RA Percent Paced: 0 %
Brady Statistic RV Percent Paced: 1.2 %
Date Time Interrogation Session: 20221004125338
Implantable Lead Implant Date: 20190322
Implantable Lead Implant Date: 20190322
Implantable Lead Location: 753859
Implantable Lead Location: 753860
Implantable Pulse Generator Implant Date: 20190322
Lead Channel Impedance Value: 375 Ohm
Lead Channel Impedance Value: 487.5 Ohm
Lead Channel Pacing Threshold Amplitude: 1 V
Lead Channel Pacing Threshold Amplitude: 1 V
Lead Channel Pacing Threshold Amplitude: 1 V
Lead Channel Pacing Threshold Amplitude: 1 V
Lead Channel Pacing Threshold Pulse Width: 0.5 ms
Lead Channel Pacing Threshold Pulse Width: 0.5 ms
Lead Channel Pacing Threshold Pulse Width: 1 ms
Lead Channel Pacing Threshold Pulse Width: 1 ms
Lead Channel Sensing Intrinsic Amplitude: 0.4 mV
Lead Channel Sensing Intrinsic Amplitude: 7.5 mV
Lead Channel Setting Pacing Amplitude: 2.5 V
Lead Channel Setting Pacing Amplitude: 2.5 V
Lead Channel Setting Pacing Pulse Width: 0.5 ms
Lead Channel Setting Sensing Sensitivity: 2.5 mV
Pulse Gen Model: 2272
Pulse Gen Serial Number: 8996724

## 2021-08-20 NOTE — Patient Instructions (Signed)
Medication Instructions:   Your physician recommends that you continue on your current medications as directed. Please refer to the Current Medication list given to you today.   *If you need a refill on your cardiac medications before your next appointment, please call your pharmacy*   Lab Work: [place]     If you have labs (blood work) drawn today and your tests are completely normal, you will receive your results only by: [facility] (if you have MyChart) OR A paper copy in the mail If you have any lab test that is abnormal or we need to change your treatment, we will call you to review the results.   Testing/Procedures: NONE ORDERED  TODAY     Follow-Up: At [facility], you and your health needs are our priority.  As part of our continuing mission to provide you with exceptional heart care, we have created designated Provider Care Teams.  These Care Teams include your primary Cardiologist (physician) and Advanced Practice Providers (APPs -  Physician Assistants and Nurse Practitioners) who all work together to provide you with the care you need, when you need it.  We recommend signing up for the patient portal called "MyChart".  Sign up information is provided on this After Visit Summary.  MyChart is used to connect with patients for Virtual Visits (Telemedicine).  Patients are able to view lab/test results, encounter notes, upcoming appointments, etc.  Non-urgent messages can be sent to your provider as well.   To learn more about what you can do with MyChart, go to [email or web address].    Your next appointment:   1 year  The format for your next appointment:   In Person  Provider:   You may see Dr. Caryl Comes  one of the following Advanced Practice Providers on your designated Care Team:   Tommye Standard, [place] Legrand Como "Jonni Sanger" Chalmers Cater, [place]   Other Instructions

## 2021-08-22 ENCOUNTER — Encounter: Payer: Medicare HMO | Admitting: Internal Medicine

## 2021-08-31 ENCOUNTER — Encounter (HOSPITAL_BASED_OUTPATIENT_CLINIC_OR_DEPARTMENT_OTHER): Payer: Self-pay

## 2021-09-02 ENCOUNTER — Ambulatory Visit (INDEPENDENT_AMBULATORY_CARE_PROVIDER_SITE_OTHER): Payer: Self-pay

## 2021-09-02 DIAGNOSIS — I480 Paroxysmal atrial fibrillation: Secondary | ICD-10-CM

## 2021-09-04 ENCOUNTER — Ambulatory Visit: Payer: Medicare HMO | Admitting: Dermatology

## 2021-09-04 ENCOUNTER — Encounter: Payer: Self-pay | Admitting: Dermatology

## 2021-09-04 ENCOUNTER — Other Ambulatory Visit: Payer: Self-pay

## 2021-09-04 DIAGNOSIS — Z1283 Encounter for screening for malignant neoplasm of skin: Secondary | ICD-10-CM

## 2021-09-04 DIAGNOSIS — L720 Epidermal cyst: Secondary | ICD-10-CM

## 2021-09-04 DIAGNOSIS — C4491 Basal cell carcinoma of skin, unspecified: Secondary | ICD-10-CM

## 2021-09-04 DIAGNOSIS — L918 Other hypertrophic disorders of the skin: Secondary | ICD-10-CM

## 2021-09-04 DIAGNOSIS — D485 Neoplasm of uncertain behavior of skin: Secondary | ICD-10-CM

## 2021-09-04 DIAGNOSIS — L910 Hypertrophic scar: Secondary | ICD-10-CM

## 2021-09-04 DIAGNOSIS — C44519 Basal cell carcinoma of skin of other part of trunk: Secondary | ICD-10-CM

## 2021-09-04 HISTORY — DX: Basal cell carcinoma of skin, unspecified: C44.91

## 2021-09-04 LAB — CUP PACEART REMOTE DEVICE CHECK
Battery Remaining Longevity: 94 mo
Battery Remaining Percentage: 74 %
Battery Voltage: 2.99 V
Brady Statistic AP VP Percent: 1 %
Brady Statistic AP VS Percent: 1 %
Brady Statistic AS VP Percent: 1 %
Brady Statistic AS VS Percent: 98 %
Brady Statistic RA Percent Paced: 1 %
Brady Statistic RV Percent Paced: 1 %
Date Time Interrogation Session: 20221015124408
Implantable Lead Implant Date: 20190322
Implantable Lead Implant Date: 20190322
Implantable Lead Location: 753859
Implantable Lead Location: 753860
Implantable Pulse Generator Implant Date: 20190322
Lead Channel Impedance Value: 400 Ohm
Lead Channel Impedance Value: 530 Ohm
Lead Channel Pacing Threshold Amplitude: 1 V
Lead Channel Pacing Threshold Amplitude: 1 V
Lead Channel Pacing Threshold Pulse Width: 0.5 ms
Lead Channel Pacing Threshold Pulse Width: 1 ms
Lead Channel Sensing Intrinsic Amplitude: 0.5 mV
Lead Channel Sensing Intrinsic Amplitude: 6.9 mV
Lead Channel Setting Pacing Amplitude: 2.5 V
Lead Channel Setting Pacing Amplitude: 2.5 V
Lead Channel Setting Pacing Pulse Width: 0.5 ms
Lead Channel Setting Sensing Sensitivity: 2.5 mV
Pulse Gen Model: 2272
Pulse Gen Serial Number: 8996724

## 2021-09-04 NOTE — Patient Instructions (Signed)

## 2021-09-09 ENCOUNTER — Telehealth: Payer: Self-pay | Admitting: *Deleted

## 2021-09-09 NOTE — Telephone Encounter (Signed)
-----   Message from Lavonna Monarch, MD sent at 09/07/2021  7:19 PM EDT ----- Schedule surgery with Dr. Darene Lamer

## 2021-09-09 NOTE — Telephone Encounter (Signed)
Left message for patient to return our phone call for results.

## 2021-09-11 NOTE — Progress Notes (Signed)
Remote pacemaker transmission.   

## 2021-09-11 NOTE — Telephone Encounter (Signed)
Patient is returning call from yesterday about pathology results.

## 2021-09-12 ENCOUNTER — Encounter: Payer: Self-pay | Admitting: *Deleted

## 2021-09-12 NOTE — Telephone Encounter (Signed)
Phone call to patient with his pathology results. Patient aware or results.

## 2021-09-20 ENCOUNTER — Encounter: Payer: Self-pay | Admitting: Dermatology

## 2021-09-20 NOTE — Progress Notes (Signed)
   New patient visit   Subjective  Ryan Clarke is a 65 y.o. 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  History of 2 biopsy-proven nonmelanoma skin cancers.  Check other areas on skin. Location:  Duration:  Quality:  Associated Signs/Symptoms: Modifying Factors:  Severity:  Timing: Context:   Objective  Well appearing patient in no apparent distress; mood and affect are within normal limits. Torso - Posterior (Back) Waist up skin examination.  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  He is able to identify the lesion on his arm.  There is also a typical basal cell carcinoma on his lower back which will be biopsied today.  Left Upper Back Noninflamed 1 cm deep dermal nodule  Left Lower Back Waxy 6 mm pink papule suggestive of BCC       Right Axilla Two millimeter flesh-colored pedunculated papule  Chest - Medial (Center) 2 cm linear area and thoracotomy scar with keloid/hypertrophic scar    All skin waist up examined.   Assessment & Plan    Encounter for screening for malignant neoplasm of skin Torso - Posterior (Back)  Schedule surgical appointment.  Annual skin examination.  Epidermal cyst Left Upper Back  Patient informed that this is benign with a small risk of inflammation, surgery not medically necessary.  Neoplasm of uncertain behavior of skin Left Lower Back  Skin / nail biopsy Type of biopsy: tangential   Informed consent: discussed and consent obtained   Timeout: patient name, date of birth, surgical site, and procedure verified   Procedure prep:  Patient was prepped and draped in usual sterile fashion (Non sterile) Prep type:  Chlorhexidine Anesthesia: the lesion was anesthetized in a standard fashion   Anesthetic:  1% lidocaine w/ epinephrine 1-100,000 local infiltration Instrument used: flexible razor blade   Outcome: patient  tolerated procedure well   Post-procedure details: wound care instructions given    Specimen 1 - Surgical pathology Differential Diagnosis: bcc vs scc  Check Margins: No  Skin tag Right Axilla  Removal not medically necessary.  Keloid Chest - Medial (Center)  Bypass in jan- if doesn't calm down will inject in future      I, Lavonna Monarch, MD, have reviewed all documentation for this visit.  The documentation on 09/20/21 for the exam, diagnosis, procedures, and orders are all accurate and complete.

## 2021-09-30 ENCOUNTER — Other Ambulatory Visit: Payer: Self-pay

## 2021-09-30 ENCOUNTER — Ambulatory Visit (INDEPENDENT_AMBULATORY_CARE_PROVIDER_SITE_OTHER): Payer: Medicare HMO | Admitting: Cardiology

## 2021-09-30 ENCOUNTER — Encounter (HOSPITAL_BASED_OUTPATIENT_CLINIC_OR_DEPARTMENT_OTHER): Payer: Self-pay | Admitting: Cardiology

## 2021-09-30 VITALS — BP 120/90 | HR 59 | Ht 73.0 in | Wt 283.0 lb

## 2021-09-30 DIAGNOSIS — Z951 Presence of aortocoronary bypass graft: Secondary | ICD-10-CM

## 2021-09-30 DIAGNOSIS — E7800 Pure hypercholesterolemia, unspecified: Secondary | ICD-10-CM

## 2021-09-30 DIAGNOSIS — Z79899 Other long term (current) drug therapy: Secondary | ICD-10-CM

## 2021-09-30 DIAGNOSIS — I2510 Atherosclerotic heart disease of native coronary artery without angina pectoris: Secondary | ICD-10-CM | POA: Diagnosis not present

## 2021-09-30 DIAGNOSIS — I10 Essential (primary) hypertension: Secondary | ICD-10-CM

## 2021-09-30 DIAGNOSIS — I480 Paroxysmal atrial fibrillation: Secondary | ICD-10-CM | POA: Diagnosis not present

## 2021-09-30 DIAGNOSIS — Z950 Presence of cardiac pacemaker: Secondary | ICD-10-CM

## 2021-09-30 NOTE — Progress Notes (Signed)
Cardiology Office Note:    Date:  09/30/2021   ID:  Ryan Clarke, DOB 1955/12/19, MRN [id number]  PCP:  [name], MD  Cardiologist:  Buford Dresser, MD  EP: Dr. Caryl Comes  CC: follow up  History of Present Illness:    Ryan Clarke is a 65 y.o. male with a hx of CAD with many stents remotely, now s/p CABGx2 11/2020, pacemaker who is seen for close follow up. I initially met him 08/14/20 as a referral for palpitations, but on discussion discovered extensive CAD history and angina  Cardiac history: Had all of his prior cardiac care as part of [facility] in [place]. Not available in Care Everywhere. Reports history of 29 stents, last stent 06/2019. Moved to [place] in 05/2020. S/P CABGx2 by Dr. Cyndia Bent on 3/55/73, complicated by post op atrial fibrillation. Due to recurrence of paroxysmal atrial fib, continued on anticoagulation.  Today: He is accompanied by his wife. Overall, he is feeling okay. Since his last visit, he has felt 2 episodes of possible afib, both self-limited.   He denies any recent episodes of his previous chest pain. However, after eating certain foods or eating too much, he develops discomfort in his stomach and chest which makes him nervious. He will take a Pepcid which seems to help. This discomfort worsens if he exercises after eating. If he exercises on an empty stomach he will not feel this pain.  At night, he will sometimes wake up with a dry, hacking cough.  He endorses easily bleeding and occasional bruising.  He denies any shortness of breath. No lightheadedness, headaches, syncope, orthopnea, PND, lower extremity edema or exertional symptoms.  Past Medical History:  Diagnosis Date   Carpal tunnel syndrome 09/10/2020   Coronary artery disease    Hypertension    Nodular basal cell carcinoma (BCC) 09/04/2021   Left Lower Back    Past Surgical History:  Procedure Laterality Date   CORONARY ARTERY BYPASS GRAFT N/A 11/26/2020   Procedure: CORONARY ARTERY  BYPASS GRAFTING (CABG) TIMES TWO USING BILATERAL INTERNAL MAMMARY ARTERIES;  Surgeon: Gaye Pollack, MD;  Location: [facility];  Service: Open Heart Surgery;  Laterality: N/A;  BIMA   CORONARY BALLOON ANGIOPLASTY N/A 09/14/2020   Procedure: CORONARY BALLOON ANGIOPLASTY;  Surgeon: Troy Sine, MD;  Location: [facility];  Service: Cardiovascular;  Laterality: N/A;   CORONARY STENT PLACEMENT     LEFT HEART CATH AND CORONARY ANGIOGRAPHY N/A 09/14/2020   Procedure: LEFT HEART CATH AND CORONARY ANGIOGRAPHY;  Surgeon: Troy Sine, MD;  [facility];  Service: Cardiovascular;  Laterality: N/A;   LEFT HEART CATH AND CORONARY ANGIOGRAPHY N/A 11/19/2020   Procedure: LEFT HEART CATH AND CORONARY ANGIOGRAPHY;  Surgeon: Troy Sine, MD;  Location: [facility];  Service: Cardiovascular;  Laterality: N/A;   PACEMAKER INSERTION     TEE WITHOUT CARDIOVERSION N/A 11/26/2020   Procedure: TRANSESOPHAGEAL ECHOCARDIOGRAM (TEE);  Surgeon: Gaye Pollack, MD;  Location: [facility];  Service: Open Heart Surgery;  Laterality: N/A;    Current Medications: Current Outpatient Medications on File Prior to Visit  Medication Sig   acetaminophen (TYLENOL) 325 MG tablet Take 2 tablets (650 mg total) by mouth every 6 (six) hours as needed for mild pain.   atorvastatin (LIPITOR) 80 MG tablet Take 1 tablet (80 mg total) by mouth at bedtime.   chlorthalidone (HYGROTON) 25 MG tablet Take 1 tablet (25 mg total) by mouth daily.   diclofenac Sodium (VOLTAREN) 1 %  GEL Apply 4 g topically 4 (four) times daily.   ELIQUIS 5 MG TABS tablet TAKE 1 TABLET BY MOUTH TWICE A DAY   famotidine (PEPCID) 20 MG tablet TAKE ONE-HALF TABLET BY MOUTH 2 TIMES DAILY AS NEEDED FOR HEARTBURN OR INDIGESTION.   metoprolol tartrate (LOPRESSOR) 25 MG tablet TAKE 1 TABLET BY MOUTH TWICE A DAY   prasugrel (EFFIENT) 10 MG TABS tablet Take 1 tablet (10 mg total) by mouth daily.   No current facility-administered medications on  file prior to visit.     Allergies:   Patient has no known allergies.   Social History   Tobacco Use   Smoking status: Never   Smokeless tobacco: Never  Substance Use Topics   Alcohol use: Never   Drug use: Never    Family History: No premature history of CAD  ROS:   Please see the history of present illness.   (+) Easy bleed/bruising (+) Chest/Stomach discomfort (+) Dry cough Additional pertinent ROS otherwise unremarkable   EKGs/Labs/Other Studies Reviewed:    The following studies were reviewed today:  TEE 11/26/2020: POST-OP IMPRESSIONS  - Left Ventricle: The left ventricle is unchanged from pre-bypass.  - Right Ventricle: The right ventricle appears unchanged from pre-bypass.  - Aorta: The aorta appears unchanged from pre-bypass.  - Left Atrium: The left atrium appears unchanged from pre-bypass.  - Left Atrial Appendage: The left atrial appendage appears unchanged from  pre-bypass.  - Aortic Valve: The aortic valve appears unchanged from pre-bypass.  - Mitral Valve: The mitral valve appears unchanged from pre-bypass.  - Tricuspid Valve: The tricuspid valve appears unchanged from pre-bypass.  - Interatrial Septum: The interatrial septum appears unchanged from  pre-bypass.  - Interventricular Septum: The interventricular septum appears unchanged  from  pre-bypass.  - Pericardium: The pericardium appears unchanged from pre-bypass.  - Comments: post bypass EF similar to pre surgery  50-55%  No RWMA.   Pre CABG Carotid Doppler 11/20/2020: Summary:  Right Carotid: Velocities in the right ICA are consistent with a 1-39%  stenosis.   Left Carotid: Velocities in the left ICA are consistent with a 1-39%  stenosis.   Right ABI: Resting right ankle-brachial index is within normal range. No  evidence of significant right lower extremity arterial disease.  Left ABI: Resting left ankle-brachial index is within normal range. No  evidence of significant left lower  extremity arterial disease.  Right Upper Extremity: Doppler waveforms remain within normal limits with  right radial compression. Doppler waveforms remain within normal limits  with right ulnar compression.  Left Upper Extremity: Doppler waveforms remain within normal limits with  left radial compression. Doppler waveforms remain within normal limits  with left ulnar compression.   Echo 11/19/2020: 1. Left ventricular ejection fraction, by estimation, is 55 to 60%. The  left ventricle has normal function. The left ventricle has no regional  wall motion abnormalities. There is mild concentric left ventricular  hypertrophy. Left ventricular diastolic  parameters are consistent with Grade I diastolic dysfunction (impaired  relaxation).   2. Right ventricular systolic function is normal. The right ventricular  size is normal.   3. The mitral valve is normal in structure. No evidence of mitral valve  regurgitation. No evidence of mitral stenosis.   4. The aortic valve is tricuspid. Aortic valve regurgitation is not  visualized. No aortic stenosis is present.   5. Aortic dilatation noted. There is mild dilatation at the level of the  sinuses of Valsalva, measuring 39 mm.  6. The inferior vena cava is normal in size with greater than 50%  respiratory variability, suggesting right atrial pressure of 3 mmHg.   LHC 11/19/2020: 1st Mrg lesion is 40% stenosed. Prox Cx to Mid Cx lesion is 80% stenosed. Non-stenotic RPAV lesion was previously treated. Ost RCA to Dist RCA lesion is 5% stenosed. Dist LM to Ost LAD lesion is 80% stenosed. Mid LAD lesion is 60% stenosed with 60% stenosed side branch in 1st Sept. Previously placed 2nd Diag stent (unknown type) is widely patent.   Multivessel CAD with stents in the LAD, diagonal, circumflex, circumflex marginal, and RCA extending to the PLA takeoff.   The LAD has progressive 80% ostial stenosis which seems to occur immediately proximal to the proximal  stent.  There is diffuse 60% narrowing in the proximal and mid stented segment.  The diagonal stent is patent.  The distal portion of the mid LAD stent is patent.   Left circumflex stent extending from the ostium to the mid AV groove circumflex with focal eccentric 80% restenosis in the mid circumflex immediately after the takeoff of the marginal vessel.  There is 30 to 40% ostial narrowing in th marginal vessel which is stented ostially to proximally.   The RCA is diffusely stented from the ostium to the takeoff of the PLA vessel.  There is mild luminal irregularity.   Preserved global LV contractility with EF estimate at 18 mmHg.   RECOMMENDATION: The angiographic findings were reviewed with Dr. Gwenlyn Found in the catheterization laboratory.  With the ostial progression of 80% in the LAD as well as again restenosis in the circumflex vessel recommend surgical consultation for optimal long-term benefit.  We will hold prasugrel.  Increase anti-ischemic medications, optimal lipid management and BP control.   Cath 09/14/20 Previously placed RPAV stent (unknown type) is widely patent. Ost RCA to Dist RCA lesion is 5% stenosed. Previously placed Prox LAD to Dist LAD stent (unknown type) is widely patent. Ost LAD lesion is 50% stenosed. Prox Cx to Mid Cx lesion is 99% stenosed. 1st Mrg lesion is 30% stenosed. Post intervention, there is a 30% residual stenosis. Post intervention, there is a 20% residual stenosis. The left ventricular systolic function is normal. LV end diastolic pressure is normal.   Multivessel diffuse stenting with multiple stents inserted from the ostium of the LAD to the distal third of the LAD with focal 50% ostial narrowing; stenting of the proximal to mid circumflex vessel with bifurcation stenting of the OM vessel with 99% stenosis with thrombus in the circumflex stent immediately after the takeoff of the stented OM vessel with TIMI 0-I flow down the distal circumflex; and  diffuse stenting of the entire dominant RCA extending from the ostium to the PDA without significant restenosis.   Preserved global LV function   Very difficult intervention with initial plaque shifting into the OM vessel requiring ultimate PTCA of both the OM ostium as well as the circumflex stent with Cutting Balloon intervention to the circumflex stent due to persistent intimal hyperplasia not resolved with noncompliant balloon dilatation.  Ultimate kissing balloon technique to the OM and circumflex with a residual narrowing in the OM at 20% and in the circumflex at  30% with resultant brisk TIMI-3 flow to the distal circumflex vessel supplying several additional vessels.   RECOMMENDATION: Long-term DAPT indefinitely due to the extensive stenting in this patient who previously had 29 stents placed in all of his major coronary arteries.  Aggressive lipid-lowering therapy with target LDL in  the 50s or below with optimal blood pressure control.   EKG:   EKG is personally reviewed. 09/30/2021: sinus bradycardia at 59 bpm, 1st degree AV block, RBBB 04/03/2021: sinus bradycardia at 51 bpm, 1st degree AV block, RBBB, prior inferior infarct 10/01/2020: sinus bradycardia/sinus arrhythma, RBBB, 1st degree AV block, prior inferior infarct  Recent Labs: 11/17/2020: B Natriuretic Peptide 31.5 11/25/2020: ALT 88 11/27/2020: Magnesium 2.1 11/28/2020: Hemoglobin 13.5; Platelets 126 04/22/2021: BUN 16; Creatinine, Ser 1.04; Potassium 4.1; Sodium 134   Recent Lipid Panel    Component Value Date/Time   CHOL 96 (L) 08/17/2020 1339   TRIG 174 (H) 08/17/2020 1339   HDL 32 (L) 08/17/2020 1339   CHOLHDL 3.0 08/17/2020 1339   LDLCALC 35 08/17/2020 1339   LDLDIRECT 57 04/26/2021 1100    Physical Exam:    VS:  BP 120/90   Pulse (!) 59   Ht _0  (1.854 m)   Wt 283 lb (128.4 kg)   SpO2 92%   BMI 37.34 kg/m     Wt Readings from Last 3 Encounters:  09/30/21 283 lb (128.4 kg)  08/20/21 278 lb 3.2 oz (126.2  kg)  05/28/21 277 lb (125.6 kg)    GEN: Well nourished, well developed in no acute distress HEENT: Normal, moist mucous membranes NECK: No JVD CARDIAC: regular rhythm, normal S1 and S2, no rubs or gallops. No murmur. Well healed sternal site without erythema, warmth, or drainage VASCULAR: Radial and DP pulses 2+ bilaterally. No carotid bruits RESPIRATORY:  Clear to auscultation without rales, wheezing or rhonchi  ABDOMEN: Soft, non-tender, non-distended MUSCULOSKELETAL:  Ambulates independently SKIN: Warm and dry, no edema NEUROLOGIC:  Alert and oriented x 3. No focal neuro deficits noted. PSYCHIATRIC:  Normal affect   ASSESSMENT:    1. Coronary artery disease involving native coronary artery of native heart without angina pectoris   2. Medication management   3. Paroxysmal atrial fibrillation (HCC)   4. S/P CABG x 2   5. Pure hypercholesterolemia   6. Primary hypertension   7. Cardiac pacemaker in situ     PLAN:    Coronary artery disease, with reported 29 prior stents in [place] (no records), now s/p 2V CABG 11/2020 -no recent angina -instructed on red flag warning signs that need immediate medical attention -continue prasugrel + apixaban vs. Change to clopidogrel in the future -continue atorvastatin -continue metoprolol. Consider imdur, amlodipine if additional anti-anginals needed  S/P Dual chamber [employer] pacemaker -established with device clinic  Paroxysmal atrial fibrillation: sinus bradycardia today -felt two brief, self-limited events, otherwise asymptomatic -CHA2DS2/VAS Stroke Risk Points=3 -now on prasugrel and apixaban. Will continue to follow the data re: antiplatelet and anticoagulation recommendations  Hypertension: systolic BP at goal, diastolic above goal -continue chlorthalidone -Had prior leg swelling on amlodipine. -continue metoprolol  Hyperlipidemia: -LDL goal <70, LDL 57 on recent lipids 04/26/21 per [facility] -continue atorvastatin  Cardiac risk  counseling and prevention recommendations: -recommend heart healthy/Mediterranean diet, with whole grains, fruits, vegetable, fish, lean meats, nuts, and olive oil. Limit salt. -recommend moderate walking, 3-5 times/week for 30-50 minutes each session. Aim for at least 150 minutes.week. Goal should be pace of 3 miles/hours, or walking 1.5 miles in 30 minutes -recommend avoidance of tobacco products. Avoid excess alcohol.  Plan for follow up: 6 mos or sooner as needed. Check labs prior to next visit  Buford Dresser, MD, PhD [place]  [facility]    Medication Adjustments/Labs and Tests Ordered: Current medicines are reviewed at length  with the patient today.  Concerns regarding medicines are outlined above.  Orders Placed This Encounter  Procedures   Basic metabolic panel   Lipid panel   EKG 12-Lead    No orders of the defined types were placed in this encounter.   Patient Instructions  Medication Instructions:  Your Physician recommend you continue on your current medication as directed.    *If you need a refill on your cardiac medications before your next appointment, please call your pharmacy*   Lab Work: Your provider has recommended lab work in 6 months ( Fasting lipid/BMP). Please have this collected at [facility] at [place]. The lab is open 8:00 am - 4:30 pm. Please avoid 12:00p - 1:00p for lunch hour. You do not need an appointment. Please go to [address] [place], [place] [address]. This is in the Primary Care office on the 3rd floor, let them know you are there for blood work and they will direct you to the lab.   If you have labs (blood work) drawn today and your tests are completely normal, you will receive your results only by: [place] (if you have MyChart) OR A paper copy in the mail If you have any lab test that is abnormal or we need to change your treatment, we will call you to review the  results.   Testing/Procedures: None ordered today   Follow-Up: At [facility], you and your health needs are our priority.  As part of our continuing mission to provide you with exceptional heart care, we have created designated Provider Care Teams.  These Care Teams include your primary Cardiologist (physician) and Advanced Practice Providers (APPs -  Physician Assistants and Nurse Practitioners) who all work together to provide you with the care you need, when you need it.  We recommend signing up for the patient portal called "MyChart".  Sign up information is provided on this After Visit Summary.  MyChart is used to connect with patients for Virtual Visits (Telemedicine).  Patients are able to view lab/test results, encounter notes, upcoming appointments, etc.  Non-urgent messages can be sent to your provider as well.   To learn more about what you can do with MyChart, go to [email or web address].    Your next appointment:   6 month(s)  The format for your next appointment:   In Person  Provider:   Buford Dresser, MD      [facility] [place],acting as a scribe for Buford Dresser, MD.,have documented all relevant documentation on the behalf of Buford Dresser, MD,as directed by  Buford Dresser, MD while in the presence of Buford Dresser, MD.  I, Buford Dresser, MD, have reviewed all documentation for this visit. The documentation on 09/30/21 for the exam, diagnosis, procedures, and orders are all accurate and complete.  Signed, Buford Dresser, MD PhD 09/30/2021   [place]

## 2021-09-30 NOTE — Patient Instructions (Signed)
Medication Instructions:  Your Physician recommend you continue on your current medication as directed.    *If you need a refill on your cardiac medications before your next appointment, please call your pharmacy*   Lab Work: Your provider has recommended lab work in 6 months ( Fasting lipid/BMP). Please have this collected at [facility] [place]. The lab is open 8:00 am - 4:30 pm. Please avoid 12:00p - 1:00p for lunch hour. You do not need an appointment. Please go to [address] [place], [place] [address]. This is in the Primary Care office on the 3rd floor, let them know you are there for blood work and they will direct you to the lab.   If you have labs (blood work) drawn today and your tests are completely normal, you will receive your results only by: [place] (if you have MyChart) OR A paper copy in the mail If you have any lab test that is abnormal or we need to change your treatment, we will call you to review the results.   Testing/Procedures: None ordered today   Follow-Up: At [facility], you and your health needs are our priority.  As part of our continuing mission to provide you with exceptional heart care, we have created designated Provider Care Teams.  These Care Teams include your primary Cardiologist (physician) and Advanced Practice Providers (APPs -  Physician Assistants and Nurse Practitioners) who all work together to provide you with the care you need, when you need it.  We recommend signing up for the patient portal called "MyChart".  Sign up information is provided on this After Visit Summary.  MyChart is used to connect with patients for Virtual Visits (Telemedicine).  Patients are able to view lab/test results, encounter notes, upcoming appointments, etc.  Non-urgent messages can be sent to your provider as well.   To learn more about what you can do with MyChart, go to [email or web address].    Your next appointment:    6 month(s)  The format for your next appointment:   In Person  Provider:   Buford Dresser, MD

## 2021-10-01 ENCOUNTER — Other Ambulatory Visit (HOSPITAL_COMMUNITY): Payer: Self-pay | Admitting: Physician Assistant

## 2021-10-01 NOTE — Telephone Encounter (Signed)
Pt last saw Dr Harrell Gave 09/30/21, last labs 04/22/21 Creat 1.04, age 65, weight 128.4kg, based on specified criteria pt is on appropriate dosage of Eliquis 5mg  BID for afib.  Will refill rx.

## 2021-10-03 ENCOUNTER — Encounter (HOSPITAL_BASED_OUTPATIENT_CLINIC_OR_DEPARTMENT_OTHER): Payer: Self-pay

## 2021-10-07 ENCOUNTER — Encounter: Payer: Self-pay | Admitting: *Deleted

## 2021-10-24 ENCOUNTER — Ambulatory Visit (INDEPENDENT_AMBULATORY_CARE_PROVIDER_SITE_OTHER): Payer: Medicare HMO | Admitting: Dermatology

## 2021-10-24 ENCOUNTER — Other Ambulatory Visit: Payer: Self-pay

## 2021-10-24 DIAGNOSIS — C44619 Basal cell carcinoma of skin of left upper limb, including shoulder: Secondary | ICD-10-CM | POA: Diagnosis not present

## 2021-10-24 DIAGNOSIS — D045 Carcinoma in situ of skin of trunk: Secondary | ICD-10-CM | POA: Diagnosis not present

## 2021-10-24 DIAGNOSIS — D049 Carcinoma in situ of skin, unspecified: Secondary | ICD-10-CM

## 2021-10-24 MED ORDER — TRIAMCINOLONE ACETONIDE 10 MG/ML IJ SUSP
10.0000 mg | Freq: Once | INTRAMUSCULAR | Status: AC
  Administered 2021-10-24: 10 mg

## 2021-10-24 NOTE — Patient Instructions (Signed)

## 2021-10-24 NOTE — Progress Notes (Signed)
Intralesional injection given in patients right upper back.  0.1 cc of 10 mg of Triamcinolone injected   [phone] [id number] Exp-feb/2024

## 2021-11-08 ENCOUNTER — Encounter: Payer: Self-pay | Admitting: Family Medicine

## 2021-11-12 ENCOUNTER — Encounter: Payer: Self-pay | Admitting: Dermatology

## 2021-11-12 NOTE — Progress Notes (Signed)
° °  Follow-Up Visit   Subjective  Ryan Clarke is a 65 y.o. male who presents for the following: Procedure (Treatment left lower back BCC nodular.).  Bent left lower back, CIS (biopsied by primary care physician) right upper back. Location:  Duration:  Quality:  Associated Signs/Symptoms: Modifying Factors:  Severity:  Timing: Context:   Objective  Well appearing patient in no apparent distress; mood and affect are within normal limits. Right Upper Back Lesion was biopsied at his primary care office.  Small keloid at site of biopsy will be injected with triamcinolone.  Left Lower Back Lesion identified by nurse and Dr.Kimie Pidcock in room.     All skin waist up examined.   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  Destruction of lesion Complexity: simple   Destruction method: electrodesiccation and curettage   Informed consent: discussed and consent obtained   Timeout:  patient name, date of birth, surgical site, and procedure verified Anesthesia: the lesion was anesthetized in a standard fashion   Anesthetic:  1% lidocaine w/ epinephrine 1-100,000 local infiltration Curettage performed in three different directions: Yes   Curettage cycles:  3 Lesion length (cm):  1.9 Lesion width (cm):  1.9 Margin per side (cm):  0 Final wound size (cm):  1.9 Hemostasis achieved with:  ferric subsulfate Outcome: patient tolerated procedure well with no complications   Additional details:  Wound innoculated with 5 fluorouracil solution.  Intralesional injection Location: right upper back   Informed Consent: Discussed risks (infection, pain, bleeding, bruising, thinning of the skin, loss of skin pigment,  Indentation, lack of resolution, and recurrence of lesion) and benefits of the procedure, as well as the alternatives. Informed consent was obtained. Preparation: The area was prepared in a standard fashion.   Procedure Details: An  intralesional injection was performed with Kenalog 10 mg/cc. 0.1 cc in total were injected.  Total number of injections: 1  Plan: The patient was instructed on post-op care. Recommend OTC analgesia as needed for pain.   triamcinolone acetonide (KENALOG) 10 MG/ML injection 10 mg   Basal cell carcinoma (BCC) of skin of left upper extremity including shoulder Left Lower Back  Destruction of lesion Complexity: simple   Destruction method: electrodesiccation and curettage   Informed consent: discussed and consent obtained   Timeout:  patient name, date of birth, surgical site, and procedure verified Anesthesia: the lesion was anesthetized in a standard fashion   Anesthetic:  1% lidocaine w/ epinephrine 1-100,000 local infiltration Curettage performed in three different directions: Yes   Curettage cycles:  3 Lesion length (cm):  1.5 Lesion width (cm):  1.5 Margin per side (cm):  0 Final wound size (cm):  1.5 Hemostasis achieved with:  ferric subsulfate Outcome: patient tolerated procedure well with no complications   Post-procedure details: wound care instructions given   Additional details:  Wound innoculated with 5 fluorouracil solution.      I, Lavonna Monarch, MD, have reviewed all documentation for this visit.  The documentation on 11/12/21 for the exam, diagnosis, procedures, and orders are all accurate and complete.

## 2021-11-17 ENCOUNTER — Encounter (HOSPITAL_BASED_OUTPATIENT_CLINIC_OR_DEPARTMENT_OTHER): Payer: Self-pay

## 2021-11-19 ENCOUNTER — Telehealth: Payer: Self-pay

## 2021-11-19 NOTE — Telephone Encounter (Signed)
Attempted to contact patient to discuss manual transmission sent 11/17/21 for patient complaint of symptomatic palpitations. Given number invalid. Will reach out to patient via MyChart for updated number and to discuss tachy rhythm and compliance with metoprolol.  Patient is scheduled to see Ryan Clarke 11/27/21 for complaints.

## 2021-11-19 NOTE — Telephone Encounter (Signed)
Please see previous encounter

## 2021-11-21 NOTE — Telephone Encounter (Signed)
Unsuccessful telephone encounter to patient to follow up on manual transmission sent 11/17/21 for symptomatic palpitations. Patient appears to be in a 1:1 brief tachycardia on presentation. 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 Patient has appointment 11/27/21 with Sheela Stack to discuss. Per DPR, detailed message left on VM indicating the above. Patient is provided device clinic contact if he has any additional questions regarding his transmission.

## 2021-11-22 NOTE — Telephone Encounter (Signed)
Noted  

## 2021-11-26 NOTE — Progress Notes (Signed)
Cardiology Office Note:    Date:  11/27/2021   ID:  Ryan Clarke, DOB January 14, 1956, MRN [id number]  PCP:  [name], MD   [facility] Providers Cardiologist:  Buford Dresser, MD     Referring MD: [name], MD   Evaluation of palpitations  History of Present Illness:    Ryan Clarke is a 66 y.o. male with a hx of hypertension, hyperlipidemia, polycythemia, PPM paroxysmal atrial fibrillation and coronary artery disease status post several stents.  He underwent cardiac catheterization 09/14/2020 which showed multivessel diffuse stenting, LAD 50% ostial narrowing, OM vessel 99% stenosis with thrombus in the circumflex stent.  Angioplasty was performed.  He was admitted 11/16/2020 with unstable angina and underwent CABG x2 by Dr. Cyndia Bent.  He was noted to have postoperative atrial fibrillation and was started on amiodarone which converted him to sinus rhythm.  He reported he felt much better in sinus rhythm.  CHA2DS2-VASc score 3.   He was seen in the atrial fibrillation clinic by Malka So, PA-C on 12/26/2020.  During that time he denied palpitations, chest pain, orthopnea, PND, increased work of breathing, lower extremity swelling, dizziness, syncope, increased fatigue or neurological sequela.  He was tolerating his medications well and had no complaints that time.   He presented to the clinic 01/03/2021 for follow-up evaluation stated he felt well.  He denied any further episodes of atrial fibrillation or irregular heartbeats.  His EKG today showed sinus bradycardia right bundle branch block 57 bpm.  He had been walking for an hour to an hour and a half daily and did not notice any chest pain.  His main complaint was his right neck.  He reported that he had intermittent episodes of pain.  He reported that his incisions had healed well.  I discussed his amiodarone use with the DOD.  We had him finish his 200 mg prescription.  Planned for him to take 100 mg of amiodarone x1 month,  follow-up with the A. fib clinic, continue his aspirin and Effient, and have him wear a 14-day ZIO monitor. I gave him salty 6 diet sheet, asked him to increase his p.o. hydration, and follow-up with Dr. Harrell Gave in 3 months.  He was seen in follow-up by Dr. Harrell Gave on 09/30/2021.  During that time he reported that he felt well.  He has noted 2 episodes of possible atrial fibrillation which were self-limited.  He denied episodes of chest discomfort.  He did note that after eating large amounts of food he would occasionally notice some discomfort in the stomach and chest which caused nervousness.  He would take Pepcid which helped with his symptoms.  The discomfort would worsen if he chose to exercise after eating.  With exercising on an empty stomach he denied discomfort.  He denied shortness of breath, lightheadedness, headaches, syncope, orthopnea, PND and lower extremity swelling.  He provided a remote transmission on 11/19/2021 which showed a tacky rhythm.  Plan to contact the patient and discuss metoprolol dosing was reviewed with Dr. Caryl Comes.  He presents to the clinic today for follow-up evaluation states he feels well today.  His episodes of increased heart rate/palpitations have resolved.  He did not recognize any pattern around these events.  He reports that they would happen at random times through the day last for minutes and then dissipate on their own.  We reviewed his remote transmission and triggers for palpitations.  He expressed understanding.  He may take an extra half metoprolol for extended episodes of  palpitations as well as if systolic blood pressures greater than 110.  I will order a BMP, CBC, give triggers for palpitations, and plan follow-up for 3 to 4 months.   Today he denies chest pain, shortness of breath, lower extremity edema, fatigue, palpitations, melena, hematuria, hemoptysis, diaphoresis, weakness, presyncope, syncope, orthopnea, and PND.  Past Medical History:   Diagnosis Date   Carpal tunnel syndrome 09/10/2020   Coronary artery disease    Hypertension    Nodular basal cell carcinoma (BCC) 09/04/2021   Left Lower Back    Past Surgical History:  Procedure Laterality Date   CORONARY ARTERY BYPASS GRAFT N/A 11/26/2020   Procedure: CORONARY ARTERY BYPASS GRAFTING (CABG) TIMES TWO USING BILATERAL INTERNAL MAMMARY ARTERIES;  Surgeon: Gaye Pollack, MD;  Location: [facility];  Service: Open Heart Surgery;  Laterality: N/A;  BIMA   CORONARY BALLOON ANGIOPLASTY N/A 09/14/2020   Procedure: CORONARY BALLOON ANGIOPLASTY;  Surgeon: Troy Sine, MD;  Location: [place] [facility];  Service: Cardiovascular;  Laterality: N/A;   CORONARY STENT PLACEMENT     LEFT HEART CATH AND CORONARY ANGIOGRAPHY N/A 09/14/2020   Procedure: LEFT HEART CATH AND CORONARY ANGIOGRAPHY;  Surgeon: Troy Sine, MD;  Location: [place];  Service: Cardiovascular;  Laterality: N/A;   LEFT HEART CATH AND CORONARY ANGIOGRAPHY N/A 11/19/2020   Procedure: LEFT HEART CATH AND CORONARY ANGIOGRAPHY;  Surgeon: Troy Sine, MD;  Location: [place] [facility];  Service: Cardiovascular;  Laterality: N/A;   PACEMAKER INSERTION     TEE WITHOUT CARDIOVERSION N/A 11/26/2020   Procedure: TRANSESOPHAGEAL ECHOCARDIOGRAM (TEE);  Surgeon: Gaye Pollack, MD;  Location: [facility];  Service: Open Heart Surgery;  Laterality: N/A;    Current Medications: Current Meds  Medication Sig   acetaminophen (TYLENOL) 325 MG tablet Take 2 tablets (650 mg total) by mouth every 6 (six) hours as needed for mild pain.   apixaban (ELIQUIS) 5 MG TABS tablet TAKE 1 TABLET BY MOUTH TWICE A DAY   atorvastatin (LIPITOR) 80 MG tablet Take 1 tablet (80 mg total) by mouth at bedtime.   chlorthalidone (HYGROTON) 25 MG tablet Take 1 tablet (25 mg total) by mouth daily.   diclofenac Sodium (VOLTAREN) 1 % GEL Apply 4 g topically 4 (four) times daily.   famotidine (PEPCID) 20 MG tablet TAKE ONE-HALF TABLET BY MOUTH 2 TIMES  DAILY AS NEEDED FOR HEARTBURN OR INDIGESTION.   metoprolol tartrate (LOPRESSOR) 25 MG tablet TAKE 1 TABLET BY MOUTH TWICE A DAY   prasugrel (EFFIENT) 10 MG TABS tablet Take 1 tablet (10 mg total) by mouth daily.     Allergies:   Patient has no known allergies.   Social History   Socioeconomic History   Marital status: Legally Separated    Spouse name: Not on file   Number of children: Not on file   Years of education: Not on file   Highest education level: Not on file  Occupational History   Not on file  Tobacco Use   Smoking status: Never   Smokeless tobacco: Never  Substance and Sexual Activity   Alcohol use: Never   Drug use: Never   Sexual activity: Not on file  Other Topics Concern   Not on file  Social History Narrative   Not on file   Social Determinants of Health   Financial Resource Strain: Not on file  Food Insecurity: Not on file  Transportation Needs: Not on file  Physical Activity: Not on file  Stress: Not on file  Social Connections: Not on file     Family History: The patient's family history includes Coronary artery disease in his brother, brother, father, and mother; Liver cancer in his maternal grandmother.  ROS:   Please see the history of present illness.     All other systems reviewed and are negative.   Risk Assessment/Calculations:           Physical Exam:    VS:  BP 108/72    Pulse 60    Ht 6\' 1"  (1.854 m)    Wt 282 lb 3.2 oz (128 kg)    SpO2 98%    BMI 37.23 kg/m     Wt Readings from Last 3 Encounters:  11/27/21 282 lb 3.2 oz (128 kg)  09/30/21 283 lb (128.4 kg)  08/20/21 278 lb 3.2 oz (126.2 kg)     GEN:  Well nourished, well developed in no acute distress HEENT: Normal NECK: No JVD; No carotid bruits LYMPHATICS: No lymphadenopathy CARDIAC: RRR, no murmurs, rubs, gallops RESPIRATORY:  Clear to auscultation without rales, wheezing or rhonchi  ABDOMEN: Soft, non-tender, non-distended MUSCULOSKELETAL:  No edema; No deformity   SKIN: Warm and dry NEUROLOGIC:  Alert and oriented x 3 PSYCHIATRIC:  Normal affect    EKGs/Labs/Other Studies Reviewed:      EKG:  EKG is  ordered today.  The ekg ordered today demonstrates sinus rhythm with first-degree AV block nonspecific intraventricular block inferior infarct undetermined age 6 bpm   EKG 01/03/2021  sinus bradycardia right bundle branch block no ST or T wave deviation 57 bpm- No acute changes   Cardiac catheterization 12/16/2020 1st Mrg lesion is 40% stenosed. Prox Cx to Mid Cx lesion is 80% stenosed. Non-stenotic RPAV lesion was previously treated. Ost RCA to Dist RCA lesion is 5% stenosed. Dist LM to Ost LAD lesion is 80% stenosed. Mid LAD lesion is 60% stenosed with 60% stenosed side branch in 1st Sept. Previously placed 2nd Diag stent (unknown type) is widely patent.   Multivessel CAD with stents in the LAD, diagonal, circumflex, circumflex marginal, and RCA extending to the PLA takeoff.   The LAD has progressive 80% ostial stenosis which seems to occur immediately proximal to the proximal stent.  There is diffuse 60% narrowing in the proximal and mid stented segment.  The diagonal stent is patent.  The distal portion of the mid LAD stent is patent.   Left circumflex stent extending from the ostium to the mid AV groove circumflex with focal eccentric 80% restenosis in the mid circumflex immediately after the takeoff of the marginal vessel.  There is 30 to 40% ostial narrowing in th marginal vessel which is stented ostially to proximally.   The RCA is diffusely stented from the ostium to the takeoff of the PLA vessel.  There is mild luminal irregularity.   Preserved global LV contractility with EF estimate at 18 mmHg.   RECOMMENDATION: The angiographic findings were reviewed with Dr. Gwenlyn Found in the catheterization laboratory.  With the ostial progression of 80% in the LAD as well as again restenosis in the circumflex vessel recommend surgical consultation  for optimal long-term benefit.  We will hold prasugrel.  Increase anti-ischemic medications, optimal lipid management and BP control.   Diagnostic Dominance: Right     Intervention     Echocardiogram 11/19/2020 IMPRESSIONS     1. Left ventricular ejection fraction, by estimation, is 55 to 60%. The  left ventricle has normal function. The left ventricle has  no regional  wall motion abnormalities. There is mild concentric left ventricular  hypertrophy. Left ventricular diastolic  parameters are consistent with Grade I diastolic dysfunction (impaired  relaxation).   2. Right ventricular systolic function is normal. The right ventricular  size is normal.   3. The mitral valve is normal in structure. No evidence of mitral valve  regurgitation. No evidence of mitral stenosis.   4. The aortic valve is tricuspid. Aortic valve regurgitation is not  visualized. No aortic stenosis is present.   5. Aortic dilatation noted. There is mild dilatation at the level of the  sinuses of Valsalva, measuring 39 mm.   6. The inferior vena cava is normal in size with greater than 50%  respiratory variability, suggesting right atrial pressure of 3 mmHg.   Recent Labs: 11/27/2020: Magnesium 2.1 11/28/2020: Hemoglobin 13.5; Platelets 126 04/22/2021: BUN 16; Creatinine, Ser 1.04; Potassium 4.1; Sodium 134  Recent Lipid Panel    Component Value Date/Time   CHOL 96 (L) 08/17/2020 1339   TRIG 174 (H) 08/17/2020 1339   HDL 32 (L) 08/17/2020 1339   CHOLHDL 3.0 08/17/2020 1339   LDLCALC 35 08/17/2020 1339   LDLDIRECT 57 04/26/2021 1100    ASSESSMENT & PLAN    Paroxysmal atrial fibrillation-EKG today shows sinus rhythm with first-degree AV block nonspecific interventricular block inferior infarct undetermined age 71 bpm.  Remote interrogation 11/19/2021 showed tachycardic rhythm.   Continue apixaban, metoprolol, Effient May take an extra half  metoprolol for sustained accelerated heart rate-with blood  pressure greater than 694 systolic. Avoid triggers caffeine, chocolate, EtOH, dehydration etc. Order CBC, BMP   Permanent pacemaker-interrogated 08/31/2021.  Device functioning normally, battery status adequate/good.  Lead measurements unchanged and histograms appropriate.    Battery life 71 months Follows with Dr. Caryl Comes  Coronary artery disease-no chest pain today.  Underwent CABG x2 by Dr. Cyndia Bent on 11/26/2020.  Was noted to have postoperative atrial fibrillation and was placed on amiodarone.  He has been seen by A. fib clinic. Continue atorvastatin, metoprolol, Effient Heart healthy low-sodium diet-salty 6 given Increase physical activity as tolerated  Essential hypertension-BP today 108/72.  Well-controlled at home. Continue metoprolol Heart healthy low-sodium diet-salty 6 given Increase physical activity as tolerated increase p.o. hydration      Disposition: Follow-up with Dr. Harrell Gave in 3-4 months.        Medication Adjustments/Labs and Tests Ordered: Current medicines are reviewed at length with the patient today.  Concerns regarding medicines are outlined above.  Orders Placed This Encounter  Procedures   Basic metabolic panel   CBC   EKG 12-Lead   No orders of the defined types were placed in this encounter.   Patient Instructions  Medication Instructions:  Your Physician recommend you continue on your current medication as directed.     You may take an Extra Half tablet of Metoprolol for breakthrough Atrial Fibrillation or extended periods of Palpitations   *If you need a refill on your cardiac medications before your next appointment, please call your pharmacy*   Lab Work: Your physician recommends that you return for lab work Today- BMP, CBC  If you have labs (blood work) drawn today and your tests are completely normal, you will receive your results only by: [place] (if you have MyChart) OR A paper copy in the mail If you have any lab test  that is abnormal or we need to change your treatment, we will call you to review the results.   Testing/Procedures: None ordered today  Follow-Up: At [facility], you and your health needs are our priority.  As part of our continuing mission to provide you with exceptional heart care, we have created designated Provider Care Teams.  These Care Teams include your primary Cardiologist (physician) and Advanced Practice Providers (APPs -  Physician Assistants and Nurse Practitioners) who all work together to provide you with the care you need, when you need it.  We recommend signing up for the patient portal called "MyChart".  Sign up information is provided on this After Visit Summary.  MyChart is used to connect with patients for Virtual Visits (Telemedicine).  Patients are able to view lab/test results, encounter notes, upcoming appointments, etc.  Non-urgent messages can be sent to your provider as well.   To learn more about what you can do with MyChart, go to [email or web address].    Your next appointment:   3-4 month(s)  The format for your next appointment:   In Person  Provider:   Buford Dresser, MD    Other Instructions Please work on increasing your hydration   Signed, Deberah Pelton, NP  11/27/2021 9:52 AM      Notice: This dictation was prepared with Dragon dictation along with smaller phrase technology. Any transcriptional errors that result from this process are unintentional and may not be corrected upon review.  I spent 15 minutes examining this patient, reviewing medications, and using patient centered shared decision making involving her cardiac care.  Prior to her visit I spent greater than 20 minutes reviewing her past medical history,  medications, and prior cardiac tests.

## 2021-11-27 ENCOUNTER — Other Ambulatory Visit: Payer: Self-pay

## 2021-11-27 ENCOUNTER — Encounter (HOSPITAL_BASED_OUTPATIENT_CLINIC_OR_DEPARTMENT_OTHER): Payer: Self-pay | Admitting: General Practice

## 2021-11-27 ENCOUNTER — Ambulatory Visit (INDEPENDENT_AMBULATORY_CARE_PROVIDER_SITE_OTHER): Payer: Medicare HMO | Admitting: General Practice

## 2021-11-27 VITALS — BP 108/72 | HR 60 | Ht 73.0 in | Wt 282.2 lb

## 2021-11-27 DIAGNOSIS — Z950 Presence of cardiac pacemaker: Secondary | ICD-10-CM | POA: Diagnosis not present

## 2021-11-27 DIAGNOSIS — I2510 Atherosclerotic heart disease of native coronary artery without angina pectoris: Secondary | ICD-10-CM | POA: Diagnosis not present

## 2021-11-27 DIAGNOSIS — I480 Paroxysmal atrial fibrillation: Secondary | ICD-10-CM

## 2021-11-27 DIAGNOSIS — I10 Essential (primary) hypertension: Secondary | ICD-10-CM

## 2021-11-27 LAB — BASIC METABOLIC PANEL
BUN/Creatinine Ratio: 13 (ref 10–24)
BUN: 14 mg/dL (ref 8–27)
CO2: 27 mmol/L (ref 20–29)
Calcium: 10 mg/dL (ref 8.6–10.2)
Chloride: 98 mmol/L (ref 96–106)
Creatinine, Ser: 1.07 mg/dL (ref 0.76–1.27)
Glucose: 110 mg/dL — ABNORMAL HIGH (ref 70–99)
Potassium: 3.5 mmol/L (ref 3.5–5.2)
Sodium: 142 mmol/L (ref 134–144)
eGFR: 77 mL/min/{1.73_m2} (ref >59)

## 2021-11-27 LAB — CBC
Hematocrit: 53.1 % — ABNORMAL HIGH (ref 37.5–51.0)
Hemoglobin: 18.1 g/dL — ABNORMAL HIGH (ref 13.0–17.7)
MCH: 30.7 pg (ref 26.6–33.0)
MCHC: 34.1 g/dL (ref 31.5–35.7)
MCV: 90 fL (ref 79–97)
Platelets: 232 10*3/uL (ref 150–450)
RBC: 5.9 x10E6/uL — ABNORMAL HIGH (ref 4.14–5.80)
RDW: 12.4 % (ref 11.6–15.4)
WBC: 7.3 10*3/uL (ref 3.4–10.8)

## 2021-11-27 NOTE — Patient Instructions (Signed)
Medication Instructions:  Your Physician recommend you continue on your current medication as directed.     You may take an Extra Half tablet of Metoprolol for breakthrough Atrial Fibrillation or extended periods of Palpitations   *If you need a refill on your cardiac medications before your next appointment, please call your pharmacy*   Lab Work: Your physician recommends that you return for lab work Today- BMP, CBC  If you have labs (blood work) drawn today and your tests are completely normal, you will receive your results only by: [place] (if you have MyChart) OR A paper copy in the mail If you have any lab test that is abnormal or we need to change your treatment, we will call you to review the results.   Testing/Procedures: None ordered today    Follow-Up: At [facility], you and your health needs are our priority.  As part of our continuing mission to provide you with exceptional heart care, we have created designated Provider Care Teams.  These Care Teams include your primary Cardiologist (physician) and Advanced Practice Providers (APPs -  Physician Assistants and Nurse Practitioners) who all work together to provide you with the care you need, when you need it.  We recommend signing up for the patient portal called "MyChart".  Sign up information is provided on this After Visit Summary.  MyChart is used to connect with patients for Virtual Visits (Telemedicine).  Patients are able to view lab/test results, encounter notes, upcoming appointments, etc.  Non-urgent messages can be sent to your provider as well.   To learn more about what you can do with MyChart, go to [email or web address].    Your next appointment:   3-4 month(s)  The format for your next appointment:   In Person  Provider:   Buford Dresser, MD    Other Instructions Please work on increasing your hydration

## 2021-12-02 ENCOUNTER — Ambulatory Visit (INDEPENDENT_AMBULATORY_CARE_PROVIDER_SITE_OTHER): Payer: Self-pay

## 2021-12-02 DIAGNOSIS — I480 Paroxysmal atrial fibrillation: Secondary | ICD-10-CM

## 2021-12-02 LAB — CUP PACEART REMOTE DEVICE CHECK
Battery Remaining Longevity: 91 mo
Battery Remaining Percentage: 72 %
Battery Voltage: 2.98 V
Brady Statistic AP VP Percent: 1 %
Brady Statistic AP VS Percent: 1 %
Brady Statistic AS VP Percent: 1.4 %
Brady Statistic AS VS Percent: 97 %
Brady Statistic RA Percent Paced: 1 %
Brady Statistic RV Percent Paced: 1.4 %
Date Time Interrogation Session: 20230115081612
Implantable Lead Implant Date: 20190322
Implantable Lead Implant Date: 20190322
Implantable Lead Location: 753859
Implantable Lead Location: 753860
Implantable Pulse Generator Implant Date: 20190322
Lead Channel Impedance Value: 390 Ohm
Lead Channel Impedance Value: 530 Ohm
Lead Channel Pacing Threshold Amplitude: 1 V
Lead Channel Pacing Threshold Amplitude: 1 V
Lead Channel Pacing Threshold Pulse Width: 0.5 ms
Lead Channel Pacing Threshold Pulse Width: 1 ms
Lead Channel Sensing Intrinsic Amplitude: 0.5 mV
Lead Channel Sensing Intrinsic Amplitude: 6.3 mV
Lead Channel Setting Pacing Amplitude: 2.5 V
Lead Channel Setting Pacing Amplitude: 2.5 V
Lead Channel Setting Pacing Pulse Width: 0.5 ms
Lead Channel Setting Sensing Sensitivity: 2.5 mV
Pulse Gen Model: 2272
Pulse Gen Serial Number: 8996724

## 2021-12-08 ENCOUNTER — Other Ambulatory Visit: Payer: Self-pay | Admitting: Cardiology

## 2021-12-08 DIAGNOSIS — I25119 Atherosclerotic heart disease of native coronary artery with unspecified angina pectoris: Secondary | ICD-10-CM

## 2021-12-09 ENCOUNTER — Other Ambulatory Visit: Payer: Self-pay | Admitting: Cardiology

## 2021-12-09 DIAGNOSIS — I25119 Atherosclerotic heart disease of native coronary artery with unspecified angina pectoris: Secondary | ICD-10-CM

## 2021-12-10 ENCOUNTER — Other Ambulatory Visit: Payer: Self-pay

## 2021-12-10 ENCOUNTER — Encounter: Payer: Self-pay | Admitting: Family Medicine

## 2021-12-10 ENCOUNTER — Ambulatory Visit (INDEPENDENT_AMBULATORY_CARE_PROVIDER_SITE_OTHER): Payer: Medicare HMO | Admitting: Family Medicine

## 2021-12-10 VITALS — BP 124/80 | HR 94 | Ht 73.0 in | Wt 281.0 lb

## 2021-12-10 DIAGNOSIS — K219 Gastro-esophageal reflux disease without esophagitis: Secondary | ICD-10-CM

## 2021-12-10 DIAGNOSIS — K30 Functional dyspepsia: Secondary | ICD-10-CM

## 2021-12-10 DIAGNOSIS — Z1211 Encounter for screening for malignant neoplasm of colon: Secondary | ICD-10-CM | POA: Diagnosis not present

## 2021-12-10 DIAGNOSIS — Z23 Encounter for immunization: Secondary | ICD-10-CM

## 2021-12-10 DIAGNOSIS — D751 Secondary polycythemia: Secondary | ICD-10-CM

## 2021-12-10 DIAGNOSIS — R300 Dysuria: Secondary | ICD-10-CM | POA: Diagnosis not present

## 2021-12-10 LAB — POCT URINALYSIS DIP (MANUAL ENTRY)
Bilirubin, UA: NEGATIVE
Blood, UA: NEGATIVE
Glucose, UA: NEGATIVE mg/dL
Ketones, POC UA: NEGATIVE mg/dL
Leukocytes, UA: NEGATIVE
Nitrite, UA: NEGATIVE
Protein Ur, POC: 30 mg/dL — AB
Spec Grav, UA: 1.02 (ref 1.010–1.025)
Urobilinogen, UA: 0.2 E.U./dL
pH, UA: 7 (ref 5.0–8.0)

## 2021-12-10 LAB — POCT UA - MICROSCOPIC ONLY

## 2021-12-10 MED ORDER — PANTOPRAZOLE SODIUM 40 MG PO TBEC: 40.0000 mg | DELAYED_RELEASE_TABLET | Freq: Every day | ORAL | 3 refills | Status: DC

## 2021-12-10 NOTE — Progress Notes (Signed)
SUBJECTIVE:   CHIEF COMPLAINT / HPI:  Chief Complaint  Patient presents with   indigestion with all meals    Pepcid not helping   bloodwork results   urinary discomfort    Not in the past 2 days - U/A in lab    Here with wife Ryan Clarke today  Indigestion Patient reports he has had worsening acid reflux and indigestion for the past 5 months.  Indigestion is his anginal equivalent, but patient states that he has been seen by his cardiologist and tests have not been suggestive of ACS.  He feels indigestion is worse after eating, and will sometimes notice after walking.  He is taking famotidine every now and then but is unsure if it has been helping.  Wife had questions about lactose intolerance.  Denies diarrhea, vomiting.  Dysuria Intermittent dysuria and dribbling. Denies fever and incomplete bladder emptying.  No symptoms in the past few days.  History of UTI years ago requiring hospitalization.  Abnormal labs Patient had labs obtained by his cardiologist 2 weeks ago.  He was advised to discuss results with me.  He also mentioned wrist and arm pain.  PERTINENT  PMH / PSH: CAD, HTN, A. fib  Patient Care Team: [name], MD as PCP - General (Family Medicine) [name], MD as PCP - Family Medicine (Family Medicine) Buford Dresser, MD as PCP - Cardiology (Cardiology) Lavonna Monarch, MD as Consulting Physician (Dermatology)   OBJECTIVE:   BP 124/80    Pulse 94    Ht 6\' 1"  (1.854 m)    Wt 281 lb (127.5 kg)    SpO2 98%    BMI 37.07 kg/m   Physical Exam Constitutional:      General: He is not in acute distress. HENT:     Head: Normocephalic and atraumatic.  Cardiovascular:     Rate and Rhythm: Normal rate and regular rhythm.  Pulmonary:     Effort: Pulmonary effort is normal. No respiratory distress.     Breath sounds: Normal breath sounds.  Abdominal:     Palpations: Abdomen is soft.     Tenderness: There is no abdominal tenderness.  Neurological:     Mental  Status: He is alert.     Depression screen [facility] 2/9 12/10/2021  Decreased Interest 0  Down, Depressed, Hopeless 0  PHQ - 2 Score 0  Altered sleeping 1  Tired, decreased energy 1  Change in appetite 1  Feeling bad or failure about yourself  0  Trouble concentrating 0  Moving slowly or fidgety/restless 0  Suicidal thoughts 0  PHQ-9 Score 3  Difficult doing work/chores -     {Show previous vital signs (optional):23777}  Last CBC Lab Results  Component Value Date   WBC 7.3 11/27/2021   HGB 18.1 (H) 11/27/2021   HCT 53.1 (H) 11/27/2021   MCV 90 11/27/2021   MCH 30.7 11/27/2021   RDW 12.4 11/27/2021   PLT 232 11/27/2021    ASSESSMENT/PLAN:   GERD (gastroesophageal reflux disease) Indigestion and acid reflux consistent with GERD.  This has been an ongoing issue for months, he has had recent cardiac work-up which has been unrevealing. - start pantoprazole 40 mg daily - d/c famotidine - counseling provided - can trial avoiding lactose - consider GI referral if symptoms not improved for possible EGD - f/u 8 weeks   Erythrocytosis Mild, noted at cardiology office.  Possibly from dehydration. - repeat CBC today, consider further workup if still elevated  Dysuria UA not  indicative of UTI. - send urine culture - consider further tests if symptoms persist (consider GC/chlamydia, PSA)  HCM - Cologuard ordered - flu shot given - Covid bivalent booster declined  Discuss wrist and arm pain at follow-up.  Return in about 8 weeks (around 02/04/2022) for f/u reflux.   [name], MD [place]

## 2021-12-10 NOTE — Assessment & Plan Note (Addendum)
Indigestion and acid reflux consistent with GERD.  This has been an ongoing issue for months, he has had recent cardiac work-up which has been unrevealing. - start pantoprazole 40 mg daily - d/c famotidine - counseling provided - can trial avoiding lactose - consider GI referral if symptoms not improved for possible EGD - f/u 8 weeks

## 2021-12-10 NOTE — Patient Instructions (Addendum)
It was nice seeing you today!  Work on eating smaller meals. You can try cutting out dairy to see if this helps with your symptoms.  Take pantoprazole once a day. Stop the Pepcid.  Sending urine for culture.  Stay well, [name], MD [place] [phone]  --  Make sure to check out at the front desk before you leave today.  Please arrive at least 15 minutes prior to your scheduled appointments.  If you had blood work today, I will send you a MyChart message or a letter if results are normal. Otherwise, I will give you a call.  If you had a referral placed, they will call you to set up an appointment. Please give [place] a call if you don't hear back in the next 2 weeks.  If you need additional refills before your next appointment, please call your pharmacy first.

## 2021-12-11 LAB — CBC
Hematocrit: 50.7 % (ref 37.5–51.0)
Hemoglobin: 17.4 g/dL (ref 13.0–17.7)
MCH: 30.2 pg (ref 26.6–33.0)
MCHC: 34.3 g/dL (ref 31.5–35.7)
MCV: 88 fL (ref 79–97)
Platelets: 227 10*3/uL (ref 150–450)
RBC: 5.77 x10E6/uL (ref 4.14–5.80)
RDW: 12.7 % (ref 11.6–15.4)
WBC: 6.6 10*3/uL (ref 3.4–10.8)

## 2021-12-12 ENCOUNTER — Other Ambulatory Visit: Payer: Self-pay | Admitting: Family Medicine

## 2021-12-12 LAB — URINE CULTURE: Organism ID, Bacteria: NO GROWTH

## 2021-12-13 NOTE — Progress Notes (Signed)
Remote pacemaker transmission.   

## 2021-12-27 LAB — COLOGUARD: COLOGUARD: NEGATIVE

## 2022-01-21 ENCOUNTER — Other Ambulatory Visit: Payer: Self-pay | Admitting: Cardiology

## 2022-01-21 DIAGNOSIS — E7800 Pure hypercholesterolemia, unspecified: Secondary | ICD-10-CM

## 2022-01-21 NOTE — Telephone Encounter (Signed)
Rx(s) sent to pharmacy electronically.  

## 2022-02-25 ENCOUNTER — Encounter: Payer: Self-pay | Admitting: Dermatology

## 2022-02-25 ENCOUNTER — Ambulatory Visit: Payer: Medicare HMO | Admitting: Dermatology

## 2022-02-25 DIAGNOSIS — D485 Neoplasm of uncertain behavior of skin: Secondary | ICD-10-CM

## 2022-02-25 DIAGNOSIS — L570 Actinic keratosis: Secondary | ICD-10-CM | POA: Diagnosis not present

## 2022-02-25 DIAGNOSIS — Z85828 Personal history of other malignant neoplasm of skin: Secondary | ICD-10-CM

## 2022-02-25 DIAGNOSIS — Z86007 Personal history of in-situ neoplasm of skin: Secondary | ICD-10-CM | POA: Diagnosis not present

## 2022-02-25 DIAGNOSIS — D2361 Other benign neoplasm of skin of right upper limb, including shoulder: Secondary | ICD-10-CM

## 2022-02-25 MED ORDER — TRIAMCINOLONE ACETONIDE 40 MG/ML IJ SUSP
40.0000 mg | Freq: Once | INTRAMUSCULAR | Status: AC
  Administered 2022-02-25: 40 mg

## 2022-02-25 NOTE — Patient Instructions (Signed)

## 2022-03-03 ENCOUNTER — Ambulatory Visit (HOSPITAL_BASED_OUTPATIENT_CLINIC_OR_DEPARTMENT_OTHER): Payer: Medicare HMO | Admitting: Cardiology

## 2022-03-03 ENCOUNTER — Ambulatory Visit (INDEPENDENT_AMBULATORY_CARE_PROVIDER_SITE_OTHER): Payer: Self-pay

## 2022-03-03 DIAGNOSIS — Z950 Presence of cardiac pacemaker: Secondary | ICD-10-CM

## 2022-03-04 ENCOUNTER — Telehealth: Payer: Self-pay | Admitting: *Deleted

## 2022-03-04 LAB — CUP PACEART REMOTE DEVICE CHECK
Battery Remaining Longevity: 88 mo
Battery Remaining Percentage: 70 %
Battery Voltage: 2.99 V
Brady Statistic AP VP Percent: 1 %
Brady Statistic AP VS Percent: 1 %
Brady Statistic AS VP Percent: 1 %
Brady Statistic AS VS Percent: 98 %
Brady Statistic RA Percent Paced: 1 %
Brady Statistic RV Percent Paced: 1 %
Date Time Interrogation Session: 20230416231241
Implantable Lead Implant Date: 20190322
Implantable Lead Implant Date: 20190322
Implantable Lead Location: 753859
Implantable Lead Location: 753860
Implantable Pulse Generator Implant Date: 20190322
Lead Channel Impedance Value: 380 Ohm
Lead Channel Impedance Value: 460 Ohm
Lead Channel Pacing Threshold Amplitude: 1 V
Lead Channel Pacing Threshold Amplitude: 1 V
Lead Channel Pacing Threshold Pulse Width: 0.5 ms
Lead Channel Pacing Threshold Pulse Width: 1 ms
Lead Channel Sensing Intrinsic Amplitude: 0.4 mV
Lead Channel Sensing Intrinsic Amplitude: 6.6 mV
Lead Channel Setting Pacing Amplitude: 2.5 V
Lead Channel Setting Pacing Amplitude: 2.5 V
Lead Channel Setting Pacing Pulse Width: 0.5 ms
Lead Channel Setting Sensing Sensitivity: 2.5 mV
Pulse Gen Model: 2272
Pulse Gen Serial Number: 8996724

## 2022-03-04 NOTE — Telephone Encounter (Signed)
-----   Message from Lavonna Monarch, MD sent at 03/04/2022  6:45 AM EDT ----- ?This is an uncommon condition which will occasionally locally recur but is not considered to be skin cancer.  Routine follow-up can be on a as needed basis ?

## 2022-03-04 NOTE — Telephone Encounter (Signed)
Left patient message to call back for results.  

## 2022-03-11 ENCOUNTER — Ambulatory Visit (INDEPENDENT_AMBULATORY_CARE_PROVIDER_SITE_OTHER): Payer: Medicare HMO

## 2022-03-11 DIAGNOSIS — Z4802 Encounter for removal of sutures: Secondary | ICD-10-CM

## 2022-03-11 NOTE — Progress Notes (Signed)
Removed  sutures x 2. No signs of infection. Pt aware of results.  ?

## 2022-03-16 ENCOUNTER — Encounter: Payer: Self-pay | Admitting: Dermatology

## 2022-03-16 NOTE — Progress Notes (Signed)
? ?  Follow-Up Visit ?  ?Subjective  ?Ryan Clarke is a 66 y.o. male who presents for the following: Follow-up (F/u on right upper back sccis. ). ? ?Check previous skin cancers back, new growth arm, crust cheek ?Location:  ?Duration:  ?Quality:  ?Associated Signs/Symptoms: ?Modifying Factors:  ?Severity:  ?Timing: ?Context:  ? ?Objective  ?Well appearing patient in no apparent distress; mood and affect are within normal limits. ?Right Upper Back ?No sign residual skin cancer back in detail with keloid ? ?Left Lower Back ?No sign residual ? ?Right Forearm - Posterior ?Unusual grayish dermal papule with focal, diagnosis uncertain.  Dermoscopy amorphous but not clearly melanocytic or vascular. ? ? ? ? ? ? ?Right Buccal Cheek (2) ?2 gritty to hornlike 3 mm pink crusts ? ? ? ?All skin waist up examined. ? ? ?Assessment & Plan  ? ? ?History of squamous cell carcinoma in situ ?Right Upper Back ? ?triamcinolone acetonide (KENALOG-40) injection 40 mg - Right Upper Back ? ? ?Intralesional injection - Right Upper Back ?Location: right upper back ? ?Informed Consent: Discussed risks (infection, pain, bleeding, bruising, thinning of the skin, loss of skin pigment, lack of resolution, and recurrence of lesion) and benefits of the procedure, as well as the alternatives. Informed consent was obtained. ?Preparation: The area was prepared a standard fashion. ? ? ? ?Procedure Details: An intralesional injection was performed with Kenalog 40 mg/cc. 0.1 cc in total were injected. ? ?Total number of injections: 1 ? ?Plan: The patient was instructed on post-op care. Recommend OTC analgesia as needed for pain. ? ? ?History of basal cell carcinoma (BCC) ?Left Lower Back ? ?Recheck as needed change ? ?Neoplasm of uncertain behavior of skin ?Right Forearm - Posterior ? ?Skin / nail biopsy ?Type of biopsy: punch   ?Informed consent: discussed and consent obtained   ?Procedure prep:  Patient was prepped and draped in usual sterile fashion  (nonsterile) ?Prep type:  Chlorhexidine ?Anesthesia: the lesion was anesthetized in a standard fashion   ?Anesthetic:  1% lidocaine w/ epinephrine 1-100,000 local infiltration ?Punch size:  4 mm ?Suture size:  5-0 ?Suture removal (days):  14 ?Hemostasis achieved with: suture   ?Outcome: patient tolerated procedure well   ?Post-procedure details: wound care instructions given   ?Post-procedure details comment:  Nonsterile ?Additional details:  5-0 Look x 2  ? ?Specimen 1 - Surgical pathology ?Differential Diagnosis- dermatofibroma vs tumor  ? ?Check Margins: No ? ?Punch biopsy with 5/0 nylon closure ? ?AK (actinic keratosis) (2) ?Right Buccal Cheek ? ?Destruction of lesion - Right Buccal Cheek ?Complexity: simple   ?Destruction method: cryotherapy   ?Informed consent: discussed and consent obtained   ?Timeout:  patient name, date of birth, surgical site, and procedure verified ?Lesion destroyed using liquid nitrogen: Yes   ?Cryotherapy cycles:  3 ?Outcome: patient tolerated procedure well with no complications   ?Post-procedure details: wound care instructions given   ? ? ? ? ? ?I, Lavonna Monarch, MD, have reviewed all documentation for this visit.  The documentation on 03/16/22 for the exam, diagnosis, procedures, and orders are all accurate and complete. ?

## 2022-03-17 ENCOUNTER — Encounter (HOSPITAL_BASED_OUTPATIENT_CLINIC_OR_DEPARTMENT_OTHER): Payer: Self-pay

## 2022-03-20 NOTE — Progress Notes (Signed)
Remote pacemaker transmission.   

## 2022-04-03 ENCOUNTER — Other Ambulatory Visit (HOSPITAL_BASED_OUTPATIENT_CLINIC_OR_DEPARTMENT_OTHER): Payer: Self-pay | Admitting: *Deleted

## 2022-04-03 DIAGNOSIS — I10 Essential (primary) hypertension: Secondary | ICD-10-CM

## 2022-04-03 MED ORDER — CHLORTHALIDONE 25 MG PO TABS: 25.0000 mg | ORAL_TABLET | Freq: Every day | ORAL | 2 refills | Status: DC

## 2022-04-03 NOTE — Telephone Encounter (Signed)
Rx(s) sent to pharmacy electronically.  

## 2022-04-11 IMAGING — CT CT ANGIO CHEST
2 of 6 series · 18 of 36 positions shown · IV contrast (omnipaque)
Comparison: None.

CLINICAL DATA: Substernal chest pain

EXAM:
CT ANGIOGRAPHY CHEST WITH CONTRAST
TECHNIQUE: Multidetector CT imaging of the chest was performed using the
standard protocol during bolus administration of intravenous
contrast. Multiplanar CT image reconstructions and MIPs were
obtained to evaluate the vascular anatomy.
CONTRAST:  75mL OMNIPAQUE IOHEXOL 350 MG/ML SOLN

[Series 7: pe thins · axial · 0.98mm/px · z∈[+1234,+1506]mm · 17 of 433 slices shown]
[im 22/433  lung]
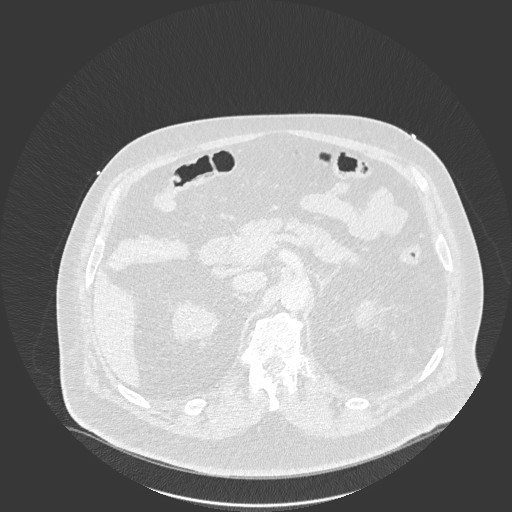
[im 44/433  mediastinal]
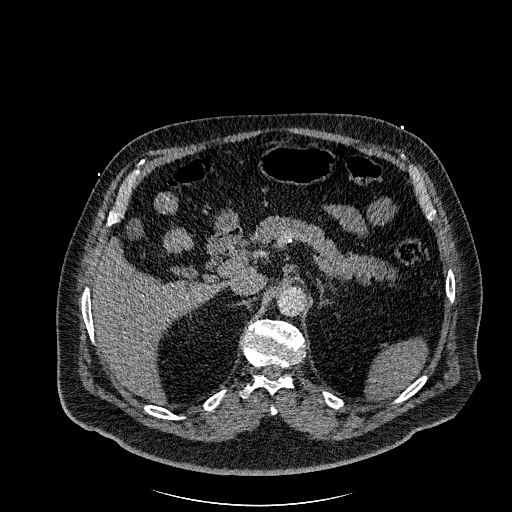
[im 65/433  lung]
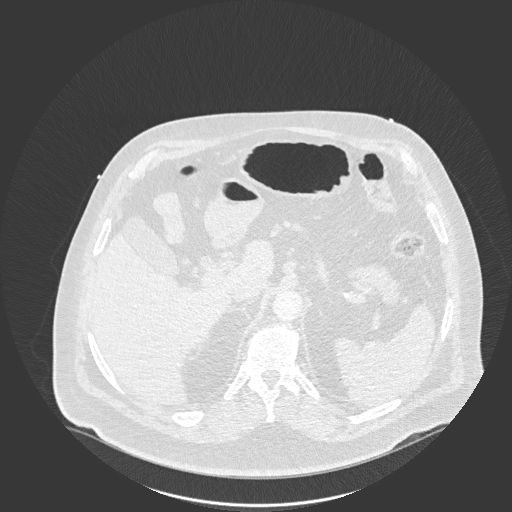
[im 87/433  mediastinal]
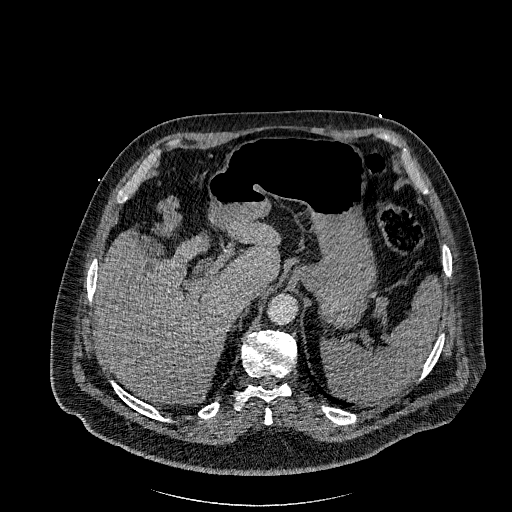
[im 130/433  lung]
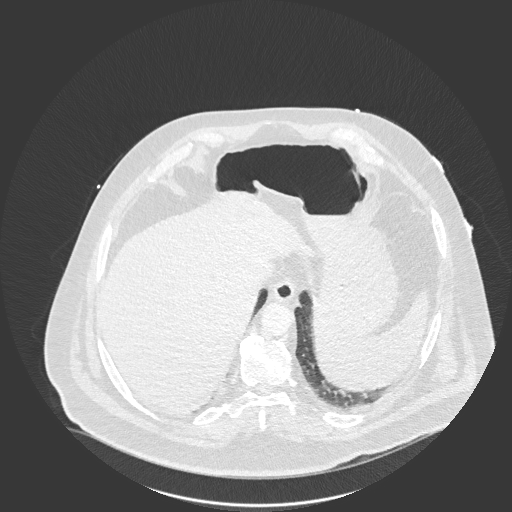
[im 152/433  mediastinal]
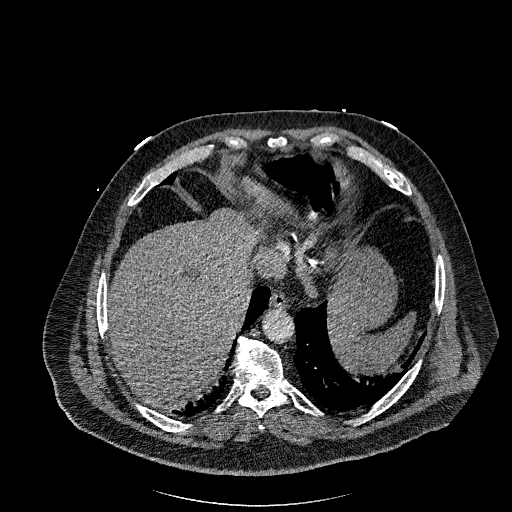
[im 173/433  lung]
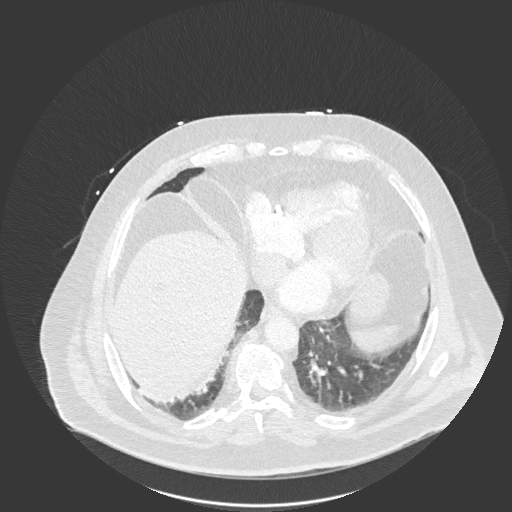
[im 195/433  mediastinal]
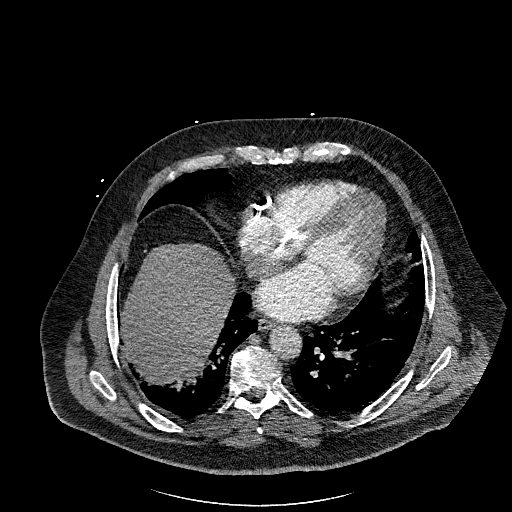
[im 217/433  lung]
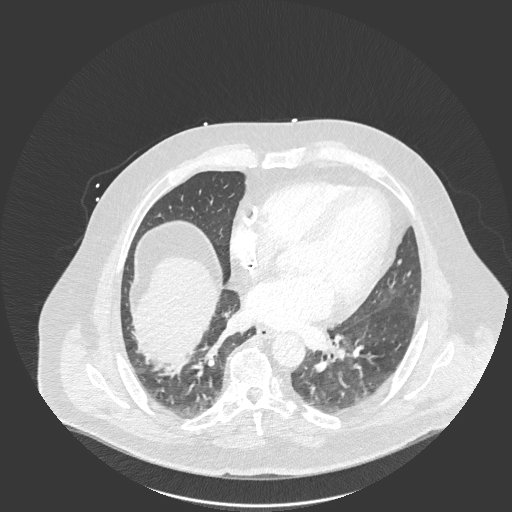
[im 238/433  mediastinal]
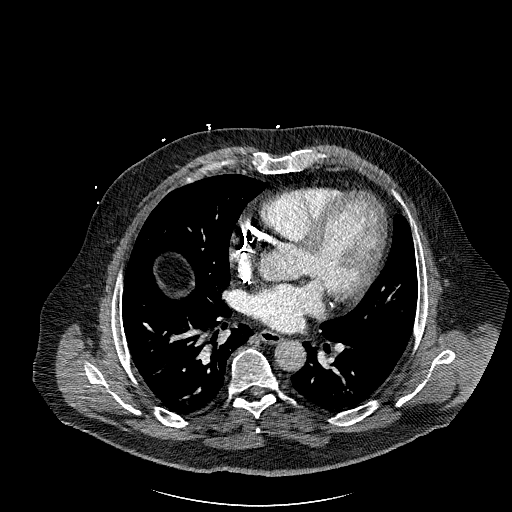
[im 260/433  lung]
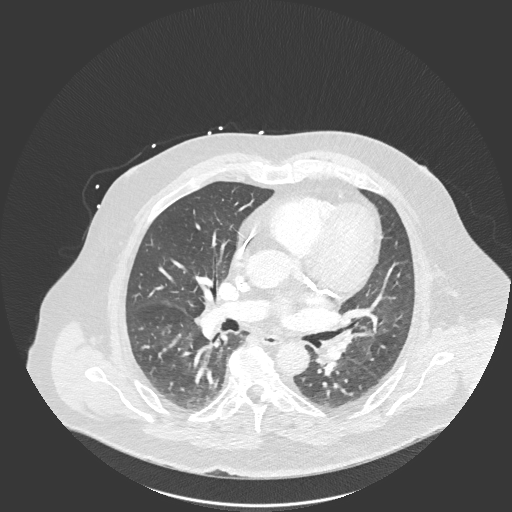
[im 281/433  mediastinal]
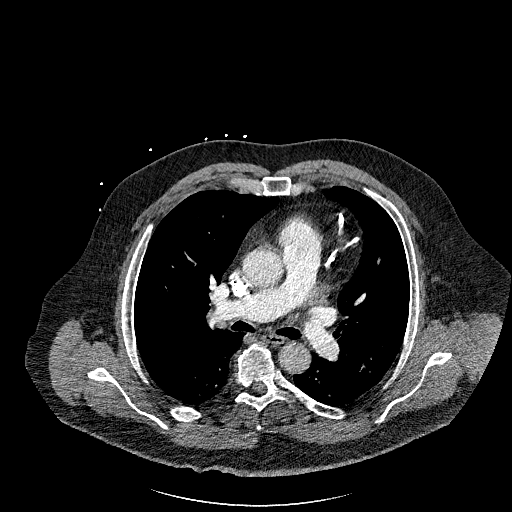
[im 303/433  lung]
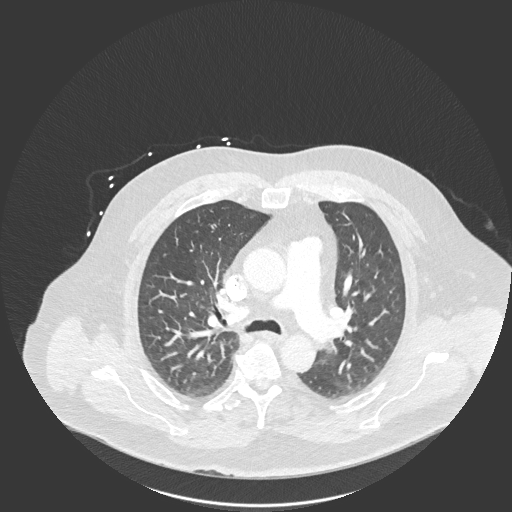
[im 346/433  mediastinal]
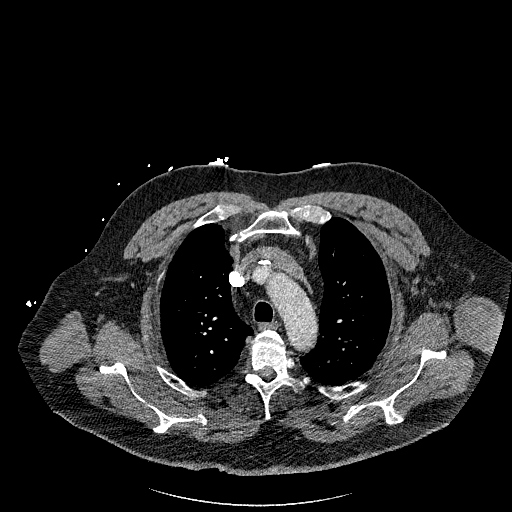
[im 368/433  lung]
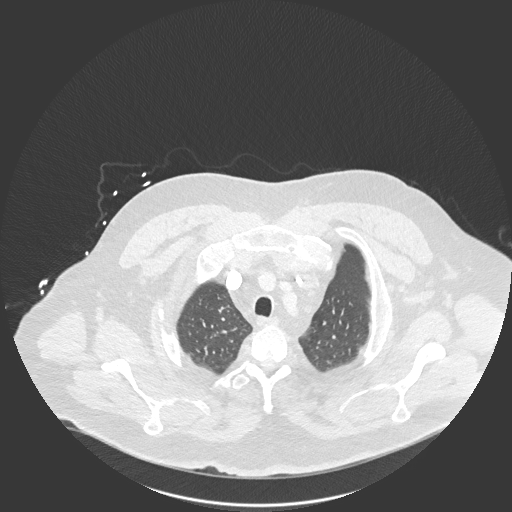
[im 389/433  mediastinal]
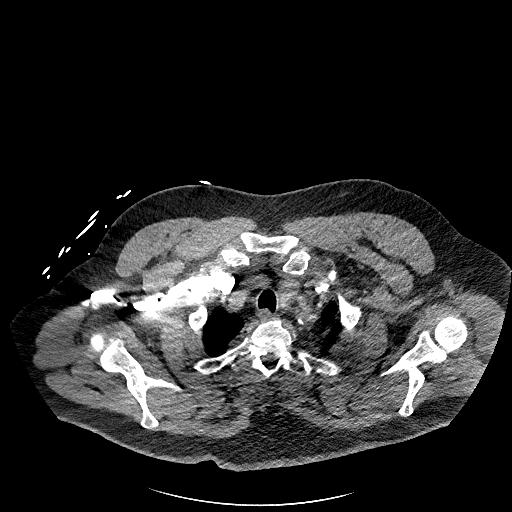
[im 411/433  lung]
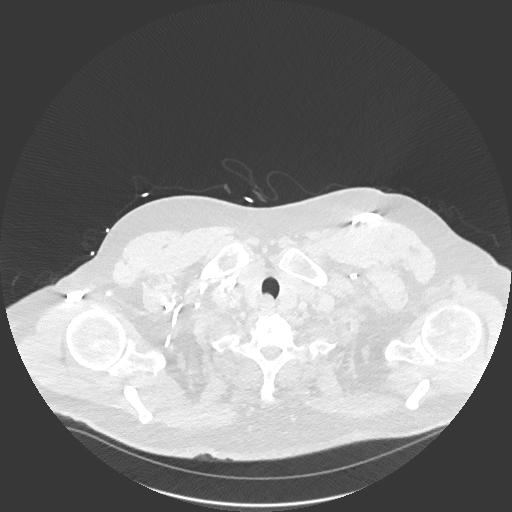

[Series 8: pe 2mm cor · coronal · 0.59mm/px · 1 of 190 slices shown]
[im 95/190  mediastinal]
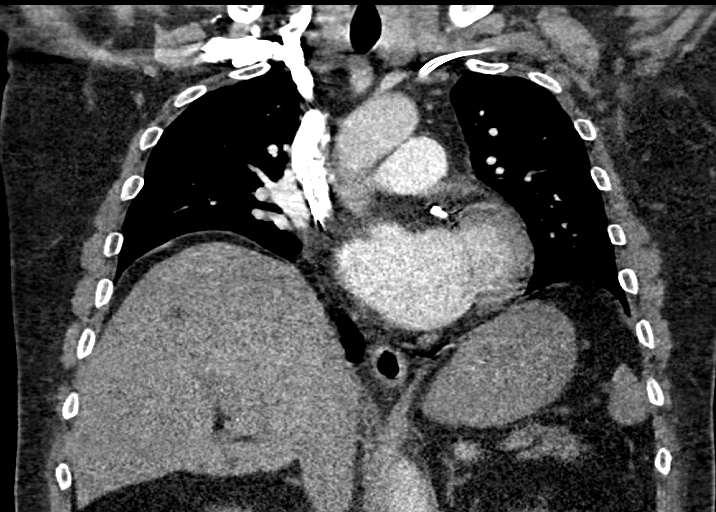

[18 of 36 positions shown; findings below may reference images not displayed]

FINDINGS: Cardiovascular: There is slightly suboptimal opacification of the
main pulmonary artery, however no central or proximal segmental
pulmonary embolism is seen. The heart is normal in size. No
pericardial effusion or thickening. No evidence right heart strain.
There is normal three-vessel brachiocephalic anatomy without
proximal stenosis. Coronary artery calcifications are seen.

Mediastinum/Nodes: No hilar, mediastinal, or axillary adenopathy.
Thyroid gland, trachea, and esophagus demonstrate no significant
findings. A left-sided pacemaker is present.

Lungs/Pleura: Minimal ground-glass opacities are seen at both lung
bases. No pleural effusion or pneumothorax. No airspace
consolidation.

Upper Abdomen: Multiple low-density lesions are seen throughout the
liver parenchyma. Tiny calcifications seen within the gallbladder.

Musculoskeletal: No chest wall abnormality. No acute or significant
osseous findings.

Review of the MIP images confirms the above findings.
IMPRESSION: Slightly suboptimal opacification of the main pulmonary artery,
however no central or proximal segmental pulmonary embolism.

No acute intrathoracic pathology to explain the patient's symptoms.

Nonspecific low-density lesions within the liver.

Cholelithiasis

## 2022-04-22 ENCOUNTER — Encounter: Payer: Self-pay | Admitting: *Deleted

## 2022-05-06 ENCOUNTER — Telehealth: Payer: Self-pay | Admitting: Cardiology

## 2022-05-06 DIAGNOSIS — I480 Paroxysmal atrial fibrillation: Secondary | ICD-10-CM

## 2022-05-06 MED ORDER — APIXABAN 5 MG PO TABS: 5.0000 mg | ORAL_TABLET | Freq: Two times a day (BID) | ORAL | 1 refills | Status: DC

## 2022-05-06 MED ORDER — APIXABAN 5 MG PO TABS: 5.0000 mg | ORAL_TABLET | Freq: Two times a day (BID) | ORAL | 6 refills | Status: DC

## 2022-05-06 NOTE — Telephone Encounter (Signed)
*  STAT* If patient is at the pharmacy, call can be transferred to refill team.   1. Which medications need to be refilled? (please list name of each medication and dose if known) apixaban (ELIQUIS) 5 MG TABS tablet  2. Which pharmacy/location (including street and city if local pharmacy) is medication to be sent to? [facility] [place], [place] [facility] [place]  3. Do they need a 30 day or 90 day supply? 30 day  Patient is out of medication.

## 2022-05-06 NOTE — Telephone Encounter (Signed)
Please review for refill of Eliquis. Thank you!  

## 2022-05-07 MED ORDER — APIXABAN 5 MG PO TABS: 5.0000 mg | ORAL_TABLET | Freq: Two times a day (BID) | ORAL | 1 refills | Status: DC

## 2022-05-07 NOTE — Telephone Encounter (Signed)
Prescription refill request for Eliquis received. Indication: a fib Last office visit: 11/27/21 Scr: 1.07 Age: 66 Weight: 127kg

## 2022-05-26 ENCOUNTER — Encounter (HOSPITAL_BASED_OUTPATIENT_CLINIC_OR_DEPARTMENT_OTHER): Payer: Self-pay

## 2022-05-28 LAB — BASIC METABOLIC PANEL
BUN/Creatinine Ratio: 13 (ref 10–24)
BUN: 13 mg/dL (ref 8–27)
CO2: 28 mmol/L (ref 20–29)
Calcium: 9.7 mg/dL (ref 8.6–10.2)
Chloride: 98 mmol/L (ref 96–106)
Creatinine, Ser: 1.04 mg/dL (ref 0.76–1.27)
Glucose: 108 mg/dL — ABNORMAL HIGH (ref 70–99)
Potassium: 3.6 mmol/L (ref 3.5–5.2)
Sodium: 145 mmol/L — ABNORMAL HIGH (ref 134–144)
eGFR: 79 mL/min/{1.73_m2} (ref >59)

## 2022-05-28 LAB — LIPID PANEL
Chol/HDL Ratio: 2.9 ratio (ref 0.0–5.0)
Cholesterol, Total: 115 mg/dL (ref 100–199)
HDL: 39 mg/dL — ABNORMAL LOW (ref >39)
LDL Chol Calc (NIH): 55 mg/dL (ref 0–99)
Triglycerides: 116 mg/dL (ref 0–149)
VLDL Cholesterol Cal: 21 mg/dL (ref 5–40)

## 2022-05-30 ENCOUNTER — Ambulatory Visit (HOSPITAL_BASED_OUTPATIENT_CLINIC_OR_DEPARTMENT_OTHER): Payer: Medicare HMO | Admitting: Cardiology

## 2022-05-30 ENCOUNTER — Encounter (HOSPITAL_BASED_OUTPATIENT_CLINIC_OR_DEPARTMENT_OTHER): Payer: Self-pay | Admitting: Cardiology

## 2022-05-30 VITALS — BP 110/70 | HR 58 | Ht 73.0 in | Wt 285.0 lb

## 2022-05-30 DIAGNOSIS — Z950 Presence of cardiac pacemaker: Secondary | ICD-10-CM | POA: Diagnosis not present

## 2022-05-30 DIAGNOSIS — I2510 Atherosclerotic heart disease of native coronary artery without angina pectoris: Secondary | ICD-10-CM | POA: Diagnosis not present

## 2022-05-30 DIAGNOSIS — E7800 Pure hypercholesterolemia, unspecified: Secondary | ICD-10-CM

## 2022-05-30 DIAGNOSIS — Z951 Presence of aortocoronary bypass graft: Secondary | ICD-10-CM

## 2022-05-30 DIAGNOSIS — I480 Paroxysmal atrial fibrillation: Secondary | ICD-10-CM | POA: Diagnosis not present

## 2022-05-30 DIAGNOSIS — I10 Essential (primary) hypertension: Secondary | ICD-10-CM

## 2022-05-30 NOTE — Progress Notes (Signed)
Cardiology Office Note:    Date:  05/30/2022   ID:  Ryan Clarke, DOB [birth year], MRN [id number]  PCP:  [name], MD  Cardiologist:  Buford Dresser, MD  EP: Dr. Caryl Comes  CC: follow up  History of Present Illness:    Ryan Clarke is a 66 y.o. male with a hx of CAD with many stents remotely, now s/p CABGx2 11/2020, pacemaker who is seen for close follow up. I initially met him 08/14/20 as a referral for palpitations, but on discussion discovered extensive CAD history and angina  Cardiac history: Had all of his prior cardiac care as part of [facility] in [place]. Not available in Care Everywhere. Reports history of 29 stents, last stent 06/2019. Moved to [place] in 05/2020. S/P CABGx2 by Dr. Cyndia Bent on 5/97/41, complicated by post op atrial fibrillation. Due to recurrence of paroxysmal atrial fib, continued on anticoagulation.  Today: Here with his wife today. Doing well overall. Still dealing with GERD, feels occasional burning after eating, cough when lying down. No exertional chest pain. Working to renovate his daughter's bathroom.   Had a brief time when he smelled alcohol type smell with eliquis, now resolved. Had one brief episode of palpitations about a month ago, remote check was normal.   Reviewed labs. LDL at goal at 55, prior was 35, but his diet has relaxed somewhat.  Denies chest pain, shortness of breath at rest or with normal exertion. No PND, orthopnea, LE edema or unexpected weight gain. No syncope or palpitations.   Has hemorrhoids but no blood mixed with stool.  Past Medical History:  Diagnosis Date   Carpal tunnel syndrome 09/10/2020   Coronary artery disease    Hypertension    Nodular basal cell carcinoma (BCC) 09/04/2021   Left Lower Back    Past Surgical History:  Procedure Laterality Date   CORONARY ARTERY BYPASS GRAFT N/A 11/26/2020   Procedure: CORONARY ARTERY BYPASS GRAFTING (CABG) TIMES TWO USING BILATERAL INTERNAL MAMMARY ARTERIES;  Surgeon: Gaye Pollack, MD;  Location: [facility];  Service: Open Heart Surgery;  Laterality: N/A;  BIMA   CORONARY BALLOON ANGIOPLASTY N/A 09/14/2020   Procedure: CORONARY BALLOON ANGIOPLASTY;  Surgeon: Troy Sine, MD;  Location: [facility];  Service: Cardiovascular;  Laterality: N/A;   CORONARY STENT PLACEMENT     LEFT HEART CATH AND CORONARY ANGIOGRAPHY N/A 09/14/2020   Procedure: LEFT HEART CATH AND CORONARY ANGIOGRAPHY;  Surgeon: Troy Sine, MD;  Location: [facility];  Service: Cardiovascular;  Laterality: N/A;   LEFT HEART CATH AND CORONARY ANGIOGRAPHY N/A 11/19/2020   Procedure: LEFT HEART CATH AND CORONARY ANGIOGRAPHY;  Surgeon: Troy Sine, MD;  Location: [facility];  Service: Cardiovascular;  Laterality: N/A;   PACEMAKER INSERTION     TEE WITHOUT CARDIOVERSION N/A 11/26/2020   Procedure: TRANSESOPHAGEAL ECHOCARDIOGRAM (TEE);  Surgeon: Gaye Pollack, MD;  Location: [facility];  Service: Open Heart Surgery;  Laterality: N/A;    Current Medications: Current Outpatient Medications on File Prior to Visit  Medication Sig   apixaban (ELIQUIS) 5 MG TABS tablet Take 1 tablet (5 mg total) by mouth 2 (two) times daily.   atorvastatin (LIPITOR) 80 MG tablet TAKE 1 TABLET BY MOUTH EVERYDAY AT BEDTIME   chlorthalidone (HYGROTON) 25 MG tablet Take 1 tablet (25 mg total) by mouth daily.   diclofenac Sodium (VOLTAREN) 1 % GEL Apply 4 g topically 4 (four) times daily.   metoprolol tartrate (LOPRESSOR) 25 MG tablet TAKE  1 TABLET BY MOUTH TWICE A DAY   pantoprazole (PROTONIX) 40 MG tablet TAKE 1 TABLET BY MOUTH EVERY DAY   prasugrel (EFFIENT) 10 MG TABS tablet TAKE 1 TABLET BY MOUTH EVERY DAY   No current facility-administered medications on file prior to visit.     Allergies:   Patient has no known allergies.   Social History   Tobacco Use   Smoking status: Never   Smokeless tobacco: Never  Substance Use Topics   Alcohol use: Never   Drug use: Never    Family History: No  premature history of CAD  ROS:   Please see the history of present illness.   Additional pertinent ROS otherwise unremarkable   EKGs/Labs/Other Studies Reviewed:    The following studies were reviewed today:  TEE 11/26/2020: POST-OP IMPRESSIONS  - Left Ventricle: The left ventricle is unchanged from pre-bypass.  - Right Ventricle: The right ventricle appears unchanged from pre-bypass.  - Aorta: The aorta appears unchanged from pre-bypass.  - Left Atrium: The left atrium appears unchanged from pre-bypass.  - Left Atrial Appendage: The left atrial appendage appears unchanged from  pre-bypass.  - Aortic Valve: The aortic valve appears unchanged from pre-bypass.  - Mitral Valve: The mitral valve appears unchanged from pre-bypass.  - Tricuspid Valve: The tricuspid valve appears unchanged from pre-bypass.  - Interatrial Septum: The interatrial septum appears unchanged from  pre-bypass.  - Interventricular Septum: The interventricular septum appears unchanged  from  pre-bypass.  - Pericardium: The pericardium appears unchanged from pre-bypass.  - Comments: post bypass EF similar to pre surgery  50-55%  No RWMA.   Pre CABG Carotid Doppler 11/20/2020: Summary:  Right Carotid: Velocities in the right ICA are consistent with a 1-39%  stenosis.   Left Carotid: Velocities in the left ICA are consistent with a 1-39%  stenosis.   Right ABI: Resting right ankle-brachial index is within normal range. No  evidence of significant right lower extremity arterial disease.  Left ABI: Resting left ankle-brachial index is within normal range. No  evidence of significant left lower extremity arterial disease.  Right Upper Extremity: Doppler waveforms remain within normal limits with  right radial compression. Doppler waveforms remain within normal limits  with right ulnar compression.  Left Upper Extremity: Doppler waveforms remain within normal limits with  left radial compression. Doppler  waveforms remain within normal limits  with left ulnar compression.   Echo 11/19/2020: 1. Left ventricular ejection fraction, by estimation, is 55 to 60%. The  left ventricle has normal function. The left ventricle has no regional  wall motion abnormalities. There is mild concentric left ventricular  hypertrophy. Left ventricular diastolic  parameters are consistent with Grade I diastolic dysfunction (impaired  relaxation).   2. Right ventricular systolic function is normal. The right ventricular  size is normal.   3. The mitral valve is normal in structure. No evidence of mitral valve  regurgitation. No evidence of mitral stenosis.   4. The aortic valve is tricuspid. Aortic valve regurgitation is not  visualized. No aortic stenosis is present.   5. Aortic dilatation noted. There is mild dilatation at the level of the  sinuses of Valsalva, measuring 39 mm.   6. The inferior vena cava is normal in size with greater than 50%  respiratory variability, suggesting right atrial pressure of 3 mmHg.   LHC 11/19/2020: 1st Mrg lesion is 40% stenosed. Prox Cx to Mid Cx lesion is 80% stenosed. Non-stenotic RPAV lesion was previously treated. Ost RCA to  Dist RCA lesion is 5% stenosed. Dist LM to Ost LAD lesion is 80% stenosed. Mid LAD lesion is 60% stenosed with 60% stenosed side branch in 1st Sept. Previously placed 2nd Diag stent (unknown type) is widely patent.   Multivessel CAD with stents in the LAD, diagonal, circumflex, circumflex marginal, and RCA extending to the PLA takeoff.   The LAD has progressive 80% ostial stenosis which seems to occur immediately proximal to the proximal stent.  There is diffuse 60% narrowing in the proximal and mid stented segment.  The diagonal stent is patent.  The distal portion of the mid LAD stent is patent.   Left circumflex stent extending from the ostium to the mid AV groove circumflex with focal eccentric 80% restenosis in the mid circumflex immediately  after the takeoff of the marginal vessel.  There is 30 to 40% ostial narrowing in th marginal vessel which is stented ostially to proximally.   The RCA is diffusely stented from the ostium to the takeoff of the PLA vessel.  There is mild luminal irregularity.   Preserved global LV contractility with EF estimate at 18 mmHg.   RECOMMENDATION: The angiographic findings were reviewed with Dr. Gwenlyn Found in the catheterization laboratory.  With the ostial progression of 80% in the LAD as well as again restenosis in the circumflex vessel recommend surgical consultation for optimal long-term benefit.  We will hold prasugrel.  Increase anti-ischemic medications, optimal lipid management and BP control.   Cath 09/14/20 Previously placed RPAV stent (unknown type) is widely patent. Ost RCA to Dist RCA lesion is 5% stenosed. Previously placed Prox LAD to Dist LAD stent (unknown type) is widely patent. Ost LAD lesion is 50% stenosed. Prox Cx to Mid Cx lesion is 99% stenosed. 1st Mrg lesion is 30% stenosed. Post intervention, there is a 30% residual stenosis. Post intervention, there is a 20% residual stenosis. The left ventricular systolic function is normal. LV end diastolic pressure is normal.   Multivessel diffuse stenting with multiple stents inserted from the ostium of the LAD to the distal third of the LAD with focal 50% ostial narrowing; stenting of the proximal to mid circumflex vessel with bifurcation stenting of the OM vessel with 99% stenosis with thrombus in the circumflex stent immediately after the takeoff of the stented OM vessel with TIMI 0-I flow down the distal circumflex; and diffuse stenting of the entire dominant RCA extending from the ostium to the PDA without significant restenosis.   Preserved global LV function   Very difficult intervention with initial plaque shifting into the OM vessel requiring ultimate PTCA of both the OM ostium as well as the circumflex stent with Cutting  Balloon intervention to the circumflex stent due to persistent intimal hyperplasia not resolved with noncompliant balloon dilatation.  Ultimate kissing balloon technique to the OM and circumflex with a residual narrowing in the OM at 20% and in the circumflex at  30% with resultant brisk TIMI-3 flow to the distal circumflex vessel supplying several additional vessels.   RECOMMENDATION: Long-term DAPT indefinitely due to the extensive stenting in this patient who previously had 29 stents placed in all of his major coronary arteries.  Aggressive lipid-lowering therapy with target LDL in the 50s or below with optimal blood pressure control.   EKG:   EKG is personally reviewed. 09/30/2021: sinus bradycardia at 59 bpm, 1st degree AV block, RBBB 04/03/2021: sinus bradycardia at 51 bpm, 1st degree AV block, RBBB, prior inferior infarct 10/01/2020: sinus bradycardia/sinus arrhythma, RBBB, 1st degree AV  block, prior inferior infarct  Recent Labs: 12/10/2021: Hemoglobin 17.4; Platelets 227 05/27/2022: BUN 13; Creatinine, Ser 1.04; Potassium 3.6; Sodium 145   Recent Lipid Panel    Component Value Date/Time   CHOL 115 05/27/2022 1011   TRIG 116 05/27/2022 1011   HDL 39 (L) 05/27/2022 1011   CHOLHDL 2.9 05/27/2022 1011   LDLCALC 55 05/27/2022 1011   LDLDIRECT 57 04/26/2021 1100    Physical Exam:    VS:  BP 110/70   Pulse (!) 58   Ht 6' 1" (1.854 m)   Wt 285 lb (129.3 kg)   SpO2 94%   BMI 37.60 kg/m     Wt Readings from Last 3 Encounters:  05/30/22 285 lb (129.3 kg)  12/10/21 281 lb (127.5 kg)  11/27/21 282 lb 3.2 oz (128 kg)    GEN: Well nourished, well developed in no acute distress HEENT: Normal, moist mucous membranes NECK: No JVD CARDIAC: regular rhythm, normal S1 and S2, no rubs or gallops. No murmur. VASCULAR: Radial and DP pulses 2+ bilaterally. No carotid bruits RESPIRATORY:  Clear to auscultation without rales, wheezing or rhonchi  ABDOMEN: Soft, non-tender,  non-distended MUSCULOSKELETAL:  Ambulates independently SKIN: Warm and dry, no edema NEUROLOGIC:  Alert and oriented x 3. No focal neuro deficits noted. PSYCHIATRIC:  Normal affect    ASSESSMENT:    1. Coronary artery disease involving native coronary artery of native heart without angina pectoris   2. S/P CABG x 2   3. Cardiac pacemaker in situ   4. Paroxysmal atrial fibrillation (HCC)   5. Essential hypertension   6. Pure hypercholesterolemia    PLAN:    Coronary artery disease, with reported 29 prior stents in [place] (no records), now s/p 2V CABG 11/2020 -no recent angina -instructed on red flag warning signs that need immediate medical attention -continue prasugrel + apixaban vs. Change to clopidogrel in the future. We discussed, doing well on current regimen, continue prasugrel at this time -continue atorvastatin -continue metoprolol. Consider imdur, amlodipine if additional anti-anginals needed  S/P Dual chamber [employer] pacemaker -established with device clinic  Paroxysmal atrial fibrillation:  -sinus by exam today -CHA2DS2/VAS Stroke Risk Points=3 -now on prasugrel and apixaban. Will continue to follow the data re: antiplatelet and anticoagulation recommendations  Hypertension: at goal today -continue chlorthalidone -Had prior leg swelling on amlodipine. -continue metoprolol  Hyperlipidemia: -LDL goal <70, LDL 57 on lipids 04/26/21 per [facility] -continue atorvastatin  Cardiac risk counseling and prevention recommendations: -recommend heart healthy/Mediterranean diet, with whole grains, fruits, vegetable, fish, lean meats, nuts, and olive oil. Limit salt. -recommend moderate walking, 3-5 times/week for 30-50 minutes each session. Aim for at least 150 minutes.week. Goal should be pace of 3 miles/hours, or walking 1.5 miles in 30 minutes -recommend avoidance of tobacco products. Avoid excess alcohol.  Plan for follow up: 6 mos or sooner as needed  Buford Dresser,  MD, PhD [place]  [facility]    Medication Adjustments/Labs and Tests Ordered: Current medicines are reviewed at length with the patient today.  Concerns regarding medicines are outlined above.  No orders of the defined types were placed in this encounter.  No orders of the defined types were placed in this encounter.  Patient Instructions  Medication Instructions:  Your Physician recommend you continue on your current medication as directed.    *If you need a refill on your cardiac medications before your next appointment, please call your pharmacy*   Lab Work: None ordered today  Testing/Procedures: None ordered today   Follow-Up: At [facility], you and your health needs are our priority.  As part of our continuing mission to provide you with exceptional heart care, we have created designated Provider Care Teams.  These Care Teams include your primary Cardiologist (physician) and Advanced Practice Providers (APPs -  Physician Assistants and Nurse Practitioners) who all work together to provide you with the care you need, when you need it.  We recommend signing up for the patient portal called "MyChart".  Sign up information is provided on this After Visit Summary.  MyChart is used to connect with patients for Virtual Visits (Telemedicine).  Patients are able to view lab/test results, encounter notes, upcoming appointments, etc.  Non-urgent messages can be sent to your provider as well.   To learn more about what you can do with MyChart, go to [email or web address].    Your next appointment:   6 month(s)  The format for your next appointment:   In Person  Provider:   Buford Dresser, MD{          Signed, Buford Dresser, MD PhD 05/30/2022   [place]

## 2022-05-30 NOTE — Patient Instructions (Signed)

## 2022-06-02 ENCOUNTER — Ambulatory Visit (INDEPENDENT_AMBULATORY_CARE_PROVIDER_SITE_OTHER): Payer: Medicare HMO

## 2022-06-02 DIAGNOSIS — I480 Paroxysmal atrial fibrillation: Secondary | ICD-10-CM | POA: Diagnosis not present

## 2022-06-03 LAB — CUP PACEART REMOTE DEVICE CHECK
Battery Remaining Longevity: 86 mo
Battery Remaining Percentage: 68 %
Battery Voltage: 2.99 V
Brady Statistic AP VP Percent: 1 %
Brady Statistic AP VS Percent: 1 %
Brady Statistic AS VP Percent: 1 %
Brady Statistic AS VS Percent: 98 %
Brady Statistic RA Percent Paced: 1 %
Brady Statistic RV Percent Paced: 1 %
Date Time Interrogation Session: 20230716040019
Implantable Lead Implant Date: 20190322
Implantable Lead Implant Date: 20190322
Implantable Lead Location: 753859
Implantable Lead Location: 753860
Implantable Pulse Generator Implant Date: 20190322
Lead Channel Impedance Value: 380 Ohm
Lead Channel Impedance Value: 540 Ohm
Lead Channel Pacing Threshold Amplitude: 1 V
Lead Channel Pacing Threshold Amplitude: 1 V
Lead Channel Pacing Threshold Pulse Width: 0.5 ms
Lead Channel Pacing Threshold Pulse Width: 1 ms
Lead Channel Sensing Intrinsic Amplitude: 0.4 mV
Lead Channel Sensing Intrinsic Amplitude: 6.3 mV
Lead Channel Setting Pacing Amplitude: 2.5 V
Lead Channel Setting Pacing Amplitude: 2.5 V
Lead Channel Setting Pacing Pulse Width: 0.5 ms
Lead Channel Setting Sensing Sensitivity: 2.5 mV
Pulse Gen Model: 2272
Pulse Gen Serial Number: 8996724

## 2022-06-23 ENCOUNTER — Ambulatory Visit (INDEPENDENT_AMBULATORY_CARE_PROVIDER_SITE_OTHER): Payer: Medicare HMO | Admitting: Family Medicine

## 2022-06-23 VITALS — BP 118/82 | HR 61 | Ht 73.0 in | Wt 284.5 lb

## 2022-06-23 DIAGNOSIS — Z0100 Encounter for examination of eyes and vision without abnormal findings: Secondary | ICD-10-CM

## 2022-06-23 DIAGNOSIS — H109 Unspecified conjunctivitis: Secondary | ICD-10-CM | POA: Insufficient documentation

## 2022-06-23 MED ORDER — ERYTHROMYCIN 5 MG/GM OP OINT: 1.0000 | TOPICAL_OINTMENT | Freq: Four times a day (QID) | OPHTHALMIC | 0 refills | Status: AC

## 2022-06-23 NOTE — Patient Instructions (Signed)
It was great seeing you today!  Today we discussed your eye, it seems you may have an eye infection. I have prescribed antibiotic eye drops, please insert this 4 times a day for 5 days.   If you have increasingly worsening pain, swelling or vision changes then please return to the clinic.   Please follow up at your next scheduled appointment, if anything arises between now and then, please don't hesitate to contact our office.   Thank you for allowing [place] to be a part of your medical care!  Thank you, Dr. Larae Grooms

## 2022-06-23 NOTE — Assessment & Plan Note (Signed)
-  conjunctivitis that may be viral etiology but most likely bacterial so prescribed erythromycin eye drops for 5 days, instructed to complete course even if symptoms improve so that possible infection is cleared -strict return precautions discussed -ophthalmology referral placed, discussed importance of annual eye exams

## 2022-06-23 NOTE — Progress Notes (Signed)
    SUBJECTIVE:   CHIEF COMPLAINT / HPI:   Patient presents with eye itching and was crusted over this morning. Feels that his sinuses hurt and has had a cough. Denies fever, chills, nausea and vomiting. 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 Fiance thought it was initially allergies but then woke up this morning with his eye swollen. Sinuses have been intermittent for the pat month but the eye-related symptoms started about 2 days ago. He is able to open his left eye but it is painful. No sudden eye pain, it gradually came on. Rates pain 3/10, which is the worst it has been. Last went to the ophthalmologist 2 years ago. Denies wearing contact lenses. Has not been in the pool recently.   OBJECTIVE:   BP 118/82   Pulse 61   Ht '6\' 1"'$  (1.854 m)   Wt 284 lb 8 oz (129 kg)   SpO2 96%   BMI 37.54 kg/m   General: Patient in no acute distress. HEENT: erythema noted along left eyelid, mild conjunctival injection without drainage, presence of periorbital erythema without edema, no obvious foreign body present, intact extraocular movements  Resp: normal work of breathing noted Neuro: PERRLA Psych: mood appropriate   ASSESSMENT/PLAN:   Bacterial conjunctivitis -conjunctivitis that may be viral etiology but most likely bacterial so prescribed erythromycin eye drops for 5 days, instructed to complete course even if symptoms improve so that possible infection is cleared -strict return precautions discussed -ophthalmology referral placed, discussed importance of annual eye exams   -PHQ-9 score of 1 with negative question 9 reviewed.    Donney Dice, [place]

## 2022-07-03 NOTE — Progress Notes (Signed)
Remote pacemaker transmission.   

## 2022-07-16 ENCOUNTER — Telehealth (HOSPITAL_BASED_OUTPATIENT_CLINIC_OR_DEPARTMENT_OTHER): Payer: Self-pay | Admitting: *Deleted

## 2022-07-16 ENCOUNTER — Encounter (HOSPITAL_BASED_OUTPATIENT_CLINIC_OR_DEPARTMENT_OTHER): Payer: Self-pay

## 2022-07-16 NOTE — Telephone Encounter (Addendum)
Eliquis 5 mg #2 Lot [id number] epx 02/2024 left at front desk for pick up   See mychart message

## 2022-07-17 ENCOUNTER — Encounter (HOSPITAL_BASED_OUTPATIENT_CLINIC_OR_DEPARTMENT_OTHER): Payer: Self-pay

## 2022-07-18 NOTE — Telephone Encounter (Signed)
Did yall receive paperwork on this guy?

## 2022-07-22 ENCOUNTER — Ambulatory Visit (INDEPENDENT_AMBULATORY_CARE_PROVIDER_SITE_OTHER): Payer: Medicare HMO | Admitting: Student

## 2022-07-22 ENCOUNTER — Encounter: Payer: Self-pay | Admitting: Student

## 2022-07-22 DIAGNOSIS — H0014 Chalazion left upper eyelid: Secondary | ICD-10-CM | POA: Diagnosis not present

## 2022-07-22 DIAGNOSIS — M1990 Unspecified osteoarthritis, unspecified site: Secondary | ICD-10-CM | POA: Insufficient documentation

## 2022-07-22 DIAGNOSIS — H0013 Chalazion right eye, unspecified eyelid: Secondary | ICD-10-CM | POA: Insufficient documentation

## 2022-07-22 NOTE — Progress Notes (Signed)
    SUBJECTIVE:   CHIEF COMPLAINT / HPI:   Ryan Clarke is here for continued eye irritation.  He was seen on 06/23/2022 here in the clinic and diagnosed with bacterial conjunctivitis treated appropriately with erythromycin eyedrops for 5 days.  Today, he has a small bump over his left upper eyelid.  He used the erythromycin ointment as prescribed without much relief.  He says his eyes blurry in the morning, with clear drainage from both eyes.  No fevers, chills.  No eye pain.  No swelling around the eye.  Does not wear contact lenses.   He also endorses right knee pain.  Reviewed x-rays with him and explained that he has osteoarthritis.  PERTINENT  PMH / PSH: Reviewed  OBJECTIVE:   BP 116/77   Pulse 60   Ht '6\' 1"'$  (1.854 m)   Wt 289 lb 3.2 oz (131.2 kg)   SpO2 97%   BMI 38.16 kg/m   General: Well-appearing, no acute distress HEENT: Pupils PERRLA, EOMI.  Clear drainage from both eyes with conjunctival injection bilaterally.  No periorbital swelling.  Small bump over left eyelid without drainage-consistent with chalazion. Respiratory: Normal work of breathing on room air     ASSESSMENT/PLAN:   Chalazion left upper eyelid No concern for bacterial conjunctivitis, periorbital cellulitis.  Likely chalazion given location, not hordeolum. Encouraged warm compresses daily. Also encouraged to massage the area with clean, wash hands. Follow-up in 1 to 2 weeks if no improvement.  Osteoarthritis Encouraged to use Voltaren gel on affected knee up to 4 times daily Discussed nonconservative treatments including corticosteroid injection, gel injections into the knee no improvement with conservative treatment Reviewed x-rays with patient   Orvis Brill, [place]

## 2022-07-22 NOTE — Assessment & Plan Note (Signed)
Encouraged to use Voltaren gel on affected knee up to 4 times daily Discussed nonconservative treatments including corticosteroid injection, gel injections into the knee no improvement with conservative treatment Reviewed x-rays with patient

## 2022-07-22 NOTE — Assessment & Plan Note (Signed)
No concern for bacterial conjunctivitis, periorbital cellulitis.  Likely chalazion given location, not hordeolum. Encouraged warm compresses daily. Also encouraged to massage the area with clean, wash hands. Follow-up in 1 to 2 weeks if no improvement.

## 2022-07-22 NOTE — Patient Instructions (Signed)
It was a pleasure meeting you today  Your eyelid has a "chalazion" which is when an oil gland becomes blocked. To treat this, wash with a mild soap, massage the area with clean hands and use a warm compress. This should get better on its own. If it is not better in 1-2 weeks, please call us.  For your knee pain, use Voltaren gel up to 4 times daily.  Best wishes, Dr. Owens Shark

## 2022-07-31 NOTE — Telephone Encounter (Signed)
Closing the loop--as we discussed, he does not meet cardiac criteria for disability placard (requires NYHA class 3/4 heart failure, which he does not have). Recommend discussing with his PCP.

## 2022-08-04 ENCOUNTER — Encounter (HOSPITAL_BASED_OUTPATIENT_CLINIC_OR_DEPARTMENT_OTHER): Payer: Self-pay

## 2022-09-01 ENCOUNTER — Ambulatory Visit (INDEPENDENT_AMBULATORY_CARE_PROVIDER_SITE_OTHER): Payer: Self-pay

## 2022-09-01 DIAGNOSIS — I480 Paroxysmal atrial fibrillation: Secondary | ICD-10-CM

## 2022-09-02 LAB — CUP PACEART REMOTE DEVICE CHECK
Battery Remaining Longevity: 84 mo
Battery Remaining Percentage: 66 %
Battery Voltage: 2.99 V
Brady Statistic AP VP Percent: 1 %
Brady Statistic AP VS Percent: 1 %
Brady Statistic AS VP Percent: 1 %
Brady Statistic AS VS Percent: 98 %
Brady Statistic RA Percent Paced: 1 %
Brady Statistic RV Percent Paced: 1 %
Date Time Interrogation Session: 20231015052829
Implantable Lead Implant Date: 20190322
Implantable Lead Implant Date: 20190322
Implantable Lead Location: 753859
Implantable Lead Location: 753860
Implantable Pulse Generator Implant Date: 20190322
Lead Channel Impedance Value: 390 Ohm
Lead Channel Impedance Value: 580 Ohm
Lead Channel Pacing Threshold Amplitude: 1 V
Lead Channel Pacing Threshold Amplitude: 1 V
Lead Channel Pacing Threshold Pulse Width: 0.5 ms
Lead Channel Pacing Threshold Pulse Width: 1 ms
Lead Channel Sensing Intrinsic Amplitude: 0.4 mV
Lead Channel Sensing Intrinsic Amplitude: 6.9 mV
Lead Channel Setting Pacing Amplitude: 2.5 V
Lead Channel Setting Pacing Amplitude: 2.5 V
Lead Channel Setting Pacing Pulse Width: 0.5 ms
Lead Channel Setting Sensing Sensitivity: 2.5 mV
Pulse Gen Model: 2272
Pulse Gen Serial Number: 8996724

## 2022-09-10 ENCOUNTER — Other Ambulatory Visit: Payer: Self-pay | Admitting: Family Medicine

## 2022-09-24 NOTE — Progress Notes (Signed)
Remote pacemaker transmission.   

## 2022-10-15 ENCOUNTER — Other Ambulatory Visit: Payer: Self-pay | Admitting: Cardiology

## 2022-10-15 DIAGNOSIS — E7800 Pure hypercholesterolemia, unspecified: Secondary | ICD-10-CM

## 2022-10-15 NOTE — Telephone Encounter (Signed)
Rx request sent to pharmacy.  

## 2022-11-30 ENCOUNTER — Other Ambulatory Visit (HOSPITAL_BASED_OUTPATIENT_CLINIC_OR_DEPARTMENT_OTHER): Payer: Self-pay | Admitting: Cardiology

## 2022-11-30 DIAGNOSIS — I25119 Atherosclerotic heart disease of native coronary artery with unspecified angina pectoris: Secondary | ICD-10-CM

## 2022-11-30 DIAGNOSIS — I10 Essential (primary) hypertension: Secondary | ICD-10-CM

## 2022-12-01 ENCOUNTER — Ambulatory Visit (INDEPENDENT_AMBULATORY_CARE_PROVIDER_SITE_OTHER): Payer: Self-pay

## 2022-12-01 DIAGNOSIS — I480 Paroxysmal atrial fibrillation: Secondary | ICD-10-CM

## 2022-12-01 NOTE — Progress Notes (Signed)
SUBJECTIVE:   CHIEF COMPLAINT / HPI:  Chief Complaint  Patient presents with   Hand Pain    Patient presents today for the evaluation of multiple concerns.  Patient reports that he has had chronic bilateral hand pain that has been ongoing for several years but has been worsening over the past several months.  He has pain primarily in the first and second digits of both hands, worse in the left hand.  He does have some numbness and tingling traveling down to his arms.  He does have to wring his hands at night which does alleviate the numbness and tingling.  Pain has progressed to the point where it is limiting his day-to-day activities and he does endorse some weakness as well.  Denies any significant neck pain.  He has tried a wrist splint in the past and has not found it helpful, though splint is not specifically for carpal tunnel.  He would not want corticosteroid injections for this.  Would be interested in surgery.  Patient also reports that he has had a dry cough for the past 1 to 2 months.  Cough is worse at night.  He endorses postnasal drip.  He has tried Flonase in the past but not taking it consistently.  He reports that his acid reflux is significantly improved since he was started on the pantoprazole.  He still has some breakthrough symptoms on occasion.  He has not had any recent illnesses.  Patient is also requesting a new referral for ophthalmologist for routine eye exams and for persistent chalazion.  He has been seen in clinic for this in the past and was referred to ophthalmology but unfortunately his visit had to be rescheduled after several months of waiting and he had a bad experience with their office staff.  PERTINENT  PMH / PSH: CAD, paroxysmal atrial fibrillation, pacemaker in place, OA, HTN, GERD  Patient Care Team: [name], MD as PCP - General (Family Medicine) [name], MD as PCP - Family Medicine (Family Medicine) Buford Dresser, MD as PCP -  Cardiology (Cardiology) Lavonna Monarch, MD (Inactive) as Consulting Physician (Dermatology)   OBJECTIVE:   BP 124/88   Pulse 63   Ht '6\' 1"'$  (1.854 m)   Wt 294 lb (133.4 kg)   SpO2 98%   BMI 38.79 kg/m   Physical Exam Constitutional:      General: He is not in acute distress. HENT:     Head: Normocephalic and atraumatic.  Eyes:     Comments: Eyelid mass noted on left upper eyelid and right lower eyelid consistent with chalazion  Cardiovascular:     Rate and Rhythm: Normal rate and regular rhythm.  Pulmonary:     Effort: Pulmonary effort is normal. No respiratory distress.     Breath sounds: Normal breath sounds.  Musculoskeletal:     Comments: No obvious deformities.  He does have some mild tenderness to palpation to the MCP and DIP/PIP/interphalangeal joint of the first and second digits of bilateral hands.  Neurological:     Mental Status: He is alert.     Comments: 5/5 grip strength bilaterally         12/03/2022    8:30 AM  Depression screen PHQ 2/9  Decreased Interest 0  Down, Depressed, Hopeless 0  PHQ - 2 Score 0  Altered sleeping 1  Tired, decreased energy 1  Change in appetite 1  Feeling bad or failure about yourself  0  Trouble concentrating 0  Moving  slowly or fidgety/restless 0  Suicidal thoughts 0  PHQ-9 Score 3     {Show previous vital signs (optional):23777}    ASSESSMENT/PLAN:   Chalazion of both eyes Sending new referral to ophthalmology per patient request.  Bilateral hand pain Chronic bilateral hand pain with associated numbness and tingling.  Suspect he does have element of carpal tunnel syndrome but may also have OA as well based on history and exam.  Positive flick sign.  Not interested in corticosteroid injections. - referral to neurology, may benefit from NCS and ultimately surgery - XR hand and wrist bilateral  Chronic cough Cough ongoing for 1 to 2 weeks.  Most likely secondary to postnasal drip, there could be some component of  breakthrough GERD contributing as well. - advised to use Flonase consistently - continue PPI - recommended humidifier - reasonable to obtain CXR given duration, discussed with patient and will consider at follow-up if not improving    Return in about 3 months (around 03/04/2023) for f/u cough, hand pain.   [name], MD [place]

## 2022-12-01 NOTE — Telephone Encounter (Signed)
Rx request sent to pharmacy.  

## 2022-12-01 NOTE — Patient Instructions (Addendum)
It was nice seeing you today!  I have placed referral to neurologist and ophthalmologist.  Try a humidifier at night. Try to use the Flonase more consistently.  Try a cock-up wrist splint or a splint specific for carpal tunnel.  [facility] Address: [address], [place], [place] [address] Phone: [phone]   Stay well, [name], MD [place] [phone]  --  Make sure to check out at the front desk before you leave today.  Please arrive at least 15 minutes prior to your scheduled appointments.  If you had blood work today, I will send you a MyChart message or a letter if results are normal. Otherwise, I will give you a call.  If you had a referral placed, they will call you to set up an appointment. Please give [place] a call if you don't hear back in the next 2 weeks.  If you need additional refills before your next appointment, please call your pharmacy first.

## 2022-12-03 ENCOUNTER — Encounter: Payer: Self-pay | Admitting: Family Medicine

## 2022-12-03 ENCOUNTER — Ambulatory Visit (INDEPENDENT_AMBULATORY_CARE_PROVIDER_SITE_OTHER): Payer: Medicare HMO | Admitting: Family Medicine

## 2022-12-03 ENCOUNTER — Ambulatory Visit
Admission: RE | Admit: 2022-12-03 | Discharge: 2022-12-03 | Disposition: A | Discharge status: Home / Self Care | Payer: Medicare HMO | Source: Ambulatory Visit | Attending: Family Medicine | Admitting: Family Medicine

## 2022-12-03 VITALS — BP 124/88 | HR 63 | Ht 73.0 in | Wt 294.0 lb

## 2022-12-03 DIAGNOSIS — M79641 Pain in right hand: Secondary | ICD-10-CM | POA: Insufficient documentation

## 2022-12-03 DIAGNOSIS — H0013 Chalazion right eye, unspecified eyelid: Secondary | ICD-10-CM

## 2022-12-03 DIAGNOSIS — G8929 Other chronic pain: Secondary | ICD-10-CM

## 2022-12-03 DIAGNOSIS — Z23 Encounter for immunization: Secondary | ICD-10-CM | POA: Diagnosis not present

## 2022-12-03 DIAGNOSIS — R053 Chronic cough: Secondary | ICD-10-CM | POA: Insufficient documentation

## 2022-12-03 DIAGNOSIS — M79642 Pain in left hand: Secondary | ICD-10-CM

## 2022-12-03 DIAGNOSIS — H0016 Chalazion left eye, unspecified eyelid: Secondary | ICD-10-CM

## 2022-12-03 LAB — CUP PACEART REMOTE DEVICE CHECK
Battery Remaining Longevity: 81 mo
Battery Remaining Percentage: 64 %
Battery Voltage: 2.99 V
Brady Statistic AP VP Percent: 1 %
Brady Statistic AP VS Percent: 1 %
Brady Statistic AS VP Percent: 1 %
Brady Statistic AS VS Percent: 98 %
Brady Statistic RA Percent Paced: 1 %
Brady Statistic RV Percent Paced: 1 %
Date Time Interrogation Session: 20240114040031
Implantable Lead Connection Status: 753985
Implantable Lead Connection Status: 753985
Implantable Lead Implant Date: 20190322
Implantable Lead Implant Date: 20190322
Implantable Lead Location: 753859
Implantable Lead Location: 753860
Implantable Pulse Generator Implant Date: 20190322
Lead Channel Impedance Value: 380 Ohm
Lead Channel Impedance Value: 560 Ohm
Lead Channel Pacing Threshold Amplitude: 1 V
Lead Channel Pacing Threshold Amplitude: 1 V
Lead Channel Pacing Threshold Pulse Width: 0.5 ms
Lead Channel Pacing Threshold Pulse Width: 1 ms
Lead Channel Sensing Intrinsic Amplitude: 0.5 mV
Lead Channel Sensing Intrinsic Amplitude: 6.3 mV
Lead Channel Setting Pacing Amplitude: 2.5 V
Lead Channel Setting Pacing Amplitude: 2.5 V
Lead Channel Setting Pacing Pulse Width: 0.5 ms
Lead Channel Setting Sensing Sensitivity: 2.5 mV
Pulse Gen Model: 2272
Pulse Gen Serial Number: 8996724

## 2022-12-03 NOTE — Assessment & Plan Note (Signed)
Cough ongoing for 1 to 2 weeks.  Most likely secondary to postnasal drip, there could be some component of breakthrough GERD contributing as well. - advised to use Flonase consistently - continue PPI - recommended humidifier - reasonable to obtain CXR given duration, discussed with patient and will consider at follow-up if not improving

## 2022-12-03 NOTE — Assessment & Plan Note (Signed)
Chronic bilateral hand pain with associated numbness and tingling.  Suspect he does have element of carpal tunnel syndrome but may also have OA as well based on history and exam.  Positive flick sign.  Not interested in corticosteroid injections. - referral to neurology, may benefit from NCS and ultimately surgery - XR hand and wrist bilateral

## 2022-12-03 NOTE — Assessment & Plan Note (Signed)
Sending new referral to ophthalmology per patient request.

## 2022-12-04 NOTE — Addendum Note (Signed)
Addended by: [name] D on: 12/04/2022 06:46 PM   Modules accepted: Orders

## 2022-12-04 NOTE — Progress Notes (Signed)
Ivar Drape with [facility] has reviewed the office notes and referral request, advised changing referral to orthopedics referral rather than neurology as nerve conduction studies can be done at an orthopedic office and will therefore not need an extra referral if surgery is needed.  I have canceled the neurology referral and have placed orthopedic referral instead.

## 2022-12-12 ENCOUNTER — Encounter: Payer: Self-pay | Admitting: Family Medicine

## 2022-12-19 ENCOUNTER — Ambulatory Visit (HOSPITAL_BASED_OUTPATIENT_CLINIC_OR_DEPARTMENT_OTHER): Payer: Medicare HMO | Admitting: Cardiology

## 2022-12-19 ENCOUNTER — Encounter (HOSPITAL_BASED_OUTPATIENT_CLINIC_OR_DEPARTMENT_OTHER): Payer: Self-pay | Admitting: Cardiology

## 2022-12-19 VITALS — BP 126/82 | HR 71 | Ht 73.0 in | Wt 293.0 lb

## 2022-12-19 DIAGNOSIS — Z950 Presence of cardiac pacemaker: Secondary | ICD-10-CM

## 2022-12-19 DIAGNOSIS — D6869 Other thrombophilia: Secondary | ICD-10-CM

## 2022-12-19 DIAGNOSIS — Z951 Presence of aortocoronary bypass graft: Secondary | ICD-10-CM

## 2022-12-19 DIAGNOSIS — I480 Paroxysmal atrial fibrillation: Secondary | ICD-10-CM | POA: Diagnosis not present

## 2022-12-19 DIAGNOSIS — E7800 Pure hypercholesterolemia, unspecified: Secondary | ICD-10-CM

## 2022-12-19 DIAGNOSIS — I2510 Atherosclerotic heart disease of native coronary artery without angina pectoris: Secondary | ICD-10-CM

## 2022-12-19 DIAGNOSIS — I10 Essential (primary) hypertension: Secondary | ICD-10-CM

## 2022-12-19 NOTE — Progress Notes (Signed)
Cardiology Office Note:    Date:  12/19/2022   ID:  Ryan Clarke, DOB Apr 24, 1956, MRN [id number]  PCP:  [name], MD  Cardiologist:  Buford Dresser, MD  EP: Dr. Caryl Comes  CC: follow up  History of Present Illness:    Ryan Clarke is a 67 y.o. male with a hx of CAD with many stents remotely, now s/p CABGx2 11/2020, pacemaker who is seen for close follow up. I initially met him 08/14/20 as a referral for palpitations, but on discussion discovered extensive CAD history and angina.  Cardiac history: Had all of his prior cardiac care as part of [facility] in [place]. Not available in Care Everywhere. Reports history of 29 stents, last stent 06/2019. Moved to [place] in 05/2020. S/P CABGx2 by Dr. Cyndia Bent on 0000000, complicated by post op atrial fibrillation. Due to recurrence of paroxysmal atrial fib, continued on anticoagulation.  At his last appointment he was doing well overall. Still dealing with GERD, with occasional burning after eating, cough when lying down. No exertional chest pain. Had a brief time when he smelled alcohol type smell with eliquis, resolved. Had one brief episode of palpitations one month prior, remote check was normal. Reviewed labs. LDL at goal at 55, prior was 35, but his diet had relaxed somewhat.  Today, he is accompanied by his wife. He is feeling a little lightheaded and dizzy which he attributes to sinus issues.  Every now and then he experiences "tweaks" which he also describes as a heart jump or skip. These palpitations have occurred throughout his life. Typically they are brief, maybe 1-2 minutes, and then don't recur for a while. He may be walking the dog or sitting down when they randomly occur.  Lately he has noticed an increase in his blood pressures. Previously it was averaging 105-114, but is now more frequently in the [id number] systolic at home. Of note, he is in constant pain (8/10 severity) in his bilateral hands. Currently he is following with orthopedics.  They are considering cortisone injections vs. carpal tunnel surgery.  Also, he is concerned about having blood clots in his legs. Lately he has had significant leg pain, and tenderness in his frontal knees bilaterally. 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   He has not been able to procure his Eliquis as it is cost prohibitive. He states he sent in patient assistance forms but has not yet heard back. He remains compliant with prasugrel. No bleeding issues.  He denies any chest pain, shortness of breath, or peripheral edema. No headaches, syncope, orthopnea, or PND.   Past Medical History:  Diagnosis Date   Carpal tunnel syndrome 09/10/2020   Coronary artery disease    Hypertension    Nodular basal cell carcinoma (BCC) 09/04/2021   Left Lower Back    Past Surgical History:  Procedure Laterality Date   CORONARY ARTERY BYPASS GRAFT N/A 11/26/2020   Procedure: CORONARY ARTERY BYPASS GRAFTING (CABG) TIMES TWO USING BILATERAL INTERNAL MAMMARY ARTERIES;  Surgeon: Gaye Pollack, MD;  Location: [facility];  Service: Open Heart Surgery;  Laterality: N/A;  BIMA   CORONARY BALLOON ANGIOPLASTY N/A 09/14/2020   Procedure: CORONARY BALLOON ANGIOPLASTY;  Surgeon: Troy Sine, MD;  Location: [facility];  Service: Cardiovascular;  Laterality: N/A;   CORONARY STENT PLACEMENT     LEFT HEART CATH AND CORONARY ANGIOGRAPHY N/A 09/14/2020   Procedure: LEFT HEART CATH AND CORONARY ANGIOGRAPHY;  Surgeon: Troy Sine, MD;  Location: [facility];  Service: Cardiovascular;  Laterality: N/A;   LEFT HEART CATH AND CORONARY ANGIOGRAPHY N/A 11/19/2020   Procedure: LEFT HEART CATH AND CORONARY ANGIOGRAPHY;  Surgeon: Troy Sine, MD;  Location: [facility];  Service: Cardiovascular;  Laterality: N/A;   PACEMAKER INSERTION     TEE WITHOUT CARDIOVERSION N/A 11/26/2020   Procedure: TRANSESOPHAGEAL ECHOCARDIOGRAM (TEE);  Surgeon: Gaye Pollack,  MD;  Location: [facility];  Service: Open Heart Surgery;  Laterality: N/A;    Current Medications: Current Outpatient Medications on File Prior to Visit  Medication Sig   atorvastatin (LIPITOR) 80 MG tablet TAKE 1 TABLET BY MOUTH EVERYDAY AT BEDTIME   chlorthalidone (HYGROTON) 25 MG tablet TAKE 1 TABLET (25 MG TOTAL) BY MOUTH DAILY.   metoprolol tartrate (LOPRESSOR) 25 MG tablet TAKE 1 TABLET BY MOUTH TWICE A DAY   pantoprazole (PROTONIX) 40 MG tablet TAKE 1 TABLET BY MOUTH EVERY DAY   prasugrel (EFFIENT) 10 MG TABS tablet TAKE 1 TABLET BY MOUTH EVERY DAY   No current facility-administered medications on file prior to visit.     Allergies:   Patient has no known allergies.   Social History   Tobacco Use   Smoking status: Never   Smokeless tobacco: Never  Substance Use Topics   Alcohol use: Never   Drug use: Never    Family History: No premature history of CAD  ROS:   Please see the history of present illness.   (+) Lightheadedness, dizziness (+) BLE pain (+) Palpitations (+) Bilateral hand pain Additional pertinent ROS otherwise unremarkable   EKGs/Labs/Other Studies Reviewed:    The following studies were reviewed today:  TEE 11/26/2020: POST-OP IMPRESSIONS  - Left Ventricle: The left ventricle is unchanged from pre-bypass.  - Right Ventricle: The right ventricle appears unchanged from pre-bypass.  - Aorta: The aorta appears unchanged from pre-bypass.  - Left Atrium: The left atrium appears unchanged from pre-bypass.  - Left Atrial Appendage: The left atrial appendage appears unchanged from  pre-bypass.  - Aortic Valve: The aortic valve appears unchanged from pre-bypass.  - Mitral Valve: The mitral valve appears unchanged from pre-bypass.  - Tricuspid Valve: The tricuspid valve appears unchanged from pre-bypass.  - Interatrial Septum: The interatrial septum appears unchanged from  pre-bypass.  - Interventricular Septum: The interventricular septum appears unchanged   from  pre-bypass.  - Pericardium: The pericardium appears unchanged from pre-bypass.  - Comments: post bypass EF similar to pre surgery  50-55%  No RWMA.   Pre CABG Carotid Doppler 11/20/2020: Summary:  Right Carotid: Velocities in the right ICA are consistent with a 1-39%  stenosis.   Left Carotid: Velocities in the left ICA are consistent with a 1-39%  stenosis.   Right ABI: Resting right ankle-brachial index is within normal range. No  evidence of significant right lower extremity arterial disease.  Left ABI: Resting left ankle-brachial index is within normal range. No  evidence of significant left lower extremity arterial disease.  Right Upper Extremity: Doppler waveforms remain within normal limits with  right radial compression. Doppler waveforms remain within normal limits  with right ulnar compression.  Left Upper Extremity: Doppler waveforms remain within normal limits with  left radial compression. Doppler waveforms remain within normal limits  with left ulnar compression.   Echo 11/19/2020: 1. Left ventricular ejection fraction, by estimation, is 55 to 60%. The  left ventricle has normal function. The left ventricle has no regional  wall motion abnormalities. There is mild  concentric left ventricular  hypertrophy. Left ventricular diastolic  parameters are consistent with Grade I diastolic dysfunction (impaired  relaxation).   2. Right ventricular systolic function is normal. The right ventricular  size is normal.   3. The mitral valve is normal in structure. No evidence of mitral valve  regurgitation. No evidence of mitral stenosis.   4. The aortic valve is tricuspid. Aortic valve regurgitation is not  visualized. No aortic stenosis is present.   5. Aortic dilatation noted. There is mild dilatation at the level of the  sinuses of Valsalva, measuring 39 mm.   6. The inferior vena cava is normal in size with greater than 50%  respiratory variability, suggesting right  atrial pressure of 3 mmHg.   LHC 11/19/2020: 1st Mrg lesion is 40% stenosed. Prox Cx to Mid Cx lesion is 80% stenosed. Non-stenotic RPAV lesion was previously treated. Ost RCA to Dist RCA lesion is 5% stenosed. Dist LM to Ost LAD lesion is 80% stenosed. Mid LAD lesion is 60% stenosed with 60% stenosed side branch in 1st Sept. Previously placed 2nd Diag stent (unknown type) is widely patent.   Multivessel CAD with stents in the LAD, diagonal, circumflex, circumflex marginal, and RCA extending to the PLA takeoff.   The LAD has progressive 80% ostial stenosis which seems to occur immediately proximal to the proximal stent.  There is diffuse 60% narrowing in the proximal and mid stented segment.  The diagonal stent is patent.  The distal portion of the mid LAD stent is patent.   Left circumflex stent extending from the ostium to the mid AV groove circumflex with focal eccentric 80% restenosis in the mid circumflex immediately after the takeoff of the marginal vessel.  There is 30 to 40% ostial narrowing in th marginal vessel which is stented ostially to proximally.   The RCA is diffusely stented from the ostium to the takeoff of the PLA vessel.  There is mild luminal irregularity.   Preserved global LV contractility with EF estimate at 18 mmHg.   RECOMMENDATION: The angiographic findings were reviewed with Dr. Gwenlyn Found in the catheterization laboratory.  With the ostial progression of 80% in the LAD as well as again restenosis in the circumflex vessel recommend surgical consultation for optimal long-term benefit.  We will hold prasugrel.  Increase anti-ischemic medications, optimal lipid management and BP control.   Cath 09/14/20 Previously placed RPAV stent (unknown type) is widely patent. Ost RCA to Dist RCA lesion is 5% stenosed. Previously placed Prox LAD to Dist LAD stent (unknown type) is widely patent. Ost LAD lesion is 50% stenosed. Prox Cx to Mid Cx lesion is 99% stenosed. 1st Mrg  lesion is 30% stenosed. Post intervention, there is a 30% residual stenosis. Post intervention, there is a 20% residual stenosis. The left ventricular systolic function is normal. LV end diastolic pressure is normal.   Multivessel diffuse stenting with multiple stents inserted from the ostium of the LAD to the distal third of the LAD with focal 50% ostial narrowing; stenting of the proximal to mid circumflex vessel with bifurcation stenting of the OM vessel with 99% stenosis with thrombus in the circumflex stent immediately after the takeoff of the stented OM vessel with TIMI 0-I flow down the distal circumflex; and diffuse stenting of the entire dominant RCA extending from the ostium to the PDA without significant restenosis.   Preserved global LV function   Very difficult intervention with initial plaque shifting into the OM vessel requiring ultimate PTCA of both the  OM ostium as well as the circumflex stent with Cutting Balloon intervention to the circumflex stent due to persistent intimal hyperplasia not resolved with noncompliant balloon dilatation.  Ultimate kissing balloon technique to the OM and circumflex with a residual narrowing in the OM at 20% and in the circumflex at  30% with resultant brisk TIMI-3 flow to the distal circumflex vessel supplying several additional vessels.   RECOMMENDATION: Long-term DAPT indefinitely due to the extensive stenting in this patient who previously had 29 stents placed in all of his major coronary arteries.  Aggressive lipid-lowering therapy with target LDL in the 50s or below with optimal blood pressure control.   EKG:   EKG is personally reviewed. 12/19/2022:  sinus rhythm with 1st degree AV block at 71 bpm, RBBB, PVC, prior inferior infarct 09/30/2021: sinus bradycardia at 59 bpm, 1st degree AV block, RBBB 04/03/2021: sinus bradycardia at 51 bpm, 1st degree AV block, RBBB, prior inferior infarct 10/01/2020: sinus bradycardia/sinus arrhythma, RBBB, 1st  degree AV block, prior inferior infarct  Recent Labs: 05/27/2022: BUN 13; Creatinine, Ser 1.04; Potassium 3.6; Sodium 145   Recent Lipid Panel    Component Value Date/Time   CHOL 115 05/27/2022 1011   TRIG 116 05/27/2022 1011   HDL 39 (L) 05/27/2022 1011   CHOLHDL 2.9 05/27/2022 1011   LDLCALC 55 05/27/2022 1011   LDLDIRECT 57 04/26/2021 1100    Physical Exam:    VS:  BP 126/82   Pulse 71   Ht '6\' 1"'$  (1.854 m)   Wt 293 lb (132.9 kg)   BMI 38.66 kg/m     Wt Readings from Last 3 Encounters:  12/19/22 293 lb (132.9 kg)  12/03/22 294 lb (133.4 kg)  07/22/22 289 lb 3.2 oz (131.2 kg)    GEN: Well nourished, well developed in no acute distress HEENT: Normal, moist mucous membranes NECK: No JVD CARDIAC: regular rhythm, normal S1 and S2, no rubs or gallops. No murmur. VASCULAR: Radial and DP pulses 2+ bilaterally. No carotid bruits RESPIRATORY:  Clear to auscultation without rales, wheezing or rhonchi  ABDOMEN: Soft, non-tender, non-distended MUSCULOSKELETAL:  Ambulates independently SKIN: Warm and dry, no edema NEUROLOGIC:  Alert and oriented x 3. No focal neuro deficits noted. PSYCHIATRIC:  Normal affect    ASSESSMENT:    1. Paroxysmal atrial fibrillation (HCC)   2. Coronary artery disease involving native coronary artery of native heart without angina pectoris   3. S/P CABG x 2   4. Essential hypertension   5. Pure hypercholesterolemia   6. Secondary hypercoagulable state ([place])   7. Cardiac pacemaker in situ     PLAN:    Coronary artery disease, with reported 29 prior stents in [place] (no records), now s/p 2V CABG 11/2020 -no recent angina -instructed on red flag warning signs that need immediate medical attention -continue prasugrel + apixaban vs. Change to clopidogrel in the future. We discussed, doing well on current regimen, continue prasugrel at this time -continue atorvastatin -continue metoprolol. Consider imdur, amlodipine if additional anti-anginals  needed  S/P Dual chamber [employer] pacemaker -established with device clinic  Paroxysmal atrial fibrillation:  -sinus by exam today -CHA2DS2/VAS Stroke Risk Points=3 -now on prasugrel and apixaban. Difficult to afford apixaban, waiting on patient assistance. Consider generic pradaxa if this can be located  Hypertension: goal <130/80 -continue chlorthalidone -Had prior leg swelling on amlodipine. -continue metoprolol  Hyperlipidemia: -LDL goal <70, LDL 55 in 2023 per [facility] -continue atorvastatin  Cardiac risk counseling and prevention recommendations: -recommend heart  healthy/Mediterranean diet, with whole grains, fruits, vegetable, fish, lean meats, nuts, and olive oil. Limit salt. -recommend moderate walking, 3-5 times/week for 30-50 minutes each session. Aim for at least 150 minutes.week. Goal should be pace of 3 miles/hours, or walking 1.5 miles in 30 minutes -recommend avoidance of tobacco products. Avoid excess alcohol.  Plan for follow up: 5 months or sooner as needed.  Buford Dresser, MD, PhD [place]  [facility]    Medication Adjustments/Labs and Tests Ordered: Current medicines are reviewed at length with the patient today.  Concerns regarding medicines are outlined above.   Orders Placed This Encounter  Procedures   EKG 12-Lead   No orders of the defined types were placed in this encounter.  Patient Instructions  Medication Instructions:  Your Physician recommend you continue on your current medication as directed.    *If you need a refill on your cardiac medications before your next appointment, please call your pharmacy*  Follow-Up: At [facility], you and your health needs are our priority.  As part of our continuing mission to provide you with exceptional heart care, we have created designated Provider Care Teams.  These Care Teams include your primary Cardiologist (physician) and Advanced Practice Providers (APPs -  Physician Assistants  and Nurse Practitioners) who all work together to provide you with the care you need, when you need it.  We recommend signing up for the patient portal called "MyChart".  Sign up information is provided on this After Visit Summary.  MyChart is used to connect with patients for Virtual Visits (Telemedicine).  Patients are able to view lab/test results, encounter notes, upcoming appointments, etc.  Non-urgent messages can be sent to your provider as well.   To learn more about what you can do with MyChart, go to [email or web address].    Your next appointment:   5 month(s)  Provider:   Buford Dresser, MD      [facility],acting as a scribe for Buford Dresser, MD.,have documented all relevant documentation on the behalf of Buford Dresser, MD,as directed by  Buford Dresser, MD while in the presence of Buford Dresser, MD.  I, Buford Dresser, MD, have reviewed all documentation for this visit. The documentation on 01/21/23 for the exam, diagnosis, procedures, and orders are all accurate and complete.   Signed, Buford Dresser, MD PhD 12/19/2022   [place]

## 2022-12-19 NOTE — Patient Instructions (Signed)
Medication Instructions:  Your Physician recommend you continue on your current medication as directed.    *If you need a refill on your cardiac medications before your next appointment, please call your pharmacy*  Follow-Up: At [facility], you and your health needs are our priority.  As part of our continuing mission to provide you with exceptional heart care, we have created designated Provider Care Teams.  These Care Teams include your primary Cardiologist (physician) and Advanced Practice Providers (APPs -  Physician Assistants and Nurse Practitioners) who all work together to provide you with the care you need, when you need it.  We recommend signing up for the patient portal called "MyChart".  Sign up information is provided on this After Visit Summary.  MyChart is used to connect with patients for Virtual Visits (Telemedicine).  Patients are able to view lab/test results, encounter notes, upcoming appointments, etc.  Non-urgent messages can be sent to your provider as well.   To learn more about what you can do with MyChart, go to [email or web address].    Your next appointment:   5 month(s)  Provider:   Buford Dresser, MD

## 2022-12-29 ENCOUNTER — Encounter (HOSPITAL_BASED_OUTPATIENT_CLINIC_OR_DEPARTMENT_OTHER): Payer: Self-pay

## 2022-12-30 NOTE — Telephone Encounter (Signed)
Just an FYI

## 2023-01-16 NOTE — Progress Notes (Signed)
Remote pacemaker transmission.   

## 2023-03-02 ENCOUNTER — Ambulatory Visit (INDEPENDENT_AMBULATORY_CARE_PROVIDER_SITE_OTHER): Payer: Medicare HMO

## 2023-03-02 DIAGNOSIS — I480 Paroxysmal atrial fibrillation: Secondary | ICD-10-CM

## 2023-03-02 LAB — CUP PACEART REMOTE DEVICE CHECK
Battery Remaining Longevity: 79 mo
Battery Remaining Percentage: 62 %
Battery Voltage: 2.98 V
Brady Statistic AP VP Percent: 1 %
Brady Statistic AP VS Percent: 1 %
Brady Statistic AS VP Percent: 1 %
Brady Statistic AS VS Percent: 98 %
Brady Statistic RA Percent Paced: 1 %
Brady Statistic RV Percent Paced: 1 %
Date Time Interrogation Session: 20240415024914
Implantable Lead Connection Status: 753985
Implantable Lead Connection Status: 753985
Implantable Lead Implant Date: 20190322
Implantable Lead Implant Date: 20190322
Implantable Lead Location: 753859
Implantable Lead Location: 753860
Implantable Pulse Generator Implant Date: 20190322
Lead Channel Impedance Value: 390 Ohm
Lead Channel Impedance Value: 510 Ohm
Lead Channel Pacing Threshold Amplitude: 1 V
Lead Channel Pacing Threshold Amplitude: 1 V
Lead Channel Pacing Threshold Pulse Width: 0.5 ms
Lead Channel Pacing Threshold Pulse Width: 1 ms
Lead Channel Sensing Intrinsic Amplitude: 0.7 mV
Lead Channel Sensing Intrinsic Amplitude: 6.6 mV
Lead Channel Setting Pacing Amplitude: 2.5 V
Lead Channel Setting Pacing Amplitude: 2.5 V
Lead Channel Setting Pacing Pulse Width: 0.5 ms
Lead Channel Setting Sensing Sensitivity: 2.5 mV
Pulse Gen Model: 2272
Pulse Gen Serial Number: 8996724

## 2023-03-11 ENCOUNTER — Other Ambulatory Visit: Payer: Self-pay | Admitting: Family Medicine

## 2023-03-25 ENCOUNTER — Telehealth: Payer: Self-pay | Admitting: Family Medicine

## 2023-03-25 NOTE — Telephone Encounter (Signed)
Called patient to schedule Medicare Annual Wellness Visit (AWV). Left message for patient to call back and schedule Medicare Annual Wellness Visit (AWV).  Last date of AWV: AWVI  eligible as of 11/17/2021  Please schedule an AWVI appointment at any time with [facility] VISIT.  If any questions, please contact me at [phone].    Thank you,  [facility] Direct dial  [phone]

## 2023-04-07 NOTE — Progress Notes (Signed)
Remote pacemaker transmission.   

## 2023-04-17 ENCOUNTER — Other Ambulatory Visit: Payer: Self-pay | Admitting: Cardiology

## 2023-04-17 ENCOUNTER — Ambulatory Visit (INDEPENDENT_AMBULATORY_CARE_PROVIDER_SITE_OTHER): Payer: Medicare HMO | Admitting: Family Medicine

## 2023-04-17 VITALS — BP 118/60 | HR 60 | Ht 73.0 in | Wt 282.0 lb

## 2023-04-17 DIAGNOSIS — Z0000 Encounter for general adult medical examination without abnormal findings: Secondary | ICD-10-CM | POA: Diagnosis not present

## 2023-04-17 DIAGNOSIS — B9689 Other specified bacterial agents as the cause of diseases classified elsewhere: Secondary | ICD-10-CM

## 2023-04-17 DIAGNOSIS — I10 Essential (primary) hypertension: Secondary | ICD-10-CM | POA: Diagnosis not present

## 2023-04-17 DIAGNOSIS — J329 Chronic sinusitis, unspecified: Secondary | ICD-10-CM | POA: Diagnosis not present

## 2023-04-17 DIAGNOSIS — I2510 Atherosclerotic heart disease of native coronary artery without angina pectoris: Secondary | ICD-10-CM | POA: Diagnosis not present

## 2023-04-17 DIAGNOSIS — E7800 Pure hypercholesterolemia, unspecified: Secondary | ICD-10-CM

## 2023-04-17 MED ORDER — AMOXICILLIN-POT CLAVULANATE 875-125 MG PO TABS: 1.0000 | ORAL_TABLET | Freq: Two times a day (BID) | ORAL | 0 refills | Status: AC

## 2023-04-17 NOTE — Telephone Encounter (Signed)
Rx request sent to pharmacy.  

## 2023-04-17 NOTE — Patient Instructions (Addendum)
It was nice seeing you today!  Think about getting the shingles vaccine at your drugstore.  Take antibiotics as prescribed.  You can try Flonase.  Stay well, Littie Deeds, MD [facility] [phone]  --  Make sure to check out at the front desk before you leave today.  Please arrive at least 15 minutes prior to your scheduled appointments.  If you had blood work today, I will send you a MyChart message or a letter if results are normal. Otherwise, I will give you a call.  If you had a referral placed, they will call you to set up an appointment. Please give [place] a call if you don't hear back in the next 2 weeks.  If you need additional refills before your next appointment, please call your pharmacy first.

## 2023-04-17 NOTE — Progress Notes (Signed)
SUBJECTIVE:   CHIEF COMPLAINT / HPI:  Chief Complaint  Patient presents with   Annual Exam   sinus congestion    Patient is here today for routine annual physical exam.  Concerns today include ongoing sinus congestion.  He reports he was ill for the past month but is still dealing with ongoing sinus pressure/headaches and cough.  Denies fever.  Causing him some difficulty sleeping at night.  He has recently seen his cardiologist and the orthopedist.  He reports he was given a splint for his carpal tunnel and his symptoms resolved after a week.  He recently had some injections of his knee which helped significantly.  He reports exercising daily with walking.  300 minutes/week.  PERTINENT  PMH / PSH: Atrial fibrillation, CAD, GERD  Patient Care Team: Littie Deeds, MD as PCP - General (Family Medicine) Littie Deeds, MD as PCP - Family Medicine (Family Medicine) Jodelle Red, MD as PCP - Cardiology (Cardiology) Janalyn Harder, MD (Inactive) as Consulting Physician (Dermatology)   OBJECTIVE:   BP 118/60   Pulse 60   Ht 6\' 1"  (1.854 m)   Wt 282 lb (127.9 kg)   SpO2 95%   BMI 37.21 kg/m   Physical Exam Constitutional:      General: He is not in acute distress. HENT:     Head: Normocephalic and atraumatic.     Nose: Congestion present.     Mouth/Throat:     Mouth: Mucous membranes are moist.     Pharynx: Oropharynx is clear.  Eyes:     General:        Right eye: No discharge.        Left eye: No discharge.     Extraocular Movements: Extraocular movements intact.  Cardiovascular:     Rate and Rhythm: Normal rate and regular rhythm.  Pulmonary:     Effort: Pulmonary effort is normal. No respiratory distress.     Breath sounds: Normal breath sounds.  Musculoskeletal:     Cervical back: Neck supple.  Lymphadenopathy:     Cervical: No cervical adenopathy.  Neurological:     Mental Status: He is alert.         04/17/2023    8:45 AM  Depression screen  PHQ 2/9  Decreased Interest 0  Down, Depressed, Hopeless 0  PHQ - 2 Score 0  Altered sleeping 1  Tired, decreased energy 1  Change in appetite 1  Feeling bad or failure about yourself  0  Trouble concentrating 0  Moving slowly or fidgety/restless 0  Suicidal thoughts 0  PHQ-9 Score 3     {Show previous vital signs (optional):23777}    ASSESSMENT/PLAN:   Problem List Items Addressed This Visit       Cardiovascular and Mediastinum   CAD (coronary artery disease)   Relevant Orders   CBC   Lipid Panel   Hypertension   Relevant Orders   Basic Metabolic Panel   Other Visit Diagnoses     Encounter for routine history and physical examination of adult    -  Primary   Bacterial sinusitis       Relevant Medications   amoxicillin-clavulanate (AUGMENTIN) 875-125 MG tablet       Given ongoing sinus congestion for about 1 month, will treat him for bacterial sinusitis.  Advised that he can try Flonase for symptoms as well.  HCM - recommended shingles vaccine, he will consider - reports he had Tdap in [place] prior to moving here a few  years ago  Return in about 1 year (around 04/16/2024) for physical.   Littie Deeds, MD [facility]

## 2023-04-18 LAB — CBC
Hematocrit: 51.8 % — ABNORMAL HIGH (ref 37.5–51.0)
Hemoglobin: 17.4 g/dL (ref 13.0–17.7)
MCH: 30.1 pg (ref 26.6–33.0)
MCHC: 33.6 g/dL (ref 31.5–35.7)
MCV: 90 fL (ref 79–97)
Platelets: 219 10*3/uL (ref 150–450)
RBC: 5.79 x10E6/uL (ref 4.14–5.80)
RDW: 12.8 % (ref 11.6–15.4)
WBC: 6.3 10*3/uL (ref 3.4–10.8)

## 2023-04-18 LAB — BASIC METABOLIC PANEL
BUN/Creatinine Ratio: 13 (ref 10–24)
BUN: 14 mg/dL (ref 8–27)
CO2: 29 mmol/L (ref 20–29)
Calcium: 9.7 mg/dL (ref 8.6–10.2)
Chloride: 98 mmol/L (ref 96–106)
Creatinine, Ser: 1.06 mg/dL (ref 0.76–1.27)
Glucose: 99 mg/dL (ref 70–99)
Potassium: 3.4 mmol/L — ABNORMAL LOW (ref 3.5–5.2)
Sodium: 142 mmol/L (ref 134–144)
eGFR: 77 mL/min/{1.73_m2} (ref >59)

## 2023-04-18 LAB — LIPID PANEL
Chol/HDL Ratio: 3.1 ratio (ref 0.0–5.0)
Cholesterol, Total: 102 mg/dL (ref 100–199)
HDL: 33 mg/dL — ABNORMAL LOW (ref >39)
LDL Chol Calc (NIH): 42 mg/dL (ref 0–99)
Triglycerides: 161 mg/dL — ABNORMAL HIGH (ref 0–149)
VLDL Cholesterol Cal: 27 mg/dL (ref 5–40)

## 2023-04-27 ENCOUNTER — Ambulatory Visit (INDEPENDENT_AMBULATORY_CARE_PROVIDER_SITE_OTHER): Payer: Medicare HMO | Admitting: Family Medicine

## 2023-04-27 VITALS — BP 122/79 | HR 78 | Temp 99.3°F | Ht 73.0 in | Wt 280.6 lb

## 2023-04-27 DIAGNOSIS — R112 Nausea with vomiting, unspecified: Secondary | ICD-10-CM | POA: Diagnosis not present

## 2023-04-27 DIAGNOSIS — R300 Dysuria: Secondary | ICD-10-CM

## 2023-04-27 DIAGNOSIS — N12 Tubulo-interstitial nephritis, not specified as acute or chronic: Secondary | ICD-10-CM | POA: Diagnosis not present

## 2023-04-27 LAB — POCT URINALYSIS DIP (MANUAL ENTRY)
Bilirubin, UA: NEGATIVE
Glucose, UA: NEGATIVE mg/dL
Ketones, POC UA: NEGATIVE mg/dL
Nitrite, UA: POSITIVE — AB
Protein Ur, POC: 30 mg/dL — AB
Spec Grav, UA: 1.015 (ref 1.010–1.025)
Urobilinogen, UA: 1 E.U./dL
pH, UA: 7 (ref 5.0–8.0)

## 2023-04-27 LAB — POCT UA - MICROSCOPIC ONLY: WBC, Ur, HPF, POC: >20 (ref 0–5)

## 2023-04-27 MED ORDER — ONDANSETRON 4 MG PO TBDP: 4.0000 mg | ORAL_TABLET | Freq: Three times a day (TID) | ORAL | 0 refills | Status: DC | PRN

## 2023-04-27 MED ORDER — SULFAMETHOXAZOLE-TRIMETHOPRIM 800-160 MG PO TABS: 1.0000 | ORAL_TABLET | Freq: Two times a day (BID) | ORAL | 0 refills | Status: DC

## 2023-04-27 MED ORDER — CEFTRIAXONE SODIUM 1 G IJ SOLR
1.0000 g | Freq: Once | INTRAMUSCULAR | Status: AC
  Administered 2023-04-27: 1 g via INTRAMUSCULAR

## 2023-04-27 NOTE — Patient Instructions (Addendum)
It appears he may have a urinary tract infection that has traveled to your kidneys given that you have had persistent fevers.  We are going to send the urine out for culture to make sure we treat it with the appropriate antibiotics.  We have given you an antibiotic here in the clinic and you will follow this with an oral one that you start tomorrow that you will take twice daily.  I have also sent in an antinausea medication called Zofran.  Please make sure that you are taking sips throughout the day, if you do feel that you are not able to keep up with your fluid intake or are throwing up your medications and please go to the ER.  I also recommend getting a drink such as Pedialyte or Gatorade Lyte that has electrolytes to sip on throughout the day.  Any sign of you not being able to keep down medications I want you to go to the ER

## 2023-04-27 NOTE — Assessment & Plan Note (Addendum)
Patient presents with 2 days of fever with nausea and vomiting and diarrhea.  Patient initially had symptoms consistent with gastroenteritis but is persisted with fevers since and has had dysuria with urgency and dribbling.  UA today concerning with nitrites and given the symptoms stomach symptoms, will treat as a pyelonephritis at this time.  Patient has recently had Augmentin course due to sinusitis, so we will need to adjust antibiotics appropriately.  Do have some concern of patient being able to maintain his adequate hydration given persistent nausea and vomiting, patient wanting to trial this at home.  Agreeable to trial of antiemetic with hydration fluids. - Urine culture sent - CBC and CMP - Rocephin 1 g IM given - Bactrim twice daily x 14 days - Zofran ODT as needed - Strict ER precautions given for worsening symptoms or inability to keep medications down.

## 2023-04-27 NOTE — Progress Notes (Signed)
    SUBJECTIVE:   CHIEF COMPLAINT / HPI:   Nausea and vomiting - Ate chicken that was sitting out on Saturday, thinks he may have had food poisoning - The next morning began having N/V/D - Has been having fevers, highest this morning was 100.43F - Has been taking scheduled Tylenol for the fevers - Not able to keep much of anything down - Had similar symptoms 5 years ago and was told it was food poisoning   Dysuria with fever - Present since Saturday - Noted previously to have pyelonephritis in 2018 per EMR - feels a pressure when he is urinating and is having more dribbling and urgency  - No frequency   PERTINENT  PMH / PSH: Reviewed  OBJECTIVE:   BP 122/79   Pulse 78   Temp 99.3 F (37.4 C)   Ht 6\' 1"  (1.854 m)   Wt 280 lb 9.6 oz (127.3 kg)   SpO2 96%   BMI 37.02 kg/m   Gen: Appears slightly unwell but not toxic, NAD CV: RRR, no m/r/g appreciated, no peripheral edema Pulm: CTAB, no wheezes/crackles GI: soft, non-tender, non-distended  ASSESSMENT/PLAN:   Pyelonephritis Patient presents with 2 days of fever with nausea and vomiting and diarrhea.  Patient initially had symptoms consistent with gastroenteritis but is persisted with fevers since and has had dysuria with urgency and dribbling.  UA today concerning with nitrites and given the symptoms stomach symptoms, will treat as a pyelonephritis at this time.  Patient has recently had Augmentin course due to sinusitis, so we will need to adjust antibiotics appropriately.  Do have some concern of patient being able to maintain his adequate hydration given persistent nausea and vomiting, patient wanting to trial this at home.  Agreeable to trial of antiemetic with hydration fluids. - Urine culture sent - Rocephin 1 g IM given - Bactrim twice daily x 14 days - Zofran ODT as needed - Strict ER precautions given for worsening symptoms or inability to keep medications down.     Evelena Leyden, DO [facility]

## 2023-04-28 ENCOUNTER — Other Ambulatory Visit: Payer: Self-pay

## 2023-04-28 ENCOUNTER — Inpatient Hospital Stay (HOSPITAL_COMMUNITY)
Admission: EM | Admit: 2023-04-28 | Discharge: 2023-05-02 | DRG: 690 | Disposition: A | Discharge status: Home / Self Care | Payer: Medicare HMO | Attending: Family Medicine | Admitting: Family Medicine

## 2023-04-28 ENCOUNTER — Encounter (HOSPITAL_COMMUNITY): Payer: Self-pay

## 2023-04-28 ENCOUNTER — Telehealth: Payer: Self-pay | Admitting: Family Medicine

## 2023-04-28 DIAGNOSIS — Z955 Presence of coronary angioplasty implant and graft: Secondary | ICD-10-CM

## 2023-04-28 DIAGNOSIS — Z950 Presence of cardiac pacemaker: Secondary | ICD-10-CM

## 2023-04-28 DIAGNOSIS — Z8249 Family history of ischemic heart disease and other diseases of the circulatory system: Secondary | ICD-10-CM

## 2023-04-28 DIAGNOSIS — N12 Tubulo-interstitial nephritis, not specified as acute or chronic: Secondary | ICD-10-CM | POA: Diagnosis not present

## 2023-04-28 DIAGNOSIS — E669 Obesity, unspecified: Secondary | ICD-10-CM | POA: Diagnosis present

## 2023-04-28 DIAGNOSIS — Z85828 Personal history of other malignant neoplasm of skin: Secondary | ICD-10-CM

## 2023-04-28 DIAGNOSIS — Z7902 Long term (current) use of antithrombotics/antiplatelets: Secondary | ICD-10-CM

## 2023-04-28 DIAGNOSIS — I480 Paroxysmal atrial fibrillation: Secondary | ICD-10-CM | POA: Diagnosis present

## 2023-04-28 DIAGNOSIS — Z820 Family history of epilepsy and other diseases of the nervous system: Secondary | ICD-10-CM

## 2023-04-28 DIAGNOSIS — Z6837 Body mass index (BMI) 37.0-37.9, adult: Secondary | ICD-10-CM

## 2023-04-28 DIAGNOSIS — E871 Hypo-osmolality and hyponatremia: Secondary | ICD-10-CM | POA: Diagnosis present

## 2023-04-28 DIAGNOSIS — R17 Unspecified jaundice: Secondary | ICD-10-CM | POA: Diagnosis present

## 2023-04-28 DIAGNOSIS — E878 Other disorders of electrolyte and fluid balance, not elsewhere classified: Secondary | ICD-10-CM | POA: Insufficient documentation

## 2023-04-28 DIAGNOSIS — K219 Gastro-esophageal reflux disease without esophagitis: Secondary | ICD-10-CM | POA: Diagnosis present

## 2023-04-28 DIAGNOSIS — E876 Hypokalemia: Secondary | ICD-10-CM | POA: Diagnosis present

## 2023-04-28 DIAGNOSIS — Z79899 Other long term (current) drug therapy: Secondary | ICD-10-CM

## 2023-04-28 DIAGNOSIS — I2510 Atherosclerotic heart disease of native coronary artery without angina pectoris: Secondary | ICD-10-CM | POA: Diagnosis present

## 2023-04-28 DIAGNOSIS — W57XXXA Bitten or stung by nonvenomous insect and other nonvenomous arthropods, initial encounter: Secondary | ICD-10-CM | POA: Insufficient documentation

## 2023-04-28 DIAGNOSIS — Z951 Presence of aortocoronary bypass graft: Secondary | ICD-10-CM

## 2023-04-28 DIAGNOSIS — I10 Essential (primary) hypertension: Secondary | ICD-10-CM | POA: Diagnosis present

## 2023-04-28 DIAGNOSIS — S80861A Insect bite (nonvenomous), right lower leg, initial encounter: Secondary | ICD-10-CM | POA: Diagnosis present

## 2023-04-28 LAB — CBC WITH DIFFERENTIAL/PLATELET
Abs Immature Granulocytes: 0.09 10*3/uL — ABNORMAL HIGH (ref 0.00–0.07)
Basophils Absolute: 0.1 10*3/uL (ref 0.0–0.2)
Basophils Absolute: 0.1 10*3/uL (ref 0.0–0.1)
Basophils Relative: 0 %
Basos: 1 %
EOS (ABSOLUTE): 0.2 10*3/uL (ref 0.0–0.4)
Eos: 2 %
Eosinophils Absolute: 0 10*3/uL (ref 0.0–0.5)
Eosinophils Relative: 0 %
HCT: 46 % (ref 39.0–52.0)
Hematocrit: 50.7 % (ref 37.5–51.0)
Hemoglobin: 17.2 g/dL (ref 13.0–17.7)
Hemoglobin: 15.9 g/dL (ref 13.0–17.0)
Immature Grans (Abs): 0.1 10*3/uL (ref 0.0–0.1)
Immature Granulocytes: 1 %
Immature Granulocytes: 1 %
Lymphocytes Absolute: 1.1 10*3/uL (ref 0.7–3.1)
Lymphocytes Relative: 8 %
Lymphs Abs: 1.1 10*3/uL (ref 0.7–4.0)
Lymphs: 7 %
MCH: 29.6 pg (ref 26.6–33.0)
MCH: 29.8 pg (ref 26.0–34.0)
MCHC: 33.9 g/dL (ref 31.5–35.7)
MCHC: 34.6 g/dL (ref 30.0–36.0)
MCV: 87 fL (ref 79–97)
MCV: 86.3 fL (ref 80.0–100.0)
Monocytes Absolute: 1.2 10*3/uL — ABNORMAL HIGH (ref 0.1–0.9)
Monocytes Absolute: 0.9 10*3/uL (ref 0.1–1.0)
Monocytes Relative: 6 %
Monocytes: 8 %
Neutro Abs: 12.4 10*3/uL — ABNORMAL HIGH (ref 1.7–7.7)
Neutrophils Absolute: 13.6 10*3/uL — ABNORMAL HIGH (ref 1.4–7.0)
Neutrophils Relative %: 85 %
Neutrophils: 81 %
Platelets: 201 10*3/uL (ref 150–450)
Platelets: 165 10*3/uL (ref 150–400)
RBC: 5.81 x10E6/uL — ABNORMAL HIGH (ref 4.14–5.80)
RBC: 5.33 MIL/uL (ref 4.22–5.81)
RDW: 12.6 % (ref 11.6–15.4)
RDW: 12.7 % (ref 11.5–15.5)
WBC: 16.4 10*3/uL — ABNORMAL HIGH (ref 3.4–10.8)
WBC: 14.6 10*3/uL — ABNORMAL HIGH (ref 4.0–10.5)
nRBC: 0 % (ref 0.0–0.2)

## 2023-04-28 LAB — URINALYSIS, W/ REFLEX TO CULTURE (INFECTION SUSPECTED)
Bilirubin Urine: NEGATIVE
Glucose, UA: NEGATIVE mg/dL
Hgb urine dipstick: NEGATIVE
Ketones, ur: NEGATIVE mg/dL
Nitrite: NEGATIVE
Protein, ur: 30 mg/dL — AB
Specific Gravity, Urine: 1.019 (ref 1.005–1.030)
pH: 6 (ref 5.0–8.0)

## 2023-04-28 LAB — COMPREHENSIVE METABOLIC PANEL
ALT: 20 IU/L (ref 0–44)
ALT: 19 U/L (ref 0–44)
AST: 25 IU/L (ref 0–40)
AST: 27 U/L (ref 15–41)
Albumin/Globulin Ratio: 1.7
Albumin: 4.3 g/dL (ref 3.9–4.9)
Albumin: 3.2 g/dL — ABNORMAL LOW (ref 3.5–5.0)
Alkaline Phosphatase: 77 IU/L (ref 44–121)
Alkaline Phosphatase: 56 U/L (ref 38–126)
Anion gap: 12 (ref 5–15)
BUN/Creatinine Ratio: 9 — ABNORMAL LOW (ref 10–24)
BUN: 10 mg/dL (ref 8–27)
BUN: 12 mg/dL (ref 8–23)
Bilirubin Total: 5.6 mg/dL — ABNORMAL HIGH (ref 0.0–1.2)
CO2: 31 mmol/L — ABNORMAL HIGH (ref 20–29)
CO2: 30 mmol/L (ref 22–32)
Calcium: 9.3 mg/dL (ref 8.6–10.2)
Calcium: 8.4 mg/dL — ABNORMAL LOW (ref 8.9–10.3)
Chloride: 89 mmol/L — ABNORMAL LOW (ref 96–106)
Chloride: 91 mmol/L — ABNORMAL LOW (ref 98–111)
Creatinine, Ser: 1.06 mg/dL (ref 0.76–1.27)
Creatinine, Ser: 1.09 mg/dL (ref 0.61–1.24)
GFR, Estimated: >60 mL/min (ref >60)
Globulin, Total: 2.6 g/dL (ref 1.5–4.5)
Glucose, Bld: 121 mg/dL — ABNORMAL HIGH (ref 70–99)
Glucose: 101 mg/dL — ABNORMAL HIGH (ref 70–99)
Potassium: 2.8 mmol/L — ABNORMAL LOW (ref 3.5–5.2)
Potassium: 2.2 mmol/L — CL (ref 3.5–5.1)
Sodium: 140 mmol/L (ref 134–144)
Sodium: 133 mmol/L — ABNORMAL LOW (ref 135–145)
Total Bilirubin: 4.9 mg/dL — ABNORMAL HIGH (ref 0.3–1.2)
Total Protein: 6.9 g/dL (ref 6.0–8.5)
Total Protein: 6.5 g/dL (ref 6.5–8.1)
eGFR: 77 mL/min/{1.73_m2} (ref >59)

## 2023-04-28 LAB — BASIC METABOLIC PANEL
Anion gap: 14 (ref 5–15)
BUN: 12 mg/dL (ref 8–23)
CO2: 26 mmol/L (ref 22–32)
Calcium: 7.9 mg/dL — ABNORMAL LOW (ref 8.9–10.3)
Chloride: 95 mmol/L — ABNORMAL LOW (ref 98–111)
Creatinine, Ser: 1.18 mg/dL (ref 0.61–1.24)
GFR, Estimated: >60 mL/min (ref >60)
Glucose, Bld: 118 mg/dL — ABNORMAL HIGH (ref 70–99)
Potassium: 3.2 mmol/L — ABNORMAL LOW (ref 3.5–5.1)
Sodium: 135 mmol/L (ref 135–145)

## 2023-04-28 LAB — LIPASE, BLOOD: Lipase: 36 U/L (ref 11–51)

## 2023-04-28 LAB — MAGNESIUM: Magnesium: 1.4 mg/dL — ABNORMAL LOW (ref 1.7–2.4)

## 2023-04-28 LAB — LACTIC ACID, PLASMA: Lactic Acid, Venous: 1.8 mmol/L (ref 0.5–1.9)

## 2023-04-28 LAB — HIV ANTIBODY (ROUTINE TESTING W REFLEX): HIV Screen 4th Generation wRfx: NONREACTIVE

## 2023-04-28 LAB — URINE CULTURE

## 2023-04-28 MED ORDER — ACETAMINOPHEN 650 MG RE SUPP: 650.0000 mg | Freq: Four times a day (QID) | RECTAL | Status: DC | PRN

## 2023-04-28 MED ORDER — ENOXAPARIN SODIUM 40 MG/0.4ML IJ SOSY
40.0000 mg | PREFILLED_SYRINGE | INTRAMUSCULAR | Status: DC
  Filled 2023-04-28: qty 0.4

## 2023-04-28 MED ORDER — PHENAZOPYRIDINE HCL 200 MG PO TABS
200.0000 mg | ORAL_TABLET | Freq: Three times a day (TID) | ORAL | Status: AC
  Administered 2023-04-29 – 2023-04-30 (×6): 200 mg via ORAL
  Filled 2023-04-28 (×6): qty 1

## 2023-04-28 MED ORDER — SODIUM CHLORIDE 0.9 % IV BOLUS
1000.0000 mL | Freq: Once | INTRAVENOUS | Status: AC
  Administered 2023-04-28: 1000 mL via INTRAVENOUS

## 2023-04-28 MED ORDER — ORAL CARE MOUTH RINSE: 15.0000 mL | OROMUCOSAL | Status: DC | PRN

## 2023-04-28 MED ORDER — POTASSIUM CHLORIDE 10 MEQ/100ML IV SOLN
10.0000 meq | INTRAVENOUS | Status: AC
  Administered 2023-04-28 (×4): 10 meq via INTRAVENOUS
  Filled 2023-04-28 (×4): qty 100

## 2023-04-28 MED ORDER — MAGNESIUM SULFATE 4 GM/100ML IV SOLN
4.0000 g | Freq: Once | INTRAVENOUS | Status: AC
  Administered 2023-04-28: 4 g via INTRAVENOUS
  Filled 2023-04-28: qty 100

## 2023-04-28 MED ORDER — PANTOPRAZOLE SODIUM 40 MG PO TBEC
40.0000 mg | DELAYED_RELEASE_TABLET | Freq: Every day | ORAL | Status: DC
  Administered 2023-04-29 – 2023-05-02 (×4): 40 mg via ORAL
  Filled 2023-04-28 (×5): qty 1

## 2023-04-28 MED ORDER — POTASSIUM CHLORIDE CRYS ER 20 MEQ PO TBCR
40.0000 meq | EXTENDED_RELEASE_TABLET | Freq: Two times a day (BID) | ORAL | Status: AC
  Administered 2023-04-28 (×2): 40 meq via ORAL
  Filled 2023-04-28 (×2): qty 2

## 2023-04-28 MED ORDER — ATORVASTATIN CALCIUM 80 MG PO TABS
80.0000 mg | ORAL_TABLET | Freq: Every day | ORAL | Status: DC
  Administered 2023-04-28 – 2023-05-01 (×4): 80 mg via ORAL
  Filled 2023-04-28 (×5): qty 1

## 2023-04-28 MED ORDER — PRASUGREL HCL 10 MG PO TABS
10.0000 mg | ORAL_TABLET | Freq: Every day | ORAL | Status: DC
  Administered 2023-04-29 – 2023-05-02 (×4): 10 mg via ORAL
  Filled 2023-04-28 (×5): qty 1

## 2023-04-28 MED ORDER — SODIUM CHLORIDE 0.9 % IV SOLN
1.0000 g | Freq: Once | INTRAVENOUS | Status: AC
  Administered 2023-04-28: 1 g via INTRAVENOUS
  Filled 2023-04-28: qty 10

## 2023-04-28 MED ORDER — ACETAMINOPHEN 325 MG PO TABS
650.0000 mg | ORAL_TABLET | Freq: Four times a day (QID) | ORAL | Status: DC | PRN
  Administered 2023-04-28 – 2023-04-30 (×6): 650 mg via ORAL
  Filled 2023-04-28 (×7): qty 2

## 2023-04-28 MED ORDER — POTASSIUM CHLORIDE CRYS ER 20 MEQ PO TBCR
40.0000 meq | EXTENDED_RELEASE_TABLET | Freq: Once | ORAL | Status: AC
  Administered 2023-04-28: 40 meq via ORAL
  Filled 2023-04-28: qty 2

## 2023-04-28 NOTE — ED Triage Notes (Signed)
Pt coming to the ED today for further workup UTI/fevers x5 days. Pt was at his PCP 2 days ago and had labs done, was called today to go to the ED due to blood in the urine and persistent reported fevers. Pt was given an antibiotic shot at his PCP as well as PO bactrim.   Pt last took tylenol at 0800 (reports he had a fever of 100.4) as well as a bactrim tablet.

## 2023-04-28 NOTE — H&P (Addendum)
Hospital Admission History and Physical Service Pager: [phone]  Patient name: Ryan Clarke Medical record number: [id number] Date of Birth: 06/27/1956 Age: 67 y.o. Gender: male  Primary Care Provider: Littie Deeds, MD Consultants: None Code Status: Full  Preferred Emergency Contact:   Ryan Clarke [phone]  Chief Complaint: Dysuria, fevers   Assessment and Plan: Ryan Clarke is a 67 y.o. male presenting with dysuria, fever x 3 days found to have pyelonephritis   Active Hospital Problems   *Pyelonephritis           Patient presents with 3 days of dysuria, fever,           nausea that is not improving despite 1 dose of           Rocephin and Bactrim prescribed outpatient. Blood           work obtained in clinic with K+ of 2.8, Bili 5.6.           Vitals stable on arrival though BP mildly soft           that improved with fluids. Will treat with           Rocephin, repeat  blood work, and await culture           results.            - admit to FPTS med tele attending Dr. Jennette Kettle            - continue Rocephin            - monitor vitals per floor            - blood culture           - CBC, CMP, lactic acid           - monitor fever curve           -Tylenol prn for pain or fever     Hyperbilirubinemia           Bilirubin 5.6 outpatient. Unclear etiology as           liver function looks normal. Possibly due to his           current infection. Repeat labs pending.     Bug bite           Patient with small erythematous lesion on RLE and           concern for joint pain and fatigue though likely           from his current infection. He is unsure what bit           him and while it is possibly a mosquito bite will           check for RMSF.   Resolved Hospital Problems No resolved problems to display.    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  Hypertension - BP soft holding Metoprolol and Clorthalidone    GERD - continue Protonix                    FEN/GI: heart healthy diet  VTE Prophylaxis: Effient   Disposition: med tele   History of Present Illness:  Ryan Clarke is a 67 y.o. male presenting with failed outpatient treatment for pyelonephritis.  Patient states he has had fevers since Saturday along with pain with urination, dark urine, nausea, diarrhea. Appetite has decreased, not drinking  much fluid. No emesis. No longer having diarrhea. Still having pain with urination was seen in the clinic yesterday and was given Rocephin and sent home with Bactrim.  Reports persistent fever, this morning was 100.4 and he took Tylenol.                                                                                                               Does have a spot on his leg and felt he was bit by something a week ago. States it was swollen and drained but now improving  Did take medications this morning   In the ED, blood pressure mildly soft, was given 1L fluid bolus. Labs (CBC, CMP, lactic acid)repeated and still pending Rocephin x 1   Review Of Systems: Per HPI   Pertinent Past Medical History: A fib  CAD HTN GERD  Pertinent Past Surgical History: CABG Stent placement   Pertinent Social History: Tobacco use: No  Alcohol use: No Other Substance use: No  Pertinent Family History:  Parents with heart disease  Important Outpatient Medications:  Metoprolol 25mg  BID Chlorthalidone 25mg   Protonix 40mg  daily Lipitor 80mg   Effient 10mg  daily  Objective: BP 103/62   Pulse 60   Temp 98.9 F (37.2 C) (Oral)   Resp 17   Ht 6\' 1"  (1.854 m)   Wt 126.6 kg   SpO2 99%   BMI 36.81 kg/m  Exam: General: alert, well appearing, NAD Eyes: Normal conjunctiva. EOMI ENTM: MMM.  Neck: normal ROM Cardiovascular: RRR  Respiratory: CTAB normal WOB  Gastrointestinal: soft, non distended. No tenderness  MSK: normal strength and tone Derm: warm, dry. Small erythematous lesion on  RLE Neuro: alert and oriented. No focal deficits Psych: mood and affect appropriate.   Labs:  CBC BMET  Recent Labs  Lab 04/28/23 1115  WBC 14.6*  HGB 15.9  HCT 46.0  PLT 165   Recent Labs  Lab 04/27/23 1741  NA 140  K 2.8*  CL 89*  CO2 31*  BUN 10  CREATININE 1.06  GLUCOSE 101*  CALCIUM 9.3     EKG: Not performed   Imaging Studies Performed:  No imaging done    Cora Collum, DO 04/28/2023, 12:46 PM PGY-3, [facility]  FPTS Intern pager: [phone], text pages welcome Secure chat group [facility] Teaching Service

## 2023-04-28 NOTE — Assessment & Plan Note (Signed)
Patient presents with 3 days of dysuria, fever, nausea that is not improving despite 1 dose of Rocephin and Bactrim prescribed outpatient. Blood work obtained in clinic with K+ of 2.8, Bili 5.6. Vitals stable on arrival though BP mildly soft that improved with fluids. Will treat with Rocephin, repeat  blood work, and await culture results.  - admit to FPTS med tele attending Dr. Jennette Kettle  - continue Rocephin  - monitor vitals per floor  - blood culture - CBC, CMP, lactic acid - monitor fever curve -Tylenol prn for pain or fever

## 2023-04-28 NOTE — Assessment & Plan Note (Signed)
Bilirubin 5.6 outpatient. Unclear etiology as liver function looks normal. Possibly due to his current infection. Repeat labs pending.

## 2023-04-28 NOTE — Plan of Care (Signed)
  Problem: Education: Goal: Knowledge of General Education information will improve Description: Including pain rating scale, medication(s)/side effects and non-pharmacologic comfort measures Outcome: Completed/Met   

## 2023-04-28 NOTE — Progress Notes (Addendum)
FMTS Interim Progress Note  S: Seen in nighttime rounds with Dr. Velna Ochs.  Patient states that he is starting to feel feverish again.  Lots of aches along his body.  Says that the pain along his lower abdomen is, and however he still has pain with urination and does have urgency-goes every hour.  No emesis but has low appetite currently.  O: BP 117/70 (BP Location: Left Arm)   Pulse 74   Temp 99.2 F (37.3 C) (Oral)   Resp 17   Ht 6\' 1"  (1.854 m)   Wt 128.3 kg   SpO2 96%   BMI 37.32 kg/m    General: NAD, awake, alert, responsive to questions Head: Normocephalic atraumatic CV: Regular rate and rhythm no murmurs rubs or gallops Respiratory: Clear to ausculation bilaterally, no wheezes rales or crackles, chest rises symmetrically,  no increased work of breathing on RA Abdomen: Soft, non-tender to palpation, non-distended, normoactive bowel sounds  Extremities: Moves upper and lower extremities freely  A/P: Pyelonephritis Patient has continued symptoms of feeling feverish and pain with urination.  Was given a dose of Rocephin this morning and a dose yesterday in clinic.  Fever to 100.5 just reported on new vitals.  I asked RN to give Tylenol for patient's symptoms.  Urine culture pending.  Reviewed prior urine culture from 11/18/2016-at that time urine culture had grown greater than 100,000 colonies of E. coli and had susceptibilities to all antibiotics except for intermediate susceptibility to ampicillin. -Continue ceftriaxone as ordered -Tylenol as needed -Pyridium added for 2 days while on antibiotics to help with urinary pain -Consider discussion of sexual hx/risk of STI -Consider abdominal imaging if start to have worsening symptoms  Hypokalemia Potassium of 2.2 on admission.  Has received 140 mill equivalents of potassium today.  On repeat check he is up to 3.2-patient was not receiving IV potassium during blood draw.  I will order another 40 mill equivalent potassium for him.   Magnesium low at 1.4 will order 4 g supplementation as well. -A.m. BMP and mag -Consider switching chlorthalidone to different antihypertensive inpatient/outpatient given significant hypokalemia  Levin Erp, MD 04/28/2023, 8:45 PM PGY-2, [facility] Family Medicine Service pager [phone]

## 2023-04-28 NOTE — ED Notes (Signed)
ED TO INPATIENT HANDOFF REPORT  ED Nurse Name and Phone #: Brett Canales [phone]  S Name/Age/Gender Ryan Clarke 67 y.o. male Room/Bed: 045C/045C  Code Status   Code Status: Full Code  Home/SNF/Other Home Patient oriented to: self, place, time, and situation Is this baseline? Yes   Triage Complete: Triage complete  Chief Complaint Pyelonephritis [N12]  Triage Note Pt coming to the ED today for further workup UTI/fevers x5 days. Pt was at his PCP 2 days ago and had labs done, was called today to go to the ED due to blood in the urine and persistent reported fevers. Pt was given an antibiotic shot at his PCP as well as PO bactrim.   Pt last took tylenol at 0800 (reports he had a fever of 100.4) as well as a bactrim tablet.    Allergies No Known Allergies  Level of Care/Admitting Diagnosis ED Disposition     ED Disposition  Admit   Condition  --   Comment  Hospital Area: [facility] [[id number]]  Level of Care: Telemetry Medical [104]  May place patient in observation at [facility] or [facility] if equivalent level of care is available:: No  Covid Evaluation: Asymptomatic - no recent exposure (last 10 days) testing not required  Diagnosis: Pyelonephritis [454098]  Admitting Physician: Cora Collum [[id number]]  Attending Physician: Nestor Ramp [[id number]]          B Medical/Surgery History Past Medical History:  Diagnosis Date   Carpal tunnel syndrome 09/10/2020   Coronary artery disease    Hypertension    Nodular basal cell carcinoma (BCC) 09/04/2021   Left Lower Back   Past Surgical History:  Procedure Laterality Date   CORONARY ARTERY BYPASS GRAFT N/A 11/26/2020   Procedure: CORONARY ARTERY BYPASS GRAFTING (CABG) TIMES TWO USING BILATERAL INTERNAL MAMMARY ARTERIES;  Surgeon: Alleen Borne, MD;  Location: [facility] OR;  Service: Open Heart Surgery;  Laterality: N/A;  BIMA   CORONARY BALLOON ANGIOPLASTY N/A 09/14/2020   Procedure: CORONARY BALLOON  ANGIOPLASTY;  Surgeon: Lennette Bihari, MD;  Location: [facility] INVASIVE CV LAB;  Service: Cardiovascular;  Laterality: N/A;   CORONARY STENT PLACEMENT     LEFT HEART CATH AND CORONARY ANGIOGRAPHY N/A 09/14/2020   Procedure: LEFT HEART CATH AND CORONARY ANGIOGRAPHY;  Surgeon: Lennette Bihari, MD;  Location: [facility] INVASIVE CV LAB;  Service: Cardiovascular;  Laterality: N/A;   LEFT HEART CATH AND CORONARY ANGIOGRAPHY N/A 11/19/2020   Procedure: LEFT HEART CATH AND CORONARY ANGIOGRAPHY;  Surgeon: Lennette Bihari, MD;  Location: [facility] INVASIVE CV LAB;  Service: Cardiovascular;  Laterality: N/A;   PACEMAKER INSERTION     TEE WITHOUT CARDIOVERSION N/A 11/26/2020   Procedure: TRANSESOPHAGEAL ECHOCARDIOGRAM (TEE);  Surgeon: Alleen Borne, MD;  Location: [facility] OR;  Service: Open Heart Surgery;  Laterality: N/A;     A IV Location/Drains/Wounds Patient Lines/Drains/Airways Status     Active Line/Drains/Airways     Name Placement date Placement time Site Days   Peripheral IV 04/28/23 18 G Anterior;Right Forearm 04/28/23  1126  Forearm  less than 1            Intake/Output Last 24 hours  Intake/Output Summary (Last 24 hours) at 04/28/2023 1257 Last data filed at 04/28/2023 1202 Gross per 24 hour  Intake 100 ml  Output --  Net 100 ml    Labs/Imaging Results for orders placed or performed during the hospital encounter of 04/28/23 (from the past 48 hour(s))  CBC with Differential  Status: Abnormal   Collection Time: 04/28/23 11:15 AM  Result Value Ref Range   WBC 14.6 (H) 4.0 - 10.5 K/uL   RBC 5.33 4.22 - 5.81 MIL/uL   Hemoglobin 15.9 13.0 - 17.0 g/dL   HCT 16.1 09.6 - 04.5 %   MCV 86.3 80.0 - 100.0 fL   MCH 29.8 26.0 - 34.0 pg   MCHC 34.6 30.0 - 36.0 g/dL   RDW 40.9 81.1 - 91.4 %   Platelets 165 150 - 400 K/uL   nRBC 0.0 0.0 - 0.2 %   Neutrophils Relative % 85 %   Neutro Abs 12.4 (H) 1.7 - 7.7 K/uL   Lymphocytes Relative 8 %   Lymphs Abs 1.1 0.7 - 4.0 K/uL   Monocytes Relative 6 %    Monocytes Absolute 0.9 0.1 - 1.0 K/uL   Eosinophils Relative 0 %   Eosinophils Absolute 0.0 0.0 - 0.5 K/uL   Basophils Relative 0 %   Basophils Absolute 0.1 0.0 - 0.1 K/uL   Immature Granulocytes 1 %   Abs Immature Granulocytes 0.09 (H) 0.00 - 0.07 K/uL    Comment: Performed at [facility], [address] [place], [place] [address]  Comprehensive metabolic panel     Status: Abnormal   Collection Time: 04/28/23 11:15 AM  Result Value Ref Range   Sodium 133 (L) 135 - 145 mmol/L   Potassium 2.2 (LL) 3.5 - 5.1 mmol/L    Comment: CRITICAL RESULT CALLED TO, READ BACK BY AND VERIFIED WITH Cindi Carbon, RN @ [phone] 04/28/23 BY SEKDAHL   Chloride 91 (L) 98 - 111 mmol/L   CO2 30 22 - 32 mmol/L   Glucose, Bld 121 (H) 70 - 99 mg/dL    Comment: Glucose reference range applies only to samples taken after fasting for at least 8 hours.   BUN 12 8 - 23 mg/dL   Creatinine, Ser 0.86 0.61 - 1.24 mg/dL   Calcium 8.4 (L) 8.9 - 10.3 mg/dL   Total Protein 6.5 6.5 - 8.1 g/dL   Albumin 3.2 (L) 3.5 - 5.0 g/dL   AST 27 15 - 41 U/L   ALT 19 0 - 44 U/L   Alkaline Phosphatase 56 38 - 126 U/L   Total Bilirubin 4.9 (H) 0.3 - 1.2 mg/dL   GFR, Estimated >57 >84 mL/min    Comment: (NOTE) Calculated using the CKD-EPI Creatinine Equation (2021)    Anion gap 12 5 - 15    Comment: Performed at [facility], [address] [place], [place] [address]  Lactic acid, plasma     Status: None   Collection Time: 04/28/23 11:15 AM  Result Value Ref Range   Lactic Acid, Venous 1.8 0.5 - 1.9 mmol/L    Comment: Performed at [facility], [address] [place], [place] [address]  Lipase, blood     Status: None   Collection Time: 04/28/23 11:15 AM  Result Value Ref Range   Lipase 36 11 - 51 U/L    Comment: Performed at [facility], [address], [place], [place] [address]   No results found.  Pending Labs Unresulted Labs (From admission, onward)     Start     Ordered   04/29/23 0500  Basic  metabolic panel  Tomorrow morning,   R        04/28/23 1159   04/29/23 0500  CBC  Tomorrow morning,   R  04/28/23 1159   04/28/23 1800  Basic metabolic panel  Once,   R        04/28/23 1255   04/28/23 1156  HIV Antibody (routine testing w rflx)  (HIV Antibody (Routine testing w reflex) panel)  Once,   R        04/28/23 1159   04/28/23 1115  Urinalysis, w/ Reflex to Culture (Infection Suspected) -Urine, Clean Catch  Once,   URGENT       Question:  Specimen Source  Answer:  Urine, Clean Catch   04/28/23 1115   04/28/23 1115  Blood culture (routine x 2)  BLOOD CULTURE X 2,   R (with STAT occurrences)      04/28/23 1115            Vitals/Pain Today's Vitals   04/28/23 1037 04/28/23 1038 04/28/23 1200  BP: 113/65  103/62  Pulse: 60  60  Resp: 16  17  Temp: 98.9 F (37.2 C)    TempSrc: Oral    SpO2: 94%  99%  Weight: 126.6 kg    Height: 6\' 1"  (1.854 m)    PainSc:  4      Isolation Precautions No active isolations  Medications Medications  atorvastatin (LIPITOR) tablet 80 mg (has no administration in time range)  pantoprazole (PROTONIX) EC tablet 40 mg (40 mg Oral Not Given 04/28/23 1251)  prasugrel (EFFIENT) tablet 10 mg (10 mg Oral Not Given 04/28/23 1252)  acetaminophen (TYLENOL) tablet 650 mg (has no administration in time range)    Or  acetaminophen (TYLENOL) suppository 650 mg (has no administration in time range)  potassium chloride 10 mEq in 100 mL IVPB (has no administration in time range)  potassium chloride SA (KLOR-CON M) CR tablet 40 mEq (has no administration in time range)  cefTRIAXone (ROCEPHIN) 1 g in sodium chloride 0.9 % 100 mL IVPB (0 g Intravenous Stopped 04/28/23 1202)  sodium chloride 0.9 % bolus 1,000 mL (0 mLs Intravenous Stopped 04/28/23 1245)    Mobility walks     Focused Assessments Renal Assessment Handoff: Patient being treated for UTI.  Low potassium, replacement ordered   R Recommendations: See Admitting Provider  Note  Report given to:   Additional Notes:

## 2023-04-28 NOTE — ED Provider Notes (Signed)
[facility]ERGENCY DEPARTMENT AT [facility] Provider Note   CSN: [id number] Arrival date & time: 04/28/23  1030     History  Chief Complaint  Patient presents with   Fever   Urinary Frequency    Ryan Clarke is a 67 y.o. male.  The history is provided by the patient and medical records. No language interpreter was used.  Fever Max temp prior to arrival:  100.4 Temp source:  Oral Severity:  Moderate Onset quality:  Gradual Duration:  4 days Timing:  Constant Progression:  Waxing and waning Chronicity:  New Relieved by:  Acetaminophen Worsened by:  Nothing Ineffective treatments:  None tried Associated symptoms: chills, diarrhea, dysuria, myalgias, nausea and rash (r knee red area from bite)   Associated symptoms: no chest pain, no confusion, no congestion, no cough, no headaches, no sore throat and no vomiting   Urinary Frequency Pertinent negatives include no chest pain, no abdominal pain, no headaches and no shortness of breath.       Home Medications Prior to Admission medications   Medication Sig Start Date End Date Taking? Authorizing Provider  atorvastatin (LIPITOR) 80 MG tablet TAKE 1 TABLET BY MOUTH EVERYDAY AT BEDTIME 04/17/23   Jodelle Red, MD  chlorthalidone (HYGROTON) 25 MG tablet TAKE 1 TABLET (25 MG TOTAL) BY MOUTH DAILY. 12/01/22 11/26/23  Jodelle Red, MD  metoprolol tartrate (LOPRESSOR) 25 MG tablet TAKE 1 TABLET BY MOUTH TWICE A DAY 10/15/22   Jodelle Red, MD  ondansetron (ZOFRAN-ODT) 4 MG disintegrating tablet Take 1 tablet (4 mg total) by mouth every 8 (eight) hours as needed for nausea or vomiting. 04/27/23   Lilland, Alana, DO  pantoprazole (PROTONIX) 40 MG tablet TAKE 1 TABLET BY MOUTH EVERY DAY 03/11/23   Littie Deeds, MD  prasugrel (EFFIENT) 10 MG TABS tablet TAKE 1 TABLET BY MOUTH EVERY DAY 12/01/22   Jodelle Red, MD  sulfamethoxazole-trimethoprim (BACTRIM DS) 800-160 MG tablet Take 1 tablet by  mouth 2 (two) times daily for 14 days. 04/27/23 05/11/23  Evelena Leyden, DO      Allergies    Patient has no known allergies.    Review of Systems   Review of Systems  Constitutional:  Positive for chills, fatigue and fever. Negative for diaphoresis.  HENT:  Negative for congestion and sore throat.   Eyes:  Negative for visual disturbance.  Respiratory:  Negative for cough, chest tightness, shortness of breath and wheezing.   Cardiovascular:  Negative for chest pain, palpitations and leg swelling.  Gastrointestinal:  Positive for diarrhea and nausea. Negative for abdominal pain and vomiting.  Genitourinary:  Positive for dysuria and frequency. Negative for genital sores.  Musculoskeletal:  Positive for myalgias. Negative for back pain, neck pain and neck stiffness.  Skin:  Positive for rash (r knee red area from bite). Negative for wound.  Neurological:  Negative for weakness, light-headedness, numbness and headaches.  Psychiatric/Behavioral:  Negative for agitation and confusion.   All other systems reviewed and are negative.   Physical Exam Updated Vital Signs BP 113/65   Pulse 60   Temp 98.9 F (37.2 C) (Oral)   Resp 16   Ht 6\' 1"  (1.854 m)   Wt 126.6 kg   SpO2 94%   BMI 36.81 kg/m  Physical Exam Vitals and nursing note reviewed.  Constitutional:      General: He is not in acute distress.    Appearance: He is well-developed. He is not ill-appearing, toxic-appearing or diaphoretic.  HENT:  Head: Normocephalic and atraumatic.     Nose: Nose normal.     Mouth/Throat:     Mouth: Mucous membranes are moist.     Pharynx: No oropharyngeal exudate or posterior oropharyngeal erythema.  Eyes:     Conjunctiva/sclera: Conjunctivae normal.     Pupils: Pupils are equal, round, and reactive to light.  Cardiovascular:     Rate and Rhythm: Normal rate and regular rhythm.     Pulses: Normal pulses.     Heart sounds: No murmur heard. Pulmonary:     Effort: Pulmonary effort is  normal. No respiratory distress.     Breath sounds: Normal breath sounds. No wheezing, rhonchi or rales.  Chest:     Chest wall: No tenderness.  Abdominal:     General: Abdomen is flat.     Palpations: Abdomen is soft.     Tenderness: There is no abdominal tenderness. There is no right CVA tenderness, left CVA tenderness, guarding or rebound.  Musculoskeletal:        General: No swelling or tenderness.     Cervical back: Neck supple. No tenderness.     Right lower leg: No edema.     Left lower leg: No edema.  Skin:    General: Skin is warm and dry.     Capillary Refill: Capillary refill takes less than 2 seconds.     Findings: Erythema present.  Neurological:     General: No focal deficit present.     Mental Status: He is alert.     Sensory: No sensory deficit.     Motor: No weakness.  Psychiatric:        Mood and Affect: Mood normal.     ED Results / Procedures / Treatments   Labs (all labs ordered are listed, but only abnormal results are displayed) Labs Reviewed  CBC WITH DIFFERENTIAL/PLATELET - Abnormal; Notable for the following components:      Result Value   WBC 14.6 (*)    Neutro Abs 12.4 (*)    Abs Immature Granulocytes 0.09 (*)    All other components within normal limits  COMPREHENSIVE METABOLIC PANEL - Abnormal; Notable for the following components:   Sodium 133 (*)    Potassium 2.2 (*)    Chloride 91 (*)    Glucose, Bld 121 (*)    Calcium 8.4 (*)    Albumin 3.2 (*)    Total Bilirubin 4.9 (*)    All other components within normal limits  CULTURE, BLOOD (ROUTINE X 2)  CULTURE, BLOOD (ROUTINE X 2)  LACTIC ACID, PLASMA  LIPASE, BLOOD  URINALYSIS, W/ REFLEX TO CULTURE (INFECTION SUSPECTED)  HIV ANTIBODY (ROUTINE TESTING W REFLEX)  BASIC METABOLIC PANEL    EKG EKG Interpretation  Date/Time:  Tuesday April 28 2023 13:28:09 EDT Ventricular Rate:  60 PR Interval:  196 QRS Duration: 166 QT Interval:  465 QTC Calculation: 465 R Axis:   -36 Text  Interpretation: Sinus rhythm Right bundle branch block when compared to prior, similar appearance. No STEMI Confirmed by Theda Belfast ([id number]) on 04/28/2023 1:45:34 PM  Radiology No results found.  Procedures Procedures    CRITICAL CARE Performed by: Ryan Clarke [name] Total critical care time: 20 minutes Critical care time was exclusive of separately billable procedures and treating other patients. Critical care was necessary to treat or prevent imminent or life-threatening deterioration. Critical care was time spent personally by me on the following activities: development of treatment plan with patient and/or surrogate as well as nursing,  discussions with consultants, evaluation of patient's response to treatment, examination of patient, obtaining history from patient or surrogate, ordering and performing treatments and interventions, ordering and review of laboratory studies, ordering and review of radiographic studies, pulse oximetry and re-evaluation of patient's condition.  Medications Ordered in ED Medications  atorvastatin (LIPITOR) tablet 80 mg (has no administration in time range)  pantoprazole (PROTONIX) EC tablet 40 mg (40 mg Oral Not Given 04/28/23 1251)  prasugrel (EFFIENT) tablet 10 mg (10 mg Oral Not Given 04/28/23 1252)  acetaminophen (TYLENOL) tablet 650 mg (has no administration in time range)    Or  acetaminophen (TYLENOL) suppository 650 mg (has no administration in time range)  potassium chloride 10 mEq in 100 mL IVPB (10 mEq Intravenous New Bag/Given 04/28/23 1418)  potassium chloride SA (KLOR-CON M) CR tablet 40 mEq (40 mEq Oral Given 04/28/23 1313)  cefTRIAXone (ROCEPHIN) 1 g in sodium chloride 0.9 % 100 mL IVPB (0 g Intravenous Stopped 04/28/23 1202)  sodium chloride 0.9 % bolus 1,000 mL (0 mLs Intravenous Stopped 04/28/23 1245)    ED Course/ Medical Decision Making/ A&P                             Medical Decision Making Amount and/or Complexity of Data  Reviewed Labs: ordered.  Risk Decision regarding hospitalization.    Ryan Clarke is a 67 y.o. male with past medical history significant for CAD with PCI and previous CABG, hypertension, and GERD presents to the direction of his family medicine PCP for admission for suspected worsening pyelonephritis.  According to patient, he said symptoms for the last 5 days or so and has had some fevers including this morning over 100.  He is also had nausea but no vomiting, some diarrhea, but no constipation.  He has had no significant chest pain abdominal pain or back pain and denies any cough or congestion but has diffuse joint aches and is having urinary symptoms with dysuria.  He reports he was told he had a urinary tract infection and had labs done yesterday.  He was given a shot of Rocephin and sent home with antibiotics orally but says he has been taking them and is not helping.  He is feeling worse today and when he called his doctor they told him to come to the hospital to be admitted.  On my first evaluation blood pressures in the 90s systolic.  He is feeling fatigued.  Lungs were clear and chest was nontender.  Abdomen nontender.  CVA is nontender.  Patient resting.  He has a small red spot on his right knee that he reports was due to a bug bite last week.  He does not have it was a tick and denies history of Lyme disease will recommend spotted fever.  Otherwise he is not complaining of headache or neck stiffness at this time.  Patient otherwise resting.  Reports his nausea is mild now.  Will get repeat labs and call family medicine for admission.  Will give some fluids for soft pressures and some Rocephin.  Will determine if they want [place] to order a [facility] spotted fever test or not and anticipate admission based on the concern for worsening pyelonephritis failing outpatient medications.  Patient will be admitted by family medicine.  Potassium was found to be quite low at 2.2.  They ordered  potassium supplementation.  He does a leukocytosis.  They will admit for further management.  Final Clinical Impression(s) / ED Diagnoses Final diagnoses:  Pyelonephritis     Clinical Impression: 1. Pyelonephritis     Disposition: Admit  This note was prepared with assistance of Dragon voice recognition software. Occasional wrong-word or sound-a-like substitutions may have occurred due to the inherent limitations of voice recognition software.      Chinonso Linker, Ryan Brim, MD 04/28/23 1501

## 2023-04-28 NOTE — Telephone Encounter (Signed)
Called patient regarding the concerning results.  Patient is currently being treated for pyelonephritis and labs came back concerning with significant hypokalemia as well as hyperbilirubinemia.  Patient reports that he was feeling little bit better last night but has begun to fever again this morning.  Given the concerning labs and his presentation overall, feel that it is appropriate for patient to be admitted to the hospital.  We will call for attempt at direct admission.  Discussed with patient ER precautions for if he is feeling worse in the meantime.   Sherrita Riederer, DO

## 2023-04-28 NOTE — Progress Notes (Signed)
NEW ADMISSION NOTE New Admission Note:   Arrival Method: Patient arrived from ED on stretcher accompanied by staff. Mental Orientation: alert and oriented x 4. Telemetry: 68M-09 Pacing Assessment: Completed Skin: warm, dry and intact. IV: R FA infusing Potassium @100ml /hr Pain: Denies any pain. Tubes: N/A Safety Measures: Safety Fall Prevention Plan has been given, discussed and signed Admission: Completed [facility] Orientation: Patient has been orientated to the room, unit and staff.  Family: NOne at bedside.  Orders have been reviewed and implemented. Will continue to monitor the patient. Call light has been placed within reach and bed alarm has been activated.   Arvilla Meres, RN

## 2023-04-28 NOTE — Assessment & Plan Note (Addendum)
Patient with small erythematous lesion on RLE and concern for joint pain and fatigue though likely from his current infection. He is unsure what bit him and while it is possibly a mosquito bite will check for RMSF.

## 2023-04-29 DIAGNOSIS — N12 Tubulo-interstitial nephritis, not specified as acute or chronic: Secondary | ICD-10-CM | POA: Diagnosis not present

## 2023-04-29 DIAGNOSIS — E878 Other disorders of electrolyte and fluid balance, not elsewhere classified: Secondary | ICD-10-CM | POA: Insufficient documentation

## 2023-04-29 LAB — RETICULOCYTES
Immature Retic Fract: 16.7 % — ABNORMAL HIGH (ref 2.3–15.9)
RBC.: 5.21 MIL/uL (ref 4.22–5.81)
Retic Count, Absolute: 71.9 10*3/uL (ref 19.0–186.0)
Retic Ct Pct: 1.4 % (ref 0.4–3.1)

## 2023-04-29 LAB — CBC
HCT: 45.5 % (ref 39.0–52.0)
HCT: 46.9 % (ref 39.0–52.0)
Hemoglobin: 15.5 g/dL (ref 13.0–17.0)
Hemoglobin: 16 g/dL (ref 13.0–17.0)
MCH: 29.8 pg (ref 26.0–34.0)
MCH: 29.5 pg (ref 26.0–34.0)
MCHC: 34.1 g/dL (ref 30.0–36.0)
MCHC: 34.1 g/dL (ref 30.0–36.0)
MCV: 87.3 fL (ref 80.0–100.0)
MCV: 86.4 fL (ref 80.0–100.0)
Platelets: 154 10*3/uL (ref 150–400)
Platelets: 150 10*3/uL (ref 150–400)
RBC: 5.21 MIL/uL (ref 4.22–5.81)
RBC: 5.43 MIL/uL (ref 4.22–5.81)
RDW: 12.7 % (ref 11.5–15.5)
RDW: 12.7 % (ref 11.5–15.5)
WBC: 7.6 10*3/uL (ref 4.0–10.5)
WBC: 5.1 10*3/uL (ref 4.0–10.5)
nRBC: 0 % (ref 0.0–0.2)
nRBC: 0 % (ref 0.0–0.2)

## 2023-04-29 LAB — URINE CULTURE: Culture: <10000 — AB

## 2023-04-29 LAB — BASIC METABOLIC PANEL
Anion gap: 11 (ref 5–15)
Anion gap: 12 (ref 5–15)
Anion gap: 10 (ref 5–15)
BUN: 11 mg/dL (ref 8–23)
BUN: 10 mg/dL (ref 8–23)
BUN: 12 mg/dL (ref 8–23)
CO2: 31 mmol/L (ref 22–32)
CO2: 26 mmol/L (ref 22–32)
CO2: 25 mmol/L (ref 22–32)
Calcium: 8.2 mg/dL — ABNORMAL LOW (ref 8.9–10.3)
Calcium: 8.3 mg/dL — ABNORMAL LOW (ref 8.9–10.3)
Calcium: 7.8 mg/dL — ABNORMAL LOW (ref 8.9–10.3)
Chloride: 93 mmol/L — ABNORMAL LOW (ref 98–111)
Chloride: 93 mmol/L — ABNORMAL LOW (ref 98–111)
Chloride: 96 mmol/L — ABNORMAL LOW (ref 98–111)
Creatinine, Ser: 1.23 mg/dL (ref 0.61–1.24)
Creatinine, Ser: 1.01 mg/dL (ref 0.61–1.24)
Creatinine, Ser: 1.08 mg/dL (ref 0.61–1.24)
GFR, Estimated: >60 mL/min (ref >60)
GFR, Estimated: >60 mL/min (ref >60)
GFR, Estimated: >60 mL/min (ref >60)
Glucose, Bld: 110 mg/dL — ABNORMAL HIGH (ref 70–99)
Glucose, Bld: 157 mg/dL — ABNORMAL HIGH (ref 70–99)
Glucose, Bld: 210 mg/dL — ABNORMAL HIGH (ref 70–99)
Potassium: 3 mmol/L — ABNORMAL LOW (ref 3.5–5.1)
Potassium: 2.6 mmol/L — CL (ref 3.5–5.1)
Potassium: 3.1 mmol/L — ABNORMAL LOW (ref 3.5–5.1)
Sodium: 135 mmol/L (ref 135–145)
Sodium: 131 mmol/L — ABNORMAL LOW (ref 135–145)
Sodium: 131 mmol/L — ABNORMAL LOW (ref 135–145)

## 2023-04-29 LAB — DIFFERENTIAL
Abs Immature Granulocytes: 0.03 10*3/uL (ref 0.00–0.07)
Basophils Absolute: 0 10*3/uL (ref 0.0–0.1)
Basophils Relative: 1 %
Eosinophils Absolute: 0 10*3/uL (ref 0.0–0.5)
Eosinophils Relative: 0 %
Immature Granulocytes: 1 %
Lymphocytes Relative: 9 %
Lymphs Abs: 0.4 10*3/uL — ABNORMAL LOW (ref 0.7–4.0)
Monocytes Absolute: 0.3 10*3/uL (ref 0.1–1.0)
Monocytes Relative: 6 %
Neutro Abs: 4.3 10*3/uL (ref 1.7–7.7)
Neutrophils Relative %: 83 %

## 2023-04-29 LAB — MAGNESIUM
Magnesium: 1.4 mg/dL — ABNORMAL LOW (ref 1.7–2.4)
Magnesium: 2.2 mg/dL (ref 1.7–2.4)

## 2023-04-29 LAB — TECHNOLOGIST SMEAR REVIEW

## 2023-04-29 LAB — HEPATIC FUNCTION PANEL
ALT: 23 U/L (ref 0–44)
AST: 35 U/L (ref 15–41)
Albumin: 3 g/dL — ABNORMAL LOW (ref 3.5–5.0)
Alkaline Phosphatase: 47 U/L (ref 38–126)
Bilirubin, Direct: 0.4 mg/dL — ABNORMAL HIGH (ref 0.0–0.2)
Indirect Bilirubin: 3.3 mg/dL — ABNORMAL HIGH (ref 0.3–0.9)
Total Bilirubin: 3.7 mg/dL — ABNORMAL HIGH (ref 0.3–1.2)
Total Protein: 6.3 g/dL — ABNORMAL LOW (ref 6.5–8.1)

## 2023-04-29 MED ORDER — POTASSIUM CHLORIDE CRYS ER 20 MEQ PO TBCR
40.0000 meq | EXTENDED_RELEASE_TABLET | Freq: Once | ORAL | Status: AC
  Administered 2023-04-29: 40 meq via ORAL
  Filled 2023-04-29: qty 2

## 2023-04-29 MED ORDER — SODIUM CHLORIDE 0.9 % IV SOLN
1.0000 g | INTRAVENOUS | Status: DC
  Administered 2023-04-29: 1 g via INTRAVENOUS
  Filled 2023-04-29: qty 10

## 2023-04-29 MED ORDER — POTASSIUM CHLORIDE 10 MEQ/100ML IV SOLN
10.0000 meq | INTRAVENOUS | Status: AC
  Administered 2023-04-29 (×4): 10 meq via INTRAVENOUS
  Filled 2023-04-29 (×4): qty 100

## 2023-04-29 MED ORDER — CEFADROXIL 500 MG PO CAPS: 500.0000 mg | ORAL_CAPSULE | Freq: Two times a day (BID) | ORAL | Status: DC

## 2023-04-29 MED ORDER — MAGNESIUM SULFATE 4 GM/100ML IV SOLN
4.0000 g | Freq: Once | INTRAVENOUS | Status: AC
  Administered 2023-04-29: 4 g via INTRAVENOUS
  Filled 2023-04-29: qty 100

## 2023-04-29 MED ORDER — SODIUM CHLORIDE 0.9 % IV SOLN: INTRAVENOUS | Status: DC | PRN

## 2023-04-29 MED ORDER — POTASSIUM CHLORIDE 10 MEQ/100ML IV SOLN
10.0000 meq | INTRAVENOUS | Status: AC
  Administered 2023-04-29 – 2023-04-30 (×4): 10 meq via INTRAVENOUS
  Filled 2023-04-29 (×4): qty 100

## 2023-04-29 MED ORDER — ENSURE ENLIVE PO LIQD
237.0000 mL | Freq: Two times a day (BID) | ORAL | Status: DC
  Administered 2023-04-29 – 2023-05-02 (×7): 237 mL via ORAL

## 2023-04-29 NOTE — Progress Notes (Signed)
Daily Progress Note Intern Pager: [phone]  Patient name: Ryan Clarke Medical record number: [id number] Date of birth: [birth year] Age: 67 y.o. Gender: male  Primary Care Provider: Littie Deeds, MD Consultants: None Code Status: Full  Pt Overview and Major Events to Date:  6/11-admitted  Assessment and Plan:  Ryan Clarke is a 67 year old male presenting with dysuria and fever for 3 days found to have likely pyelonephritis. Pertinent PMH/PSH includes UTI with E. 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  Active Hospital Problems   *Pyelonephritis           Mildly febrile overnight to 100.5.  UA with small           leukocytes, 30 protein, rare bacteria.  Urine,           blood culture pending.  Creatinine bumped to 1.23.           Consider BPH as cause of postrenal obstruction           causing pyelonephritis given report of urinary           urgency with dribbling.- S/p 2 doses of Rocephin,           will transition to cefadroxil 500 mg twice            daily for 4 more days           - monitor vitals per floor            - blood, urine culture           - A.m. CMP           - monitor fever curve           - Tylenol prn for pain or fever     Electrolyte abnormality           K 3.0 and Mg 1.4 this AM after multiple rounds of           supplementation. Unclear cause; do not suspect GI           losses or decreased PO intake as culprit.           Potentially secondary to infection. Also consider           secondary to diuretic use; however, he has not           been on this in the hospital. Will continue           further workup as per other problems.           -Continue supplementation as needed           -Trend BMP, Mg           -Consult nephrology if lytes remain low    Gilbert syndrome           Bilirubin 5.6 outpatient on admission.  Has been           increased on nearly every test he has obtained,           with a level of  4.5 in 2018 as well.  Total bili           this morning 3.7 with indirect portion 3.3. Could           be due to infection. Less likely due to increased           production/hemolysis, as patient does have  hemoglobin levels on the higher end of normal.            Less likely due to hepatic injury given low direct           component.  Consider some drug component, though           daily drugs would be less likely to cause this.            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 This, in           conjunction with abdominal pain, fever, ?joint           pain, elevated bilirubin, and decreasing platelet           counts and electrolyte derangements, raises some           suspicion for RMSF. Lack of rash would be less           likely; however, rash can be absent in early           stages of infection.           -RMSF ab           -Blood smear           -Retic count           -Consider doxycycline if RMSF suspicion remains           high   Resolved Hospital Problems No resolved problems to display.   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  Hypertension - BP soft holding Metoprolol and Clorthalidone  GERD - continue Protonix                    FEN/GI: Heart healthy diet PPx: Effient Dispo: Pending clinical improvement  Subjective:  Doing well this morning without pain.  He did go home as soon as he can.  He is monogamous with his wife.  He does not have any symptoms of dribbling or urinary frequency other than during this episode (and another episode a couple years ago he  was also diagnosed with UTI).  He does not use any urinary catheters.  Objective: Temp:  [98.5 F (36.9 C)-100.5 F (38.1 C)] 98.5 F (36.9 C) (06/12 0949) Pulse Rate:  [68-94] 94 (06/12 0949) Resp:  [17-20] 18 (06/12 0949) BP: (104-117)/(56-81) 111/67 (06/12 0949) SpO2:  [91 %-99 %] 95 % (06/12 0949) Weight:  [128.3 kg] 128.3 kg (06/11 1442) Physical Exam: General: Lying in bed, no acute distress Cardiovascular: Regular rate and rhythm without murmurs rubs or gallops Respiratory: Clear to auscultation bilaterally anteriorly without wheezes rales or rhonchi Abdomen: Soft, nontender, nondistended, normoactive bowel sounds Extremities: Moves all extremities grossly equally in bed  Laboratory: Most recent CBC Lab Results  Component Value Date   WBC 5.1 04/29/2023   HGB 16.0 04/29/2023   HCT 46.9 04/29/2023   MCV 86.4 04/29/2023   PLT 150 04/29/2023   Most recent BMP    Latest Ref Rng & Units 04/29/2023    1:44 AM  BMP  Glucose 70 - 99 mg/dL 960   BUN 8 - 23 mg/dL 11   Creatinine 4.54 - 1.24 mg/dL 0.98   Sodium 119 - 147 mmol/L 135   Potassium 3.5 - 5.1 mmol/L 3.0   Chloride 98 - 111 mmol/L 93   CO2 22 - 32 mmol/L 31   Calcium 8.9 - 10.3 mg/dL 8.2    [name], MD 04/29/2023, 12:11 PM PGY-1, [facility] FPTS Intern pager: [phone], text pages welcome Secure chat group [facility]

## 2023-04-29 NOTE — Assessment & Plan Note (Addendum)
K 3.0 and Mg 1.4 this AM after multiple rounds of supplementation. Unclear cause; do not suspect GI losses or decreased PO intake as culprit. Potentially secondary to infection. Also consider secondary to diuretic use; however, he has not been on this in the hospital. Will continue further workup as per other problems. -Continue supplementation as needed -Trend BMP, Mg -Consult nephrology if lytes remain low

## 2023-04-29 NOTE — Hospital Course (Addendum)
Ryan Clarke is a 67 y.o. male who was admitted to the [facility] Medicine Teaching Service at [facility] for pyelonephritis. Hospital course is outlined below by problem.   Pyelonephritis Admitted with dysuria, nausea, and "home reported" fever for 3 days.  No true documented fever in hospital. Abdominal symptoms did not improve with 1 dose of Rocephin and Bactrim outpatient.  Blood work in clinic with potassium 2.8, bilirubin 5.6.  BP mildly soft on admission.  UA collected with small leukocytes, 30 protein, rare bacteria.  Urine culture with less than 10,000 colonies of E. coli.  He was given Rocephin with resolution of symptoms by the next day.  He was transitioned to cefadroxil for a total 6-day antibiotic course at discharge.  Electrolyte abnormality K3.0 and mag 1.4 on admission and remained similar after multiple rounds of supplementation.  Potentially due to dietary issues vs. Infection vs. renal component.  However, given on presentation of electrolytes and recurrent UTI, consulted nephrology who recommended sodium/potassium/creatinine urine and renal ultrasound which demonstrated***.  By discharge,***  Afib with RVR Had episode over admission of RVR with chest pain and diaphoresis.  EKG at that time was confirmatory.  Chest X-ray without acute process.  He was started on IV metoprolol with good rate control and symptom resolution.  He was transitioned to home metoprolol dosing without further events.  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  Appears he had a level of 4.5 in 2018 with multiple other elevations in between.  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   Other labs were obtained to rule out RMSF given elevated bilirubin, which abnormality, and abdominal pain-these are pending at discharge.  Other conditions that were chronic and stable: ***  Issues for follow up: Follow-up  workup of recurrent UTI-less likely post renal obstruction, sexual practices, no catheterization Follow-up electrolytes

## 2023-04-29 NOTE — TOC CM/SW Note (Signed)
Transition of Care [facility]) - Inpatient Brief Assessment   Patient Details  Name: Ryan Clarke MRN: [id number] Date of Birth: [birth year]  [facility]) CM/SW Contact:    Tom-Johnson, Hershal Coria, RN Phone Number: 04/29/2023, 2:14 PM   Clinical Narrative:  Patient is admitted with Dysuria and Fever x 3 days and found to have Pyelonephritis. On IV abx.   From home with wife, has two supportive children. Retired, independent with care prior to admission and drive self. Does not have DME's at home.  PCP is Littie Deeds, MD and uses [facility] in [place] at [address] [facility].     No TOC needs or recommendations noted at this time. CM will continue to follow as patient progresses with care towards discharge.    Transition of Care Asessment: Insurance and Status: Insurance coverage has been reviewed Patient has primary care physician: Yes Home environment has been reviewed: Yes Prior level of function:: Independent Prior/Current Home Services: No current home services Social Determinants of Health Reivew: SDOH reviewed no interventions necessary Readmission risk has been reviewed: Yes Transition of care needs: no transition of care needs at this time

## 2023-04-29 NOTE — Care Management Obs Status (Signed)
MEDICARE OBSERVATION STATUS NOTIFICATION   Patient Details  Name: Ryan Clarke MRN: [id number] Date of Birth: 09-08-1956   Medicare Observation Status Notification Given:  Yes    Tom-Johnson, Hershal Coria, RN 04/29/2023, 2:11 PM

## 2023-04-29 NOTE — Assessment & Plan Note (Addendum)
Patient with small erythematous lesion on RLE and concern for joint pain and fatigue. This, in conjunction with abdominal pain, fever, ?joint pain, elevated bilirubin, and decreasing platelet counts and electrolyte derangements, raises some suspicion for RMSF. Lack of rash would be less likely; however, rash can be absent in early stages of infection. -RMSF ab -Blood smear -Retic count -Consider doxycycline if RMSF suspicion remains high

## 2023-04-29 NOTE — Assessment & Plan Note (Addendum)
Bilirubin 5.6 outpatient on admission.  Has been increased on nearly every test he has obtained, with a level of 4.5 in 2018 as well.  Total bili this morning 3.7 with indirect portion 3.3. Could be due to infection. Less likely due to increased production/hemolysis, as patient does have hemoglobin levels on the higher end of normal.  Less likely due to hepatic injury given low direct component.  Consider some drug component, though daily drugs would be less likely to cause this.  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

## 2023-04-29 NOTE — Assessment & Plan Note (Addendum)
Mildly febrile overnight to 100.5.  UA with small leukocytes, 30 protein, rare bacteria.  Urine, blood culture pending.  Creatinine bumped to 1.23.  Consider BPH as cause of postrenal obstruction causing pyelonephritis given report of urinary urgency with dribbling. - S/p 2 doses of Rocephin, will transition to cefadroxil 500 mg twice daily for 4 more days - monitor vitals per floor  - blood, urine culture - A.m. CMP - monitor fever curve - Tylenol prn for pain or fever

## 2023-04-30 ENCOUNTER — Observation Stay (HOSPITAL_COMMUNITY): Payer: Medicare HMO

## 2023-04-30 ENCOUNTER — Other Ambulatory Visit (HOSPITAL_COMMUNITY): Payer: Self-pay

## 2023-04-30 DIAGNOSIS — Z8249 Family history of ischemic heart disease and other diseases of the circulatory system: Secondary | ICD-10-CM | POA: Diagnosis not present

## 2023-04-30 DIAGNOSIS — R0609 Other forms of dyspnea: Secondary | ICD-10-CM | POA: Diagnosis not present

## 2023-04-30 DIAGNOSIS — E871 Hypo-osmolality and hyponatremia: Secondary | ICD-10-CM | POA: Diagnosis present

## 2023-04-30 DIAGNOSIS — I10 Essential (primary) hypertension: Secondary | ICD-10-CM | POA: Diagnosis present

## 2023-04-30 DIAGNOSIS — Z85828 Personal history of other malignant neoplasm of skin: Secondary | ICD-10-CM | POA: Diagnosis not present

## 2023-04-30 DIAGNOSIS — Z955 Presence of coronary angioplasty implant and graft: Secondary | ICD-10-CM | POA: Diagnosis not present

## 2023-04-30 DIAGNOSIS — K219 Gastro-esophageal reflux disease without esophagitis: Secondary | ICD-10-CM | POA: Diagnosis present

## 2023-04-30 DIAGNOSIS — Z950 Presence of cardiac pacemaker: Secondary | ICD-10-CM | POA: Diagnosis not present

## 2023-04-30 DIAGNOSIS — Z6837 Body mass index (BMI) 37.0-37.9, adult: Secondary | ICD-10-CM | POA: Diagnosis not present

## 2023-04-30 DIAGNOSIS — I2510 Atherosclerotic heart disease of native coronary artery without angina pectoris: Secondary | ICD-10-CM | POA: Diagnosis present

## 2023-04-30 DIAGNOSIS — Z7902 Long term (current) use of antithrombotics/antiplatelets: Secondary | ICD-10-CM | POA: Diagnosis not present

## 2023-04-30 DIAGNOSIS — W57XXXA Bitten or stung by nonvenomous insect and other nonvenomous arthropods, initial encounter: Secondary | ICD-10-CM | POA: Diagnosis present

## 2023-04-30 DIAGNOSIS — S80861A Insect bite (nonvenomous), right lower leg, initial encounter: Secondary | ICD-10-CM | POA: Diagnosis present

## 2023-04-30 DIAGNOSIS — N12 Tubulo-interstitial nephritis, not specified as acute or chronic: Secondary | ICD-10-CM | POA: Diagnosis not present

## 2023-04-30 DIAGNOSIS — I480 Paroxysmal atrial fibrillation: Secondary | ICD-10-CM | POA: Diagnosis present

## 2023-04-30 DIAGNOSIS — Z79899 Other long term (current) drug therapy: Secondary | ICD-10-CM | POA: Diagnosis not present

## 2023-04-30 DIAGNOSIS — R17 Unspecified jaundice: Secondary | ICD-10-CM | POA: Diagnosis present

## 2023-04-30 DIAGNOSIS — Z951 Presence of aortocoronary bypass graft: Secondary | ICD-10-CM | POA: Diagnosis not present

## 2023-04-30 DIAGNOSIS — Z820 Family history of epilepsy and other diseases of the nervous system: Secondary | ICD-10-CM | POA: Diagnosis not present

## 2023-04-30 DIAGNOSIS — E876 Hypokalemia: Secondary | ICD-10-CM | POA: Diagnosis present

## 2023-04-30 DIAGNOSIS — E669 Obesity, unspecified: Secondary | ICD-10-CM | POA: Diagnosis present

## 2023-04-30 LAB — CBC WITH DIFFERENTIAL/PLATELET
Abs Immature Granulocytes: 0.01 10*3/uL (ref 0.00–0.07)
Basophils Absolute: 0 10*3/uL (ref 0.0–0.1)
Basophils Relative: 1 %
Eosinophils Absolute: 0 10*3/uL (ref 0.0–0.5)
Eosinophils Relative: 1 %
HCT: 46.2 % (ref 39.0–52.0)
Hemoglobin: 15.8 g/dL (ref 13.0–17.0)
Immature Granulocytes: 0 %
Lymphocytes Relative: 25 %
Lymphs Abs: 0.9 10*3/uL (ref 0.7–4.0)
MCH: 29.3 pg (ref 26.0–34.0)
MCHC: 34.2 g/dL (ref 30.0–36.0)
MCV: 85.6 fL (ref 80.0–100.0)
Monocytes Absolute: 0.6 10*3/uL (ref 0.1–1.0)
Monocytes Relative: 17 %
Neutro Abs: 2 10*3/uL (ref 1.7–7.7)
Neutrophils Relative %: 56 %
Platelets: 161 10*3/uL (ref 150–400)
RBC: 5.4 MIL/uL (ref 4.22–5.81)
RDW: 12.8 % (ref 11.5–15.5)
WBC: 3.6 10*3/uL — ABNORMAL LOW (ref 4.0–10.5)
nRBC: 0 % (ref 0.0–0.2)

## 2023-04-30 LAB — URINE CULTURE

## 2023-04-30 LAB — BASIC METABOLIC PANEL
Anion gap: 13 (ref 5–15)
BUN: 11 mg/dL (ref 8–23)
CO2: 22 mmol/L (ref 22–32)
Calcium: 8.2 mg/dL — ABNORMAL LOW (ref 8.9–10.3)
Chloride: 97 mmol/L — ABNORMAL LOW (ref 98–111)
Creatinine, Ser: 1.01 mg/dL (ref 0.61–1.24)
GFR, Estimated: >60 mL/min (ref >60)
Glucose, Bld: 200 mg/dL — ABNORMAL HIGH (ref 70–99)
Potassium: 2.8 mmol/L — ABNORMAL LOW (ref 3.5–5.1)
Sodium: 132 mmol/L — ABNORMAL LOW (ref 135–145)

## 2023-04-30 LAB — TROPONIN I (HIGH SENSITIVITY)
Troponin I (High Sensitivity): 17 ng/L (ref <18)
Troponin I (High Sensitivity): 18 ng/L — ABNORMAL HIGH (ref <18)

## 2023-04-30 LAB — MAGNESIUM: Magnesium: 1.8 mg/dL (ref 1.7–2.4)

## 2023-04-30 MED ORDER — APIXABAN 5 MG PO TABS
5.0000 mg | ORAL_TABLET | Freq: Two times a day (BID) | ORAL | Status: DC
  Administered 2023-04-30 – 2023-05-02 (×5): 5 mg via ORAL
  Filled 2023-04-30 (×5): qty 1

## 2023-04-30 MED ORDER — POTASSIUM CHLORIDE 10 MEQ/100ML IV SOLN
10.0000 meq | INTRAVENOUS | Status: AC
  Administered 2023-04-30 (×6): 10 meq via INTRAVENOUS
  Filled 2023-04-30 (×6): qty 100

## 2023-04-30 MED ORDER — CEFAZOLIN SODIUM-DEXTROSE 1-4 GM/50ML-% IV SOLN
1.0000 g | Freq: Three times a day (TID) | INTRAVENOUS | Status: DC
  Administered 2023-04-30 – 2023-05-01 (×4): 1 g via INTRAVENOUS
  Filled 2023-04-30 (×4): qty 50

## 2023-04-30 MED ORDER — LABETALOL HCL 5 MG/ML IV SOLN: 5.0000 mg | Freq: Once | INTRAVENOUS | Status: DC

## 2023-04-30 MED ORDER — METOPROLOL TARTRATE 25 MG PO TABS
25.0000 mg | ORAL_TABLET | Freq: Two times a day (BID) | ORAL | Status: DC
  Administered 2023-04-30 – 2023-05-02 (×5): 25 mg via ORAL
  Filled 2023-04-30 (×5): qty 1

## 2023-04-30 MED ORDER — MAGNESIUM SULFATE 2 GM/50ML IV SOLN
2.0000 g | Freq: Once | INTRAVENOUS | Status: AC
  Administered 2023-04-30: 2 g via INTRAVENOUS
  Filled 2023-04-30: qty 50

## 2023-04-30 MED ORDER — METOPROLOL TARTRATE 5 MG/5ML IV SOLN
5.0000 mg | Freq: Once | INTRAVENOUS | Status: AC
  Administered 2023-04-30: 5 mg via INTRAVENOUS
  Filled 2023-04-30: qty 5

## 2023-04-30 MED ORDER — SODIUM CHLORIDE 0.9 % IV SOLN: 1.0000 g | INTRAVENOUS | Status: DC

## 2023-04-30 NOTE — Assessment & Plan Note (Signed)
Likely incidental and not of concern to current presentation.  RMSF antibodies still pending, though suspicion remains low.  Reticulocyte count appropriate.  Blood smear with giant platelets that are of unknown significance. -RMSF ab follow-up

## 2023-04-30 NOTE — Discharge Instructions (Addendum)
Dear Ryan Clarke,   Thank you for letting us participate in your care! In this section, you will find a brief hospital admission summary of why you were admitted to the hospital, what happened during your admission, your diagnosis/diagnoses, and recommended follow up.  Primary diagnosis: Pyelonephritis Treatment plan: You received antibiotics and were sent home with the remaining course Secondary diagnosis: Electrolyte abnormalities Treatment plan: He was started on amiloride so that you are able to hold potassium better  POST-HOSPITAL & CARE INSTRUCTIONS We recommend following up with your PCP within 1 week from being discharged from the hospital.  This is scheduled  DOCTOR'S APPOINTMENTS & FOLLOW UP Future Appointments  Date Time Provider Department Center  05/05/2023  3:10 PM Littie Deeds, MD [facility]  06/01/2023  7:25 AM [facility] DEVICE REMOTES [facility]  07/03/2023  8:20 AM Jodelle Red, MD [facility]  08/31/2023  7:25 AM [facility] DEVICE REMOTES [facility]  11/30/2023  7:25 AM [facility] DEVICE REMOTES [facility]  02/29/2024  7:25 AM [facility] DEVICE REMOTES [facility]  05/30/2024  7:25 AM [facility] DEVICE REMOTES [facility]  08/29/2024  7:25 AM [facility] DEVICE REMOTES [facility]     Thank you for choosing [facility]! Take care and be well!  Family Medicine Teaching Service Inpatient Team [place]  [facility]  [address] [place], [place] [phone]   Information on my medicine - ELIQUIS (apixaban)  This medication education was reviewed with me or my healthcare representative as part of my discharge preparation.    Why was Eliquis prescribed for you? Eliquis was prescribed for you to reduce the risk of a blood clot forming that can cause a stroke if you have a medical condition called atrial fibrillation (a type of  irregular heartbeat).  What do You need to know about Eliquis ? Take your Eliquis TWICE DAILY - one tablet in the morning and one tablet in the evening with or without food. If you have difficulty swallowing the tablet whole please discuss with your pharmacist how to take the medication safely.  Take Eliquis exactly as prescribed by your doctor and DO NOT stop taking Eliquis without talking to the doctor who prescribed the medication.  Stopping may increase your risk of developing a stroke.  Refill your prescription before you run out.  After discharge, you should have regular check-up appointments with your healthcare provider that is prescribing your Eliquis.  In the future your dose may need to be changed if your kidney function or weight changes by a significant amount or as you get older.  What do you do if you miss a dose? If you miss a dose, take it as soon as you remember on the same day and resume taking twice daily.  Do not take more than one dose of ELIQUIS at the same time to make up a missed dose.  Important Safety Information A possible side effect of Eliquis is bleeding. You should call your healthcare provider right away if you experience any of the following: Bleeding from an injury or your nose that does not stop. Unusual colored urine (red or dark brown) or unusual colored stools (red or black). Unusual bruising for unknown reasons. A serious fall or if you hit your head (even if there is no bleeding).  Some medicines may interact with Eliquis and might increase your risk of bleeding or clotting while on Eliquis. To help avoid this, consult your healthcare provider or pharmacist prior  to using any new prescription or non-prescription medications, including herbals, vitamins, non-steroidal anti-inflammatory drugs (NSAIDs) and supplements.  This website has more information on Eliquis (apixaban): http://www.eliquis.com/eliquis/home

## 2023-04-30 NOTE — Assessment & Plan Note (Addendum)
Potassium and magnesium remain lower this AM after multiple rounds of supplementation. Unclear cause; do not suspect GI losses or decreased PO intake as culprit. Potentially secondary to infection versus intrinsic renal disease. Also consider secondary to diuretic use; however, he has not been on this in the hospital. RUQ [place] negative making anatomic disease less likely. -Continue supplementation as needed -Trend BMP, Mg -24-hour urine potassium unfortunately erroneously collected; will proceed with spot sodium, potassium, creatinine urine sample per nephrology -Consulted nephro, appreciate recs

## 2023-04-30 NOTE — Plan of Care (Signed)
  Problem: Activity: Goal: Risk for activity intolerance will decrease Outcome: Progressing   Problem: Nutrition: Goal: Adequate nutrition will be maintained Outcome: Progressing   Problem: Elimination: Goal: Will not experience complications related to urinary retention Outcome: Progressing   

## 2023-04-30 NOTE — Assessment & Plan Note (Addendum)
Now rate controlled. BP WNL. Recurrence in hospital likely in setting of holding metoprolol due to low BP as well as effects of current infection.  Remained hemodynamically stable and feeling much better now status post IV metoprolol.  Chest x-ray negative.  Trops negative. -Home p.o. metoprolol dosing -Watch vitals

## 2023-04-30 NOTE — Progress Notes (Addendum)
Daily Progress Note Intern Pager: [phone]  Patient name: Ryan Clarke Medical record number: [id number] Date of birth: 05-May-1956 Age: 67 y.o. Gender: male  Primary Care Provider: Littie Deeds, MD Consultants: None Code Status: Full   Pt Overview and Major Events to Date:  6/11-admitted 6/13-flipped into A-fib   Assessment and Plan:   Ryan Clarke is a 67 year old male presenting with dysuria and fever for 3 days found to have likely pyelonephritis. Pertinent PMH/PSH includes UTI with E. 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  * Pyelonephritis Mildly febrile overnight to 100.5.  UA with small leukocytes, 30 protein, rare bacteria.  Urine, blood culture pending.  Creatinine bumped to 1.23.  Consider BPH as cause of postrenal obstruction causing pyelonephritis given report of urinary urgency with dribbling. - S/p 2 doses of Rocephin, will transition to cefadroxil 500 mg twice daily for 4 more days - monitor vitals per floor  - blood, urine culture - A.m. CMP - monitor fever curve - Tylenol prn for pain or fever   Electrolyte abnormality Potassium and magnesium remain lower this AM after multiple rounds of supplementation. Unclear cause; do not suspect GI losses or decreased PO intake as culprit. Potentially secondary to infection versus intrinsic renal disease. Also consider secondary to diuretic use; however, he has not been on this in the hospital.  -Continue supplementation as needed -Trend BMP, Mg -Obtain 24-hour urine potassium and then consult nephrology -Renal ultrasound  Bug bite Likely incidental and not of concern to current presentation.  Ryan Clarke antibodies still pending, though suspicion remains low.  Reticulocyte count appropriate.  Blood smear with giant platelets that are of unknown significance. 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  Paroxysmal atrial fibrillation (HCC) EKG with  confirmation.  Recurrence likely in setting of holding metoprolol due to low BP as well as effects of current infection.  Remained hemodynamically stable and feeling much better now status post IV metoprolol.  Chest x-ray negative.  Trops negative. -Return back to home p.o. metoprolol dosing -Watch vitals   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  Hypertension - BP soft holding Metoprolol and Clorthalidone  GERD - continue Protonix                   FEN/GI: Heart healthy diet PPx: Transition to Eliquis Dispo: Pending clinical improvement  Subjective:  Had episode this AM of afib with RVR that came on suddenly. He was having sweats and chest pain with this. He states he was feeling just fine until this came on suddenly.  Objective: Temp:  [97.9 F (36.6 C)-100.3 F (37.9 C)] 97.9 F (36.6 C) (06/13 0851) Pulse Rate:  [87-110] 110 (06/13 0851) Resp:  [18-19] 19 (06/13 0624) BP: (105-116)/(58-86) 116/80 (06/13 0851) SpO2:  [93 %-98 %] 94 % (06/13 0851) Physical Exam: General: Alert and oriented, sweating Skin: Diaphoretic HEENT: NCAT, EOM grossly normal, midline nasal septum Cardiac: Irregular rate and rhythm, no m/r/g appreciated Respiratory: CTAB, breathing and speaking comfortably on RA Extremities: Moves all extremities grossly equally Neurological: No gross focal deficit Psychiatric: Anxious mood  Laboratory: Most recent CBC Lab Results  Component Value Date   WBC 3.6 (L) 04/30/2023   HGB 15.8 04/30/2023   HCT 46.2 04/30/2023   MCV 85.6 04/30/2023   PLT 161 04/30/2023   Most recent BMP    Latest Ref Rng & Units 04/30/2023    8:43 AM  BMP  Glucose 70 - 99 mg/dL 161   BUN 8 - 23 mg/dL 11   Creatinine 0.96 - 1.24 mg/dL 0.45   Sodium 409 - 811 mmol/L 132   Potassium 3.5 - 5.1 mmol/L 2.8   Chloride 98 - 111 mmol/L 97   CO2 22 - 32 mmol/L 22   Calcium 8.9 - 10.3 mg/dL 8.2    Mag 1.8 Trop 17  Imaging/Diagnostic  Tests: Chest x-ray IMPRESSION: Stable chest. No evidence of acute cardiopulmonary process. Pacemaker leads appear unchanged.  Janeal Holmes, MD 04/30/2023, 12:15 PM PGY-1, [facility] Family Medicine FPTS Intern pager: [phone], text pages welcome Secure chat group [facility] Teaching Service

## 2023-04-30 NOTE — TOC Benefit Eligibility Note (Signed)
Pharmacy Patient Advocate Encounter  Insurance verification completed.    The patient is insured through [facility]    Ran test claim for Eliquis and the current 30 day co-pay is $45.00.  Ran test claim for Pradaxa and the current 30 day co-pay is $95.00.   This test claim was processed through [facility]- copay amounts may vary at other pharmacies due to pharmacy/plan contracts, or as the patient moves through the different stages of their insurance plan.

## 2023-04-30 NOTE — Assessment & Plan Note (Signed)
-   Continue to monitor clinically

## 2023-05-01 ENCOUNTER — Inpatient Hospital Stay (HOSPITAL_COMMUNITY): Payer: Medicare HMO

## 2023-05-01 DIAGNOSIS — R0609 Other forms of dyspnea: Secondary | ICD-10-CM

## 2023-05-01 LAB — NA AND K (SODIUM & POTASSIUM), RAND UR
Potassium Urine: 45 mmol/L
Sodium, Ur: 45 mmol/L

## 2023-05-01 LAB — MAGNESIUM: Magnesium: 1.8 mg/dL (ref 1.7–2.4)

## 2023-05-01 LAB — CBC
HCT: 44.1 % (ref 39.0–52.0)
Hemoglobin: 14.8 g/dL (ref 13.0–17.0)
MCH: 29 pg (ref 26.0–34.0)
MCHC: 33.6 g/dL (ref 30.0–36.0)
MCV: 86.5 fL (ref 80.0–100.0)
Platelets: 184 10*3/uL (ref 150–400)
RBC: 5.1 MIL/uL (ref 4.22–5.81)
RDW: 12.9 % (ref 11.5–15.5)
WBC: 4.3 10*3/uL (ref 4.0–10.5)
nRBC: 0 % (ref 0.0–0.2)

## 2023-05-01 LAB — ECHOCARDIOGRAM COMPLETE
Area-P 1/2: 3.72 cm2
Height: 73 in
S' Lateral: 4 cm
Weight: 4525.6 oz

## 2023-05-01 LAB — COMPREHENSIVE METABOLIC PANEL
ALT: 65 U/L — ABNORMAL HIGH (ref 0–44)
AST: 74 U/L — ABNORMAL HIGH (ref 15–41)
Albumin: 2.7 g/dL — ABNORMAL LOW (ref 3.5–5.0)
Alkaline Phosphatase: 51 U/L (ref 38–126)
Anion gap: 9 (ref 5–15)
BUN: 14 mg/dL (ref 8–23)
CO2: 27 mmol/L (ref 22–32)
Calcium: 8.4 mg/dL — ABNORMAL LOW (ref 8.9–10.3)
Chloride: 99 mmol/L (ref 98–111)
Creatinine, Ser: 0.96 mg/dL (ref 0.61–1.24)
GFR, Estimated: >60 mL/min (ref >60)
Glucose, Bld: 177 mg/dL — ABNORMAL HIGH (ref 70–99)
Potassium: 3.1 mmol/L — ABNORMAL LOW (ref 3.5–5.1)
Sodium: 135 mmol/L (ref 135–145)
Total Bilirubin: 1.2 mg/dL (ref 0.3–1.2)
Total Protein: 5.9 g/dL — ABNORMAL LOW (ref 6.5–8.1)

## 2023-05-01 LAB — CREATININE, URINE, RANDOM: Creatinine, Urine: 122 mg/dL

## 2023-05-01 MED ORDER — POTASSIUM CHLORIDE CRYS ER 20 MEQ PO TBCR
40.0000 meq | EXTENDED_RELEASE_TABLET | Freq: Once | ORAL | Status: AC
  Administered 2023-05-01: 40 meq via ORAL
  Filled 2023-05-01: qty 2

## 2023-05-01 MED ORDER — MAGNESIUM SULFATE 2 GM/50ML IV SOLN
2.0000 g | Freq: Once | INTRAVENOUS | Status: AC
  Administered 2023-05-01: 2 g via INTRAVENOUS
  Filled 2023-05-01: qty 50

## 2023-05-01 MED ORDER — CEFADROXIL 500 MG PO CAPS
500.0000 mg | ORAL_CAPSULE | Freq: Two times a day (BID) | ORAL | Status: DC
  Administered 2023-05-01 – 2023-05-02 (×2): 500 mg via ORAL
  Filled 2023-05-01 (×2): qty 1

## 2023-05-01 MED ORDER — POTASSIUM CHLORIDE 10 MEQ/100ML IV SOLN
10.0000 meq | INTRAVENOUS | Status: AC
  Administered 2023-05-01 (×4): 10 meq via INTRAVENOUS
  Filled 2023-05-01 (×4): qty 100

## 2023-05-01 NOTE — Assessment & Plan Note (Signed)
Afebrile ON.  UA with small leukocytes, 30 protein, rare bacteria.  Urine culture presentation in clinic with E. coli greater than 100,000 units, he was given Rocephin at that time, urine culture here in hospital less than 10,000 CFU likely status post treatment with Rocephin. - S/p 2 doses of Rocephin, continue cefadroxil 500 mg twice daily  - monitor vitals per floor  - blood, urine culture - A.m. CMP - monitor fever curve - Tylenol prn for pain or fever

## 2023-05-01 NOTE — Progress Notes (Signed)
Daily Progress Note Intern Pager: [phone]  Patient name: Ryan Clarke Medical record number: [id number] Date of birth: 1956/02/29 Age: 67 y.o. Gender: male  Primary Care Provider: Littie Deeds, MD Consultants: None Code Status: Full   Pt Overview and Major Events to Date:  6/11-admitted 6/13-flipped into A-fib   Assessment and Plan:   Semaja Clarke is a 67 year old male presenting with dysuria and fever for 3 days found to have likely pyelonephritis. Pertinent PMH/PSH includes UTI with E. 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  Hospital Problem List      Hospital     * (Principal) Pyelonephritis     Afebrile ON.  UA with small leukocytes, 30 protein, rare bacteria.   Urine culture presentation in clinic with E. coli greater than 100,000  units, he was given Rocephin at that time, urine culture here in hospital  less than 10,000 CFU likely status post treatment with Rocephin. - S/p 2 doses of Rocephin, continue cefadroxil 500 mg twice daily  - monitor vitals per floor  - blood, urine culture - A.m. CMP - monitor fever curve - Tylenol prn for pain or fever         Paroxysmal atrial fibrillation (HCC)     Now rate controlled. BP WNL. Recurrence in hospital likely in setting  of holding metoprolol due to low BP as well as effects of current  infection.  Remained hemodynamically stable and feeling much better now  status post IV metoprolol.  Chest x-ray negative.  Trops negative. -Home p.o. metoprolol dosing -Watch vitals        [place] syndrome     Continue to monitor clinically.        Bug bite     Likely incidental and not of concern to current presentation.  RMSF  antibodies still pending, though suspicion remains low.  Reticulocyte  count appropriate.  Blood smear with giant platelets that are of unknown  significance. -RMSF ab follow-up        Electrolyte abnormality     Potassium and magnesium remain lower this AM  after multiple rounds of  supplementation. Unclear cause; do not suspect GI losses or decreased PO  intake as culprit. Potentially secondary to infection versus intrinsic  renal disease. Also consider secondary to diuretic use; however, he has  not been on this in the hospital. RUQ [place] negative making anatomic disease  less likely. -Continue supplementation as needed -Trend BMP, Mg -24-hour urine potassium unfortunately erroneously collected; will proceed  with spot sodium, potassium, creatinine urine sample per nephrology -Consulted nephro, appreciate recs      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  Hypertension - hold chlorthalidone GERD - continue Protonix                   FEN/GI: Heart healthy diet PPx: Transition to Eliquis Dispo: Pending clinical improvement  Subjective:  Doing well this morning without complaints.  Objective: Temp:  [98 F (36.7 C)-98.6 F (37 C)] 98.6 F (37 C) (06/14 0848) Pulse Rate:  [71-87] 71 (06/14 0848) Resp:  [18-23] 18 (06/14 0848) BP: (107-125)/(65-93) 110/65 (06/14 0848) SpO2:  [95 %-98 %] 97 % (06/14 0848) Weight:  [128.3 kg] 128.3 kg (06/13 1900) Physical Exam: General: Sitting up in the chair without distress Cardiovascular: Regular rate and rhythm without murmurs rubs or gallops Respiratory: Clear to auscultation bilaterally without wheezes rales or rhonchi Abdomen: Nondistended, normoactive bowel  sounds Extremities: Moves all extremities grossly equally in bed  Laboratory: Most recent CBC Lab Results  Component Value Date   WBC 4.3 05/01/2023   HGB 14.8 05/01/2023   HCT 44.1 05/01/2023   MCV 86.5 05/01/2023   PLT 184 05/01/2023   Most recent BMP    Latest Ref Rng & Units 05/01/2023    2:07 AM  BMP  Glucose 70 - 99 mg/dL 409   BUN 8 - 23 mg/dL 14   Creatinine 8.11 - 1.24 mg/dL 9.14   Sodium 782 - 956 mmol/L 135   Potassium 3.5 - 5.1 mmol/L 3.1   Chloride 98 - 111  mmol/L 99   CO2 22 - 32 mmol/L 27   Calcium 8.9 - 10.3 mg/dL 8.4    Magnesium 1.8  Imaging/Diagnostic Tests: Renal ultrasound IMPRESSION: No collecting system dilatation.  [name], MD 05/01/2023, 2:27 PM PGY-1, [facility] Family Medicine FPTS Intern pager: [phone], text pages welcome Secure chat group [facility] Teaching Service

## 2023-05-01 NOTE — Consult Note (Signed)
[facility] Nephrology Consultation Note  Requesting MD: Janeal Holmes  Reason for consult: Concern for electrolyte derangement   HPI:  Ryan Clarke is a 67 y.o. male with PMHx of Gilbert's syndrome, afib p/f electrolyte derangement. Patient presented initially from [facility] for concern of pyelonephritis with Ucx E. Coli >100K CFU. At that time, had dysuria, fever, nausea persistent despite Rocephin and Bactrim outpatient. On admission, had hypokalemia to 2.2 and received several doses of potassium IV, Mag also low at 1.4.   K notable to be low on 04/17/23 and has remained low to this point despite repletion. K 2.6>3.1>2.8>3.1. Mag 1.4 on 04/29/23 but resolved on 6/13 with repletion. K low despite aggressive repletion. Renal U/S unremarkable. 24 hr urine Na and K pending today, needs to be obtained. UOP documented of daily running total. UA on admission 30 protein, small leuks, rare bacteria.   Of note, had a run of Afib with RVR yesterday to 150s, was given IV metoprolol and restarted home metoprolol, BP tolerated. Able to rate control with metoprolol and is on Eliquis, previously was not taking eliquis   Home medication Chlorthalidone documented, and he was taking. No supplements or alcohol use.   No diarrhea or vomiting, voiding appropriately without pain. No concern for respiratory distress or volume overload.    Creatinine, Ser  Date/Time Value Ref Range Status  05/01/2023 02:07 AM 0.96 0.61 - 1.24 mg/dL Final  16/08/9603 54:09 AM 1.01 0.61 - 1.24 mg/dL Final  81/19/1478 29:56 PM 1.08 0.61 - 1.24 mg/dL Final  21/30/8657 84:69 AM 1.01 0.61 - 1.24 mg/dL Final  62/95/2841 32:44 AM 1.23 0.61 - 1.24 mg/dL Final  11/19/7251 66:44 PM 1.18 0.61 - 1.24 mg/dL Final  03/47/4259 56:38 AM 1.09 0.61 - 1.24 mg/dL Final  75/64/3329 51:88 PM 1.06 0.76 - 1.27 mg/dL Final  41/66/0630 16:01 AM 1.06 0.76 - 1.27 mg/dL Final  09/32/3557 32:20 AM 1.04 0.76 - 1.27 mg/dL Final  25/42/7062  37:62 AM 1.07 0.76 - 1.27 mg/dL Final  83/15/1761 60:73 AM 1.04 0.76 - 1.27 mg/dL Final  71/04/2693 85:46 AM 0.85 0.61 - 1.24 mg/dL Final  27/01/5008 38:18 PM 0.79 0.61 - 1.24 mg/dL Final  29/93/7169 67:89 AM 0.79 0.61 - 1.24 mg/dL Final  38/08/1750 02:58 AM 0.77 0.61 - 1.24 mg/dL Final  52/77/8242 35:36 PM 0.80 0.61 - 1.24 mg/dL Final  14/43/1540 08:67 PM 0.60 (L) 0.61 - 1.24 mg/dL Final  61/95/0932 67:12 PM 0.70 0.61 - 1.24 mg/dL Final  45/80/9983 38:25 AM 0.70 0.61 - 1.24 mg/dL Final  05/39/7673 41:93 AM 0.60 (L) 0.61 - 1.24 mg/dL Final  79/12/4095 35:32 AM 0.80 0.61 - 1.24 mg/dL Final  99/24/2683 41:96 AM 0.70 0.61 - 1.24 mg/dL Final  22/29/7989 21:19 AM 0.86 0.61 - 1.24 mg/dL Final  41/74/0814 48:18 PM 0.85 0.61 - 1.24 mg/dL Final  56/31/4970 26:37 AM 0.80 0.61 - 1.24 mg/dL Final  85/88/5027 74:12 AM 0.78 0.61 - 1.24 mg/dL Final  87/86/7672 09:47 AM 0.78 0.61 - 1.24 mg/dL Final  09/62/8366 29:47 PM 0.88 0.61 - 1.24 mg/dL Final  65/46/5035 46:56 AM 0.87 0.61 - 1.24 mg/dL Final  81/27/5170 01:74 PM 0.74 (L) 0.76 - 1.27 mg/dL Final  94/49/6759 16:38 AM 0.78 0.61 - 1.24 mg/dL Final     PMHx:   Past Medical History:  Diagnosis Date   Carpal tunnel syndrome 09/10/2020   Coronary artery disease    Hypertension    Nodular basal cell carcinoma (BCC) 09/04/2021   Left Lower Back  Past Surgical History:  Procedure Laterality Date   CORONARY ARTERY BYPASS GRAFT N/A 11/26/2020   Procedure: CORONARY ARTERY BYPASS GRAFTING (CABG) TIMES TWO USING BILATERAL INTERNAL MAMMARY ARTERIES;  Surgeon: Alleen Borne, MD;  Location: [facility] OR;  Service: Open Heart Surgery;  Laterality: N/A;  BIMA   CORONARY BALLOON ANGIOPLASTY N/A 09/14/2020   Procedure: CORONARY BALLOON ANGIOPLASTY;  Surgeon: Lennette Bihari, MD;  Location: [facility] INVASIVE CV LAB;  Service: Cardiovascular;  Laterality: N/A;   CORONARY STENT PLACEMENT     LEFT HEART CATH AND CORONARY ANGIOGRAPHY N/A 09/14/2020   Procedure: LEFT  HEART CATH AND CORONARY ANGIOGRAPHY;  Surgeon: Lennette Bihari, MD;  Location: [facility] INVASIVE CV LAB;  Service: Cardiovascular;  Laterality: N/A;   LEFT HEART CATH AND CORONARY ANGIOGRAPHY N/A 11/19/2020   Procedure: LEFT HEART CATH AND CORONARY ANGIOGRAPHY;  Surgeon: Lennette Bihari, MD;  Location: [facility] INVASIVE CV LAB;  Service: Cardiovascular;  Laterality: N/A;   PACEMAKER INSERTION     TEE WITHOUT CARDIOVERSION N/A 11/26/2020   Procedure: TRANSESOPHAGEAL ECHOCARDIOGRAM (TEE);  Surgeon: Alleen Borne, MD;  Location: [facility] OR;  Service: Open Heart Surgery;  Laterality: N/A;    Family Hx:  Family History  Problem Relation Age of Onset   Coronary artery disease Mother    Coronary artery disease Father    Coronary artery disease Brother    Coronary artery disease Brother    Liver cancer Maternal Grandmother     Social History:  reports that he has never smoked. He has never used smokeless tobacco. He reports that he does not drink alcohol and does not use drugs.  Allergies: No Known Allergies  Medications: Prior to Admission medications   Medication Sig Start Date End Date Taking? Authorizing Provider  acetaminophen (TYLENOL) 500 MG tablet Take 1,000 mg by mouth every 4 (four) hours.   Yes [provider]  atorvastatin (LIPITOR) 80 MG tablet TAKE 1 TABLET BY MOUTH EVERYDAY AT BEDTIME Patient taking differently: Take 80 mg by mouth at bedtime. 04/17/23  Yes Jodelle Red, MD  chlorthalidone (HYGROTON) 25 MG tablet TAKE 1 TABLET (25 MG TOTAL) BY MOUTH DAILY. 12/01/22 11/26/23 Yes Jodelle Red, MD  metoprolol tartrate (LOPRESSOR) 25 MG tablet TAKE 1 TABLET BY MOUTH TWICE A DAY Patient taking differently: Take 25 mg by mouth 2 (two) times daily. 10/15/22  Yes Jodelle Red, MD  pantoprazole (PROTONIX) 40 MG tablet TAKE 1 TABLET BY MOUTH EVERY DAY Patient taking differently: Take 40 mg by mouth daily. 03/11/23  Yes Littie Deeds, MD  prasugrel (EFFIENT) 10 MG TABS  tablet TAKE 1 TABLET BY MOUTH EVERY DAY Patient taking differently: Take 10 mg by mouth daily. 12/01/22  Yes Jodelle Red, MD  sulfamethoxazole-trimethoprim (BACTRIM DS) 800-160 MG tablet Take 1 tablet by mouth 2 (two) times daily for 14 days. 04/27/23 05/11/23 Yes Lilland, Alana, DO  ondansetron (ZOFRAN-ODT) 4 MG disintegrating tablet Take 1 tablet (4 mg total) by mouth every 8 (eight) hours as needed for nausea or vomiting. Patient not taking: Reported on 04/28/2023 04/27/23   Evelena Leyden, DO    I have reviewed the patient's current medications.  Labs:  Results for orders placed or performed during the hospital encounter of 04/28/23 (from the past 48 hour(s))  Basic metabolic panel     Status: Abnormal   Collection Time: 04/29/23 10:46 AM  Result Value Ref Range   Sodium 131 (L) 135 - 145 mmol/L   Potassium 2.6 (LL) 3.5 - 5.1 mmol/L  Comment: CRITICAL RESULT CALLED TO, READ BACK BY AND VERIFIED WITH ERIC JUNG,RN AT 1255 04/29/2023 BY ZBEECH.   Chloride 93 (L) 98 - 111 mmol/L   CO2 26 22 - 32 mmol/L   Glucose, Bld 157 (H) 70 - 99 mg/dL    Comment: Glucose reference range applies only to samples taken after fasting for at least 8 hours.   BUN 10 8 - 23 mg/dL   Creatinine, Ser 1.61 0.61 - 1.24 mg/dL   Calcium 8.3 (L) 8.9 - 10.3 mg/dL   GFR, Estimated >09 >60 mL/min    Comment: (NOTE) Calculated using the CKD-EPI Creatinine Equation (2021)    Anion gap 12 5 - 15    Comment: Performed at [facility], [address], [place], [place] [address]  Magnesium     Status: None   Collection Time: 04/29/23 10:46 AM  Result Value Ref Range   Magnesium 2.2 1.7 - 2.4 mg/dL    Comment: Performed at [facility], [address], [place], [place] [address]  Technologist smear review     Status: None   Collection Time: 04/29/23 10:46 AM  Result Value Ref Range   WBC MORPHOLOGY MORPHOLOGY UNREMARKABLE    RBC MORPHOLOGY MORPHOLOGY UNREMARKABLE    Plt Morphology GIANT  PLATELETS SEEN     Comment: PLATELETS APPEAR ADEQUATE   Clinical Information      abdominal pain, fever, ?joint pain, mild hyponatremia, mild hyperbilirubinemia in setting of bug bite with potential for RMSF    Comment: Performed at [facility], [address] [place], [place] [address]  Differential     Status: Abnormal   Collection Time: 04/29/23 10:46 AM  Result Value Ref Range   Neutrophils Relative % 83 %   Neutro Abs 4.3 1.7 - 7.7 K/uL   Lymphocytes Relative 9 %   Lymphs Abs 0.4 (L) 0.7 - 4.0 K/uL   Monocytes Relative 6 %   Monocytes Absolute 0.3 0.1 - 1.0 K/uL   Eosinophils Relative 0 %   Eosinophils Absolute 0.0 0.0 - 0.5 K/uL   Basophils Relative 1 %   Basophils Absolute 0.0 0.0 - 0.1 K/uL   Immature Granulocytes 1 %   Abs Immature Granulocytes 0.03 0.00 - 0.07 K/uL    Comment: Performed at [facility], [address], [place], [place] [address]  CBC     Status: None   Collection Time: 04/29/23 10:46 AM  Result Value Ref Range   WBC 5.1 4.0 - 10.5 K/uL   RBC 5.43 4.22 - 5.81 MIL/uL   Hemoglobin 16.0 13.0 - 17.0 g/dL   HCT 08.6 57.8 - 46.9 %   MCV 86.4 80.0 - 100.0 fL   MCH 29.5 26.0 - 34.0 pg   MCHC 34.1 30.0 - 36.0 g/dL   RDW 62.9 52.8 - 41.3 %   Platelets 150 150 - 400 K/uL   nRBC 0.0 0.0 - 0.2 %    Comment: Performed at [facility], [address], [place], [place] [address]  Basic metabolic panel     Status: Abnormal   Collection Time: 04/29/23  6:22 PM  Result Value Ref Range   Sodium 131 (L) 135 - 145 mmol/L   Potassium 3.1 (L) 3.5 - 5.1 mmol/L   Chloride 96 (L) 98 - 111 mmol/L   CO2 25 22 - 32 mmol/L   Glucose, Bld 210 (H) 70 - 99 mg/dL    Comment: Glucose reference range applies only  to samples taken after fasting for at least 8 hours.   BUN 12 8 - 23 mg/dL   Creatinine, Ser 1.61 0.61 - 1.24 mg/dL   Calcium 7.8 (L) 8.9 - 10.3 mg/dL   GFR, Estimated >09 >60 mL/min    Comment: (NOTE) Calculated using the CKD-EPI Creatinine Equation  (2021)    Anion gap 10 5 - 15    Comment: Performed at [facility], [address], [place], [place] [address]  Troponin I (High Sensitivity)     Status: None   Collection Time: 04/30/23  8:43 AM  Result Value Ref Range   Troponin I (High Sensitivity) 17 <18 ng/L    Comment: (NOTE) Elevated high sensitivity troponin I (hsTnI) values and significant  changes across serial measurements may suggest ACS but many other  chronic and acute conditions are known to elevate hsTnI results.  Refer to the "Links" section for chest pain algorithms and additional  guidance. Performed at [facility], [address], [place], [place] [address]   Basic metabolic panel     Status: Abnormal   Collection Time: 04/30/23  8:43 AM  Result Value Ref Range   Sodium 132 (L) 135 - 145 mmol/L   Potassium 2.8 (L) 3.5 - 5.1 mmol/L   Chloride 97 (L) 98 - 111 mmol/L   CO2 22 22 - 32 mmol/L   Glucose, Bld 200 (H) 70 - 99 mg/dL    Comment: Glucose reference range applies only to samples taken after fasting for at least 8 hours.   BUN 11 8 - 23 mg/dL   Creatinine, Ser 4.78 0.61 - 1.24 mg/dL   Calcium 8.2 (L) 8.9 - 10.3 mg/dL   GFR, Estimated >29 >56 mL/min    Comment: (NOTE) Calculated using the CKD-EPI Creatinine Equation (2021)    Anion gap 13 5 - 15    Comment: Performed at [facility], [address], [place], [place] [address]  Magnesium     Status: None   Collection Time: 04/30/23  8:43 AM  Result Value Ref Range   Magnesium 1.8 1.7 - 2.4 mg/dL    Comment: Performed at [facility], [address], [place], [place] [address]  CBC with Differential/Platelet     Status: Abnormal   Collection Time: 04/30/23  8:43 AM  Result Value Ref Range   WBC 3.6 (L) 4.0 - 10.5 K/uL   RBC 5.40 4.22 - 5.81 MIL/uL   Hemoglobin 15.8 13.0 - 17.0 g/dL   HCT 69.6 29.5 - 28.4 %   MCV 85.6 80.0 - 100.0 fL   MCH 29.3 26.0 - 34.0 pg   MCHC 34.2 30.0 - 36.0 g/dL   RDW 13.2 44.0 - 10.2 %    Platelets 161 150 - 400 K/uL   nRBC 0.0 0.0 - 0.2 %   Neutrophils Relative % 56 %   Neutro Abs 2.0 1.7 - 7.7 K/uL   Lymphocytes Relative 25 %   Lymphs Abs 0.9 0.7 - 4.0 K/uL   Monocytes Relative 17 %   Monocytes Absolute 0.6 0.1 - 1.0 K/uL   Eosinophils Relative 1 %   Eosinophils Absolute 0.0 0.0 - 0.5 K/uL   Basophils Relative 1 %   Basophils Absolute 0.0 0.0 - 0.1 K/uL   Immature Granulocytes 0 %   Abs Immature Granulocytes 0.01 0.00 - 0.07 K/uL    Comment: Performed at [facility], [address], [place], [place] [address]  Troponin I (  High Sensitivity)     Status: Abnormal   Collection Time: 04/30/23 11:20 AM  Result Value Ref Range   Troponin I (High Sensitivity) 18 (H) <18 ng/L    Comment: (NOTE) Elevated high sensitivity troponin I (hsTnI) values and significant  changes across serial measurements may suggest ACS but many other  chronic and acute conditions are known to elevate hsTnI results.  Refer to the "Links" section for chest pain algorithms and additional  guidance. Performed at [facility], [address], [place], [place] [address]   Comprehensive metabolic panel     Status: Abnormal   Collection Time: 05/01/23  2:07 AM  Result Value Ref Range   Sodium 135 135 - 145 mmol/L   Potassium 3.1 (L) 3.5 - 5.1 mmol/L   Chloride 99 98 - 111 mmol/L   CO2 27 22 - 32 mmol/L   Glucose, Bld 177 (H) 70 - 99 mg/dL    Comment: Glucose reference range applies only to samples taken after fasting for at least 8 hours.   BUN 14 8 - 23 mg/dL   Creatinine, Ser 5.78 0.61 - 1.24 mg/dL   Calcium 8.4 (L) 8.9 - 10.3 mg/dL   Total Protein 5.9 (L) 6.5 - 8.1 g/dL   Albumin 2.7 (L) 3.5 - 5.0 g/dL   AST 74 (H) 15 - 41 U/L   ALT 65 (H) 0 - 44 U/L   Alkaline Phosphatase 51 38 - 126 U/L   Total Bilirubin 1.2 0.3 - 1.2 mg/dL   GFR, Estimated >46 >96 mL/min    Comment: (NOTE) Calculated using the CKD-EPI Creatinine Equation (2021)    Anion gap 9 5 - 15    Comment:  Performed at [facility], [address] [place], [place] [address]  Magnesium     Status: None   Collection Time: 05/01/23  2:07 AM  Result Value Ref Range   Magnesium 1.8 1.7 - 2.4 mg/dL    Comment: Performed at [facility], [address] [place], [place] [address]  CBC     Status: None   Collection Time: 05/01/23  2:07 AM  Result Value Ref Range   WBC 4.3 4.0 - 10.5 K/uL   RBC 5.10 4.22 - 5.81 MIL/uL   Hemoglobin 14.8 13.0 - 17.0 g/dL   HCT 40.1 02.7 - 25.3 %   MCV 86.5 80.0 - 100.0 fL   MCH 29.0 26.0 - 34.0 pg   MCHC 33.6 30.0 - 36.0 g/dL   RDW 66.4 40.3 - 47.4 %   Platelets 184 150 - 400 K/uL   nRBC 0.0 0.0 - 0.2 %    Comment: Performed at [facility], [address] [place], [place] [address]     ROS:  Pertinent items are noted in HPI.  Physical Exam: Vitals:   05/01/23 0514 05/01/23 0848  BP: 107/83 110/65  Pulse: 73 71  Resp: (!) 23 18  Temp: 98 F (36.7 C) 98.6 F (37 C)  SpO2: 98% 97%     General exam: Appears calm and comfortable  Respiratory system: Clear to auscultation. Respiratory effort normal. No wheezing or crackle Cardiovascular system: S1 & S2 heard, RRR.  No pedal edema. Gastrointestinal system: Abdomen is nondistended, soft and nontender. Normal bowel sounds heard. Central nervous system: Alert and oriented. No focal neurological deficits. Extremities: Symmetric 5 x 5 power. Skin: No rashes, lesions or ulcers Psychiatry: Judgement and insight appear normal. Mood & affect appropriate.   Assessment/Plan:  1.Renal  Electrolyte derangement: Cr 0.96, BUN 14, Bicarb 27, GFR nml. Hypokalemia in the setting of hypomagnesemia could be a result of potassium wasting. Denies diarrhea, vomiting at present with normal intake. No diuretics on board. Assess for possibility of poor renal handling of potassium with fractional excretion of potassium, ordered spot urine K and Cr. If >9%, elevated excretion and will need a potassium sparing  diuretic such as amiloride. Otherwise monitor kidney function, no acidosis reassuringly and normal UOP as well as kidney function.  2. Volume  - Euvolemic, holding Chlorthalidone.  3. pAFib - metop and AC per primary  4. Pyelonephritis, cefedroxil 500 BID, doing well     Mera Gunkel 05/01/2023, 10:11 AM  [employer].

## 2023-05-01 NOTE — Progress Notes (Signed)
Initial Nutrition Assessment  NUTRITION DIAGNOSIS:  No nutrition diagnosis.   REASON FOR ASSESSMENT:   Malnutrition Screening Tool    ASSESSMENT:   67 y.o. male admits related to dysuria and fevers. PMH includes: afib, CAD, HTN, GERD. Pt is currently receiving medical management related to pyelonephritis.  Meds reviewed: lipitor. Labs reviewed: K low.   Pt reports that he has a good appetite and has been eating well since admission. He states that he was not eating well for a couple of days upon admission due to infection. He states prior to this he was eating well with no wt loss. Pt is meeting his needs at this time. RD will sign off.   NUTRITION - FOCUSED PHYSICAL EXAM:  WDL - no wasting noted.  Diet Order:   Diet Order             Diet Heart Room service appropriate? Yes; Fluid consistency: Thin  Diet effective now                   EDUCATION NEEDS:      Skin:  Skin Assessment: Reviewed RN Assessment  Last BM:  6/12 - type 5  Height:   Ht Readings from Last 1 Encounters:  04/30/23 6\' 1"  (1.854 m)    Weight:   Wt Readings from Last 1 Encounters:  04/30/23 128.3 kg    Ideal Body Weight:     BMI:  Body mass index is 37.32 kg/m.  Estimated Nutritional Needs:   Kcal:  1900-2300 kcals  Protein:  95-115 gm  Fluid:  1.9-2.5 L  Bethann Humble, RD, LDN, CNSC.

## 2023-05-01 NOTE — Progress Notes (Signed)
  Echocardiogram 2D Echocardiogram has been performed.  Ryan Clarke 05/01/2023, 11:45 AM

## 2023-05-02 ENCOUNTER — Other Ambulatory Visit (HOSPITAL_COMMUNITY): Payer: Self-pay

## 2023-05-02 LAB — BASIC METABOLIC PANEL
Anion gap: 10 (ref 5–15)
BUN: 14 mg/dL (ref 8–23)
CO2: 25 mmol/L (ref 22–32)
Calcium: 8.3 mg/dL — ABNORMAL LOW (ref 8.9–10.3)
Chloride: 101 mmol/L (ref 98–111)
Creatinine, Ser: 0.81 mg/dL (ref 0.61–1.24)
GFR, Estimated: >60 mL/min (ref >60)
Glucose, Bld: 104 mg/dL — ABNORMAL HIGH (ref 70–99)
Potassium: 3.8 mmol/L (ref 3.5–5.1)
Sodium: 136 mmol/L (ref 135–145)

## 2023-05-02 LAB — MAGNESIUM: Magnesium: 1.7 mg/dL (ref 1.7–2.4)

## 2023-05-02 MED ORDER — MAGNESIUM SULFATE 2 GM/50ML IV SOLN
2.0000 g | Freq: Once | INTRAVENOUS | Status: AC
  Administered 2023-05-02: 2 g via INTRAVENOUS
  Filled 2023-05-02: qty 50

## 2023-05-02 MED ORDER — AMILORIDE HCL 5 MG PO TABS
5.0000 mg | ORAL_TABLET | Freq: Every day | ORAL | 0 refills | Status: DC
  Filled 2023-05-02: qty 30, 30d supply, fill #0

## 2023-05-02 MED ORDER — CEFADROXIL 500 MG PO CAPS
500.0000 mg | ORAL_CAPSULE | Freq: Two times a day (BID) | ORAL | 0 refills | Status: AC
  Filled 2023-05-02: qty 3, 2d supply, fill #0

## 2023-05-02 MED ORDER — AMILORIDE HCL 5 MG PO TABS: 5.0000 mg | ORAL_TABLET | Freq: Every day | ORAL | 0 refills | Status: DC

## 2023-05-02 MED ORDER — APIXABAN 5 MG PO TABS
5.0000 mg | ORAL_TABLET | Freq: Two times a day (BID) | ORAL | 0 refills | Status: DC
  Filled 2023-05-02: qty 60, 30d supply, fill #0

## 2023-05-02 MED ORDER — AMILORIDE HCL 5 MG PO TABS
5.0000 mg | ORAL_TABLET | Freq: Every day | ORAL | Status: DC
  Administered 2023-05-02: 5 mg via ORAL
  Filled 2023-05-02: qty 1

## 2023-05-02 MED ORDER — ACETAMINOPHEN 500 MG PO TABS: 1000.0000 mg | ORAL_TABLET | Freq: Four times a day (QID) | ORAL | 0 refills | Status: DC | PRN

## 2023-05-02 NOTE — Progress Notes (Signed)
DISCHARGE NOTE HOME Ryan Clarke to be discharged Home per MD order. Discussed prescriptions and follow up appointments with the patient. Prescriptions given to patient; medication list explained in detail. Patient verbalized understanding.  Skin clean, dry and intact without evidence of skin break down, no evidence of skin tears noted. IV catheter discontinued intact. Site without signs and symptoms of complications. Dressing and pressure applied. Pt denies pain at the site currently. No complaints noted.  Patient free of lines, drains, and wounds.   An After Visit Summary (AVS) was printed and given to the patient. Patient escorted via wheelchair, and discharged home via private auto.  Oda Cogan, LPN

## 2023-05-02 NOTE — Progress Notes (Signed)
Patient ID: Ryan Clarke, male   DOB: October 03, 1956, 67 y.o.   MRN: [id number] [facility] Progress Note   Assessment/ Plan:   1.  Hypokalemia: Persistent and with difficulty replacing even after correction of hypomagnesemia.  Thiazide discontinued at least 3 days prior to his labs; urine studies show a fractional excretion of potassium of around 11% indicating an element of tubular wasting.  Will start amiloride 5 mg daily (avoid thiazide use because of its high propensity for hypomagnesemia and hypokalemia as well as hyponatremia).  Labs this morning show potassium level of 3.8 following efforts at replacement.  Can be discharged home from a renal standpoint with outpatient labs via PCP office in 4 to 5 days to follow potassium.  Does not need renal follow-up. 2.  Pyelonephritis: Clinically better and on oral cefadroxil. 3.  Paroxysmal atrial fibrillation: Continue metoprolol for rate control/blood pressure control as well as apixaban for CVA prophylaxis.  Subjective:   Denies any acute events overnight   Objective:   BP 111/73 (BP Location: Left Arm)   Pulse 73   Temp 97.7 F (36.5 C) (Oral)   Resp 18   Ht 6\' 1"  (1.854 m)   Wt 128.3 kg   SpO2 94%   BMI 37.32 kg/m   Intake/Output Summary (Last 24 hours) at 05/02/2023 0911 Last data filed at 05/02/2023 0800 Gross per 24 hour  Intake 864.99 ml  Output 3050 ml  Net -2185.01 ml   Weight change: 0 kg  Physical Exam: Gen: Comfortably sitting up in recliner, watching television CVS: Pulse regular rhythm, normal rate, S1 and S2 normal Resp: Clear to auscultation bilaterally, no rales/rhonchi Abd: Soft, obese, nontender, bowel sounds normal Ext: Trace pedal edema around ankles  Imaging: ECHOCARDIOGRAM COMPLETE  Result Date: 05/01/2023    ECHOCARDIOGRAM REPORT   Patient Name:   Ryan Clarke Date of Exam: 05/01/2023 Medical Rec #:  [id number]    Height:       73.0 in Accession #:    [id number]   Weight:       282.8 lb Date of  Birth:  21-Mar-1956    BSA:          2.493 m Patient Age:    67 years     BP:           110/65 mmHg Patient Gender: M            HR:           70 bpm. Exam Location:  Inpatient Procedure: 2D Echo, 3D Echo, Cardiac Doppler and Color Doppler Indications:    Dyspnea R06.00  History:        Patient has prior history of Echocardiogram examinations, most                 recent 11/19/2020. CAD, Arrythmias:Atrial Fibrillation,                 Signs/Symptoms:Dyspnea; Risk Factors:Non-Smoker and                 Hypertension.  Sonographer:    Aron Baba Referring Phys: [id number] SARA L NEAL  Sonographer Comments: Suboptimal parasternal window and no subcostal window. IMPRESSIONS  1. Left ventricular ejection fraction, by estimation, is 55 to 60%. The left ventricle has normal function. The left ventricle has no regional wall motion abnormalities. Left ventricular diastolic parameters were normal.  2. Right ventricular systolic function is normal. The right ventricular size is normal.  3. Left atrial size was moderately  dilated.  4. The mitral valve is abnormal. Trivial mitral valve regurgitation. No evidence of mitral stenosis.  5. The aortic valve is tricuspid. There is mild calcification of the aortic valve. Aortic valve regurgitation is not visualized. Aortic valve sclerosis is present, with no evidence of aortic valve stenosis.  6. Aortic dilatation noted. There is mild dilatation of the aortic root, measuring 38 mm.  7. The inferior vena cava is normal in size with greater than 50% respiratory variability, suggesting right atrial pressure of 3 mmHg. FINDINGS  Left Ventricle: Left ventricular ejection fraction, by estimation, is 55 to 60%. The left ventricle has normal function. The left ventricle has no regional wall motion abnormalities. The left ventricular internal cavity size was normal in size. There is  no left ventricular hypertrophy. Left ventricular diastolic parameters were normal. Right Ventricle: The right  ventricular size is normal. No increase in right ventricular wall thickness. Right ventricular systolic function is normal. Left Atrium: Left atrial size was moderately dilated. Right Atrium: Right atrial size was normal in size. Pericardium: There is no evidence of pericardial effusion. Mitral Valve: The mitral valve is abnormal. There is mild thickening of the mitral valve leaflet(s). There is mild calcification of the mitral valve leaflet(s). Trivial mitral valve regurgitation. No evidence of mitral valve stenosis. Tricuspid Valve: The tricuspid valve is normal in structure. Tricuspid valve regurgitation is not demonstrated. No evidence of tricuspid stenosis. Aortic Valve: The aortic valve is tricuspid. There is mild calcification of the aortic valve. Aortic valve regurgitation is not visualized. Aortic valve sclerosis is present, with no evidence of aortic valve stenosis. Pulmonic Valve: The pulmonic valve was normal in structure. Pulmonic valve regurgitation is not visualized. No evidence of pulmonic stenosis. Aorta: Aortic dilatation noted. There is mild dilatation of the aortic root, measuring 38 mm. Venous: The inferior vena cava is normal in size with greater than 50% respiratory variability, suggesting right atrial pressure of 3 mmHg. IAS/Shunts: The interatrial septum was not well visualized.  LEFT VENTRICLE PLAX 2D LVIDd:         5.50 cm   Diastology LVIDs:         4.00 cm   LV e' medial:    5.33 cm/s LV PW:         1.50 cm   LV E/e' medial:  14.0 LV IVS:        1.00 cm   LV e' lateral:   8.38 cm/s LVOT diam:     2.60 cm   LV E/e' lateral: 8.9 LV SV:         90 LV SV Index:   36 LVOT Area:     5.31 cm                           3D Volume EF:                          3D EF:        58 %                          LV EDV:       147 ml                          LV ESV:       61 ml  LV SV:        85 ml RIGHT VENTRICLE RV S prime:     10.20 cm/s TAPSE (M-mode): 1.5 cm LEFT ATRIUM            Index        RIGHT ATRIUM           Index LA diam:      5.00 cm 2.01 cm/m   RA Area:     16.20 cm LA Vol (A4C): 73.5 ml 29.48 ml/m  RA Volume:   40.00 ml  16.04 ml/m  AORTIC VALVE LVOT Vmax:   80.90 cm/s LVOT Vmean:  57.100 cm/s LVOT VTI:    0.169 m  AORTA Ao Root diam: 3.80 cm Ao Asc diam:  3.60 cm MITRAL VALVE MV Area (PHT): 3.72 cm    SHUNTS MV Decel Time: 204 msec    Systemic VTI:  0.17 m MV E velocity: 74.70 cm/s  Systemic Diam: 2.60 cm MV A velocity: 78.40 cm/s MV E/A ratio:  0.95 Charlton Haws MD Electronically signed by Charlton Haws MD Signature Date/Time: 05/01/2023/11:46:45 AM    Final    US RENAL  Result Date: 04/30/2023 CLINICAL DATA:  Hypokalemia EXAM: RENAL / URINARY TRACT ULTRASOUND COMPLETE COMPARISON:  None Available. FINDINGS: Right Kidney: Renal measurements: 14.0 x 7.5 x 8.0 cm = volume: 441.62 mL. Echogenicity within normal limits. No mass or hydronephrosis visualized. Left Kidney: Renal measurements: 13.8 x 6.4 x 6.7 cm = volume: 308.7 mL. Echogenicity within normal limits. No mass or hydronephrosis visualized. Bladder: Appears normal for degree of bladder distention. Other: Incidental 12 mm hepatic cyst. IMPRESSION: No collecting system dilatation. Electronically Signed   By: Karen Kays M.D.   On: 04/30/2023 15:49    Labs: BMET Recent Labs  Lab 04/28/23 1825 04/29/23 0144 04/29/23 1046 04/29/23 1822 04/30/23 0843 05/01/23 0207 05/02/23 0725  NA 135 135 131* 131* 132* 135 136  K 3.2* 3.0* 2.6* 3.1* 2.8* 3.1* 3.8  CL 95* 93* 93* 96* 97* 99 101  CO2 26 31 26 25 22 27 25   GLUCOSE 118* 110* 157* 210* 200* 177* 104*  BUN 12 11 10 12 11 14 14   CREATININE 1.18 1.23 1.01 1.08 1.01 0.96 0.81  CALCIUM 7.9* 8.2* 8.3* 7.8* 8.2* 8.4* 8.3*   CBC Recent Labs  Lab 04/27/23 1741 04/28/23 1115 04/28/23 1115 04/29/23 0144 04/29/23 1046 04/30/23 0843 05/01/23 0207  WBC 16.4* 14.6*   < > 7.6 5.1 3.6* 4.3  NEUTROABS 13.6* 12.4*  --   --  4.3 2.0  --   HGB 17.2 15.9   < >  15.5 16.0 15.8 14.8  HCT 50.7 46.0   < > 45.5 46.9 46.2 44.1  MCV 87 86.3   < > 87.3 86.4 85.6 86.5  PLT 201 165   < > 154 150 161 184   < > = values in this interval not displayed.    Medications:     apixaban  5 mg Oral BID   atorvastatin  80 mg Oral QHS   cefadroxil  500 mg Oral BID   feeding supplement  237 mL Oral BID BM   metoprolol tartrate  25 mg Oral BID   pantoprazole  40 mg Oral Daily   prasugrel  10 mg Oral Daily   Zetta Bills, MD 05/02/2023, 9:11 AM

## 2023-05-02 NOTE — TOC Transition Note (Signed)
Transition of Care [facility]) - CM/SW Discharge Note   Patient Details  Name: Ryan Clarke MRN: [id number] Date of Birth: [birth year]  Transition of Care [facility]) CM/SW Contact:  Tom-Johnson, Hershal Coria, RN Phone Number: 05/02/2023, 3:22 PM   Clinical Narrative:     Patient is scheduled for discharge today.  Readmission Risk Assessment done. Outpatient f/u, hospital f/u and discharge instructions on AVS. No TOC needs or recommendations noted. Family to transport at discharge.  No further TOC needs noted.       Final next level of care: Home/Self Care Barriers to Discharge: Barriers Resolved   Patient Goals and CMS Choice CMS Medicare.gov Compare Post Acute Care list provided to:: Patient Choice offered to / list presented to : NA  Discharge Placement    Patient is scheduled for discharge today.  Readmission Risk Assessment done. Hospital f/u and discharge instructions on AVS. Prescriptions sent to [facility] pharmacy and meds will be delivered to patient at bedside prior discharge. No TOC needs or recommendations noted. Wife to transport at discharge.  No further TOC needs noted.              Patient to be transferred to facility by: Family      Discharge Plan and Services Additional resources added to the After Visit Summary for                  DME Arranged: N/A DME Agency: NA       HH Arranged: NA HH Agency: NA        Social Determinants of Health (SDOH) Interventions SDOH Screenings   Food Insecurity: No Food Insecurity (04/28/2023)  Recent Concern: Food Insecurity - Food Insecurity Present (04/17/2023)  Housing: Low Risk  (04/28/2023)  Transportation Needs: No Transportation Needs (04/28/2023)  Utilities: Not At Risk (04/28/2023)  Alcohol Screen: Low Risk  (04/17/2023)  Depression (PHQ2-9): Low Risk  (04/27/2023)  Financial Resource Strain: Medium Risk (04/17/2023)  Physical Activity: Sufficiently Active (04/17/2023)  Social Connections: Moderately  Isolated (04/17/2023)  Stress: No Stress Concern Present (04/17/2023)  Tobacco Use: Low Risk  (04/28/2023)     Readmission Risk Interventions    05/02/2023   12:40 PM  Readmission Risk Prevention Plan  Post Dischage Appt Complete  Medication Screening Complete  Transportation Screening Complete

## 2023-05-02 NOTE — Discharge Summary (Signed)
Family Medicine Teaching [facility] Discharge Summary  Patient name: Ryan Clarke Medical record number: [id number] Date of birth: 1956-01-23 Age: 67 y.o. Gender: male Date of Admission: 04/28/2023  Date of Discharge: 05/02/23 Admitting Physician: Ryan Collum, DO  Primary Care Provider: Littie Deeds, MD Consultants: Nephrology  Indication for Hospitalization: Dysuria with fever  Discharge Diagnoses/Problem List:  Principal Problem for Admission: Pyelonephritis Other Problems addressed during stay:  Principal Problem:   Pyelonephritis Active Problems:   Paroxysmal atrial fibrillation (HCC)   Ryan Clarke syndrome   Bug bite   Electrolyte abnormality  Brief Hospital Course:  Ryan Clarke is a 67 y.o. male who was admitted to the [facility] Medicine Teaching Service at [facility] for pyelonephritis. Hospital course is outlined below by problem.   Pyelonephritis Admitted with dysuria, nausea, and "home reported" fever for 3 days.  No true documented fever in hospital. Abdominal symptoms did not improve with 1 dose of Rocephin and Bactrim outpatient.  Blood work in clinic with potassium 2.8, bilirubin 5.6.  BP mildly soft on admission.  UA collected with small leukocytes, 30 protein, rare bacteria.  Urine culture with less than 10,000 colonies of E. coli.  He was given Rocephin with resolution of symptoms by the next day.  He was transitioned to cefadroxil for a total 6-day antibiotic course at discharge.  Electrolyte abnormality K3.0 and mag 1.4 on admission and remained similar after multiple rounds of supplementation.  Potentially due to dietary issues vs. Infection vs. renal component.  However, given on presentation of electrolytes and recurrent UTI, consulted nephrology who recommended sodium/potassium/creatinine urine and renal ultrasound which demonstrated no collecting system dilatation and incidental 12 mm hepatic cyst.  Urine studies were evaluated by nephrology and showed fractional  excretion of potassium at 11% indicating tubular wasting and patient was initiated on amiloride 5 mg daily and advised to avoid thiazide use in the future.  Chlorthalidone was permanently discontinued and not advised to use in the future.  Fortunately, amiloride was not available through transitions of care and this medication was sent to patient's verified pharmacy.  No nephrology follow-up was recommended.  Afib with RVR Had episode over admission of RVR with chest pain and diaphoresis.  EKG at that time was confirmatory.  Chest X-ray without acute process.  He was started on IV metoprolol with good rate control and symptom resolution.  He was transitioned to home metoprolol dosing without further events.  He was initiated on Eliquis as well for his A-fib as he was previously not on this due to financial reasons however received pharmacy assistance during this hospitalization.  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  Appears he had a level of 4.5 in 2018 with multiple other elevations in between.  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   Other labs were obtained to rule out RMSF given elevated bilirubin, which abnormality, and abdominal pain-these are pending at discharge.  Other conditions that were chronic and stable: CAD status post CABG with pacemaker, paroxysmal A-fib, GERD.  Issues for follow up: Follow-up workup of recurrent UTI-less likely post renal obstruction, sexual practices, no catheterization Recheck BMP for potassium  Disposition: Home  Discharge Condition: Stable  Discharge Exam:  Vitals:   05/02/23 0330 05/02/23 0821  BP: 106/72 111/73  Pulse: 75 73  Resp: 18 18  Temp: 98.1 F (36.7 C) 97.7 F (36.5 C)  SpO2: 94% 94%  General: Sitting in chair, NAD CV:  RRR, no murmurs auscultated Respiratory: CTAB, normal WOB Extremities: Moves all extremities  equally  Significant Procedures: None  Significant Labs and Imaging:  Recent Labs  Lab 05/01/23 0207  WBC 4.3  HGB 14.8  HCT 44.1  PLT 184   Recent Labs  Lab 05/01/23 0207 05/02/23 0725  NA 135 136  K 3.1* 3.8  CL 99 101  CO2 27 25  GLUCOSE 177* 104*  BUN 14 14  CREATININE 0.96 0.81  CALCIUM 8.4* 8.3*  MG 1.8 1.7  ALKPHOS 51  --   AST 74*  --   ALT 65*  --   ALBUMIN 2.7*  --    Pertinent Imaging: CXR 6/13: Stable chest. No evidence of acute cardiopulmonary process. Pacemaker leads appear unchanged  Renal u/s: No collecting system dilatation.  Echo: EF 55-60%, mild aortic root dialation   Results/Tests Pending at Time of Discharge: None  Discharge Medications:  Allergies as of 05/02/2023   No Known Allergies      Medication List     STOP taking these medications    chlorthalidone 25 MG tablet Commonly known as: HYGROTON   ondansetron 4 MG disintegrating tablet Commonly known as: ZOFRAN-ODT   sulfamethoxazole-trimethoprim 800-160 MG tablet Commonly known as: BACTRIM DS       TAKE these medications    acetaminophen 500 MG tablet Commonly known as: TYLENOL Take 2 tablets (1,000 mg total) by mouth every 6 (six) hours as needed. What changed:  when to take this reasons to take this   aMILoride 5 MG tablet Commonly known as: MIDAMOR Take 1 tablet (5 mg total) by mouth daily. Start taking on: May 03, 2023   apixaban 5 MG Tabs tablet Commonly known as: ELIQUIS Take 1 tablet (5 mg total) by mouth 2 (two) times daily.   atorvastatin 80 MG tablet Commonly known as: LIPITOR TAKE 1 TABLET BY MOUTH EVERYDAY AT BEDTIME What changed: See the new instructions.   cefadroxil 500 MG capsule Commonly known as: DURICEF Take 1 capsule (500 mg total) by mouth 2 (two) times daily for 3 doses.   metoprolol tartrate 25 MG tablet Commonly known as: LOPRESSOR TAKE 1 TABLET BY MOUTH TWICE A DAY   pantoprazole 40 MG tablet Commonly known as: PROTONIX TAKE  1 TABLET BY MOUTH EVERY DAY   prasugrel 10 MG Tabs tablet Commonly known as: EFFIENT TAKE 1 TABLET BY MOUTH EVERY DAY       Discharge Instructions: Please refer to Patient Instructions section of EMR for full details.  Patient was counseled important signs and symptoms that should prompt return to medical care, changes in medications, dietary instructions, activity restrictions, and follow up appointments.   Follow-Up Appointments: 05/05/2023 [facility] with Dr. Gerlene Burdock son  Shelby Mattocks, DO 05/02/2023, 12:03 PM PGY-2, [facility]

## 2023-05-02 NOTE — Plan of Care (Signed)
  Problem: Health Behavior/Discharge Planning: Goal: Ability to manage health-related needs will improve Outcome: Adequate for Discharge   Problem: Clinical Measurements: Goal: Ability to maintain clinical measurements within normal limits will improve Outcome: Adequate for Discharge Goal: Will remain free from infection Outcome: Adequate for Discharge Goal: Diagnostic test results will improve Outcome: Adequate for Discharge Goal: Respiratory complications will improve Outcome: Adequate for Discharge Goal: Cardiovascular complication will be avoided Outcome: Adequate for Discharge   Problem: Activity: Goal: Risk for activity intolerance will decrease Outcome: Adequate for Discharge   Problem: Nutrition: Goal: Adequate nutrition will be maintained Outcome: Adequate for Discharge   Problem: Coping: Goal: Level of anxiety will decrease Outcome: Adequate for Discharge   Problem: Elimination: Goal: Will not experience complications related to bowel motility Outcome: Adequate for Discharge Goal: Will not experience complications related to urinary retention Outcome: Adequate for Discharge   Problem: Pain Managment: Goal: General experience of comfort will improve Outcome: Adequate for Discharge   Problem: Safety: Goal: Ability to remain free from injury will improve Outcome: Adequate for Discharge   Problem: Skin Integrity: Goal: Risk for impaired skin integrity will decrease Outcome: Adequate for Discharge   Problem: Urinary Elimination: Goal: Signs and symptoms of infection will decrease Outcome: Adequate for Discharge   

## 2023-05-03 LAB — CULTURE, BLOOD (ROUTINE X 2)
Culture: NO GROWTH
Culture: NO GROWTH
Special Requests: ADEQUATE

## 2023-05-04 ENCOUNTER — Telehealth: Payer: Self-pay

## 2023-05-04 NOTE — Transitions of Care (Post Inpatient/ED Visit) (Unsigned)
   05/04/2023  Name: Ryan Clarke MRN: [id number] DOB: October 06, 1956  Today's TOC FU Call Status: Today's TOC FU Call Status:: Unsuccessul Call (1st Attempt) Unsuccessful Call (1st Attempt) Date: 05/04/23  Attempted to reach the patient regarding the most recent Inpatient/ED visit.  Follow Up Plan: Additional outreach attempts will be made to reach the patient to complete the Transitions of Care (Post Inpatient/ED visit) call.   Signature Karena Addison, LPN [facility] Nurse Health Advisor Direct Dial [phone]

## 2023-05-05 ENCOUNTER — Ambulatory Visit (INDEPENDENT_AMBULATORY_CARE_PROVIDER_SITE_OTHER): Payer: Medicare HMO | Admitting: Family Medicine

## 2023-05-05 ENCOUNTER — Encounter: Payer: Self-pay | Admitting: Family Medicine

## 2023-05-05 ENCOUNTER — Other Ambulatory Visit: Payer: Self-pay

## 2023-05-05 VITALS — BP 109/68 | HR 67 | Wt 283.4 lb

## 2023-05-05 DIAGNOSIS — I480 Paroxysmal atrial fibrillation: Secondary | ICD-10-CM | POA: Diagnosis not present

## 2023-05-05 DIAGNOSIS — R051 Acute cough: Secondary | ICD-10-CM

## 2023-05-05 DIAGNOSIS — E876 Hypokalemia: Secondary | ICD-10-CM | POA: Diagnosis not present

## 2023-05-05 DIAGNOSIS — Z09 Encounter for follow-up examination after completed treatment for conditions other than malignant neoplasm: Secondary | ICD-10-CM | POA: Diagnosis not present

## 2023-05-05 LAB — SPOTTED FEVER GROUP ANTIBODIES
Spotted Fever Group IgG: <1:64 {titer}
Spotted Fever Group IgM: <1:64 {titer}

## 2023-05-05 NOTE — Progress Notes (Signed)
SUBJECTIVE:   CHIEF COMPLAINT / HPI:  Chief Complaint  Patient presents with   Hospitalization Follow-up    Patient was recently hospitalized from 6/11 to 6/15 for pyelonephritis.  He was treated with ceftriaxone, transition to cefadroxil for 6-day total antibiotic course. Patient also noted to have hypokalemia and hypomagnesemia which was difficult to treat.  Nephrology was consulted, patient was found to have tubular wasting based on urine studies and was initiated on amiloride 5 mg. Patient also had an episode of atrial fibrillation with RVR which was treated with IV metoprolol.  He was transition to oral metoprolol and started on apixaban.  Patient is coming by his wife today. He reports he has been doing well since discharge.  Denies any urinary symptoms, fever, chills.  Denies any blood in stool. Denies any difficulty with urination or incomplete emptying.  He reports his last urinary tract infection was about 7 years ago. He just picked up the apixaban and amiloride today and has not yet started taking the medication.  He has had a cough that started 5 to 6 days ago while he was hospitalized.  He had a chest x-ray performed in the hospital which did not have any abnormal findings. Denies congestion, rhinorrhea.  He does have some postnasal drainage.  PERTINENT  PMH / PSH: Atrial fibrillation, CAD, GERD  Patient Care Team: Littie Deeds, MD as PCP - General (Family Medicine) Jodelle Red, MD as PCP - Cardiology (Cardiology) Littie Deeds, MD as PCP - Family Medicine (Family Medicine) Janalyn Harder, MD (Inactive) as Consulting Physician (Dermatology)   OBJECTIVE:   BP 109/68   Pulse 67   Wt 283 lb 6.4 oz (128.5 kg)   SpO2 94%   BMI 37.39 kg/m   Physical Exam Constitutional:      General: He is not in acute distress. HENT:     Nose: Congestion present. No rhinorrhea.     Mouth/Throat:     Mouth: Mucous membranes are moist.     Pharynx: Oropharynx is clear.  No oropharyngeal exudate or posterior oropharyngeal erythema.  Cardiovascular:     Rate and Rhythm: Normal rate and regular rhythm.     Heart sounds: Normal heart sounds.  Pulmonary:     Effort: Pulmonary effort is normal. No respiratory distress.     Breath sounds: Normal breath sounds.  Musculoskeletal:     Cervical back: Neck supple.  Lymphadenopathy:     Cervical: No cervical adenopathy.  Neurological:     Mental Status: He is alert.         05/05/2023    2:53 PM  Depression screen PHQ 2/9  Decreased Interest 0  Down, Depressed, Hopeless 0  PHQ - 2 Score 0  Altered sleeping 0  Tired, decreased energy 0  Change in appetite 0  Feeling bad or failure about yourself  0  Trouble concentrating 0  Moving slowly or fidgety/restless 0  Suicidal thoughts 0  PHQ-9 Score 0     {Show previous vital signs (optional):23777}    ASSESSMENT/PLAN:   1. Hospital discharge follow-up Overall doing well since hospitalization.  He has not had frequent UTIs, I do not think further workup is indicated.  He does not have any LUTS suggestive of BPH.  2. Hypokalemia due to excessive renal loss of potassium Recently started on amiloride, will repeat BMP and magnesium today. - Basic Metabolic Panel - Magnesium  3. Paroxysmal atrial fibrillation (HCC) Recently started on apixaban but has not yet started his outpatient  prescription.  Will check CBC. - CBC  4. Acute cough Associated with postnasal drip.  Recent CXR unremarkable.  I think this is likely allergic rhinitis/UA CS.  Advised that he trial Flonase for this.  Return if symptoms worsen or fail to improve.   Littie Deeds, MD [facility]

## 2023-05-05 NOTE — Patient Instructions (Signed)
It was nice seeing you today!  Blood work today.  See me in 3 months or whenever is a good for you.  Stay well, Ahlivia Salahuddin, MD [facility] [phone]  --  Make sure to check out at the front desk before you leave today.  Please arrive at least 15 minutes prior to your scheduled appointments.  If you had blood work today, I will send you a MyChart message or a letter if results are normal. Otherwise, I will give you a call.  If you had a referral placed, they will call you to set up an appointment. Please give us a call if you don't hear back in the next 2 weeks.  If you need additional refills before your next appointment, please call your pharmacy first.  

## 2023-05-05 NOTE — Transitions of Care (Post Inpatient/ED Visit) (Signed)
   05/05/2023  Name: Ryan Clarke MRN: [id number] DOB: 04/16/1956 Patient has already been seen in office Today's TOC FU Call Status: Today's TOC FU Call Status:: Unsuccessul Call (1st Attempt) Unsuccessful Call (1st Attempt) Date: 05/04/23  Attempted to reach the patient regarding the most recent Inpatient/ED visit.  Follow Up Plan: No further outreach attempts will be made at this time. We have been unable to contact the patient.  Signature Karena Addison, LPN [facility] Nurse Health Advisor Direct Dial [phone]

## 2023-05-06 ENCOUNTER — Encounter: Payer: Self-pay | Admitting: Family Medicine

## 2023-05-06 LAB — CBC
Hematocrit: 46.2 % (ref 37.5–51.0)
Hemoglobin: 15.5 g/dL (ref 13.0–17.7)
MCH: 30.2 pg (ref 26.6–33.0)
MCHC: 33.5 g/dL (ref 31.5–35.7)
MCV: 90 fL (ref 79–97)
Platelets: 307 10*3/uL (ref 150–450)
RBC: 5.14 x10E6/uL (ref 4.14–5.80)
RDW: 12.9 % (ref 11.6–15.4)
WBC: 6 10*3/uL (ref 3.4–10.8)

## 2023-05-06 LAB — BASIC METABOLIC PANEL
BUN/Creatinine Ratio: 18 (ref 10–24)
BUN: 17 mg/dL (ref 8–27)
CO2: 24 mmol/L (ref 20–29)
Calcium: 9.1 mg/dL (ref 8.6–10.2)
Chloride: 105 mmol/L (ref 96–106)
Creatinine, Ser: 0.94 mg/dL (ref 0.76–1.27)
Glucose: 97 mg/dL (ref 70–99)
Potassium: 4.5 mmol/L (ref 3.5–5.2)
Sodium: 141 mmol/L (ref 134–144)
eGFR: 89 mL/min/{1.73_m2} (ref >59)

## 2023-05-06 LAB — MAGNESIUM: Magnesium: 2 mg/dL (ref 1.6–2.3)

## 2023-05-13 ENCOUNTER — Encounter (HOSPITAL_BASED_OUTPATIENT_CLINIC_OR_DEPARTMENT_OTHER): Payer: Self-pay

## 2023-05-13 NOTE — Telephone Encounter (Signed)
No AF noted 05/12/23. Several PMT events occurred which were successfully treated which is not a new finding for patien. AT/AF burden is <1%.

## 2023-05-13 NOTE — Telephone Encounter (Signed)
Patient responding to you  ?

## 2023-05-13 NOTE — Telephone Encounter (Signed)
Hi there, patient requesting pacer check, please assist

## 2023-05-19 ENCOUNTER — Ambulatory Visit (HOSPITAL_COMMUNITY)
Admission: RE | Admit: 2023-05-19 | Disposition: A | Discharge status: Home / Self Care | Payer: Medicare HMO | Source: Ambulatory Visit

## 2023-05-19 ENCOUNTER — Ambulatory Visit (INDEPENDENT_AMBULATORY_CARE_PROVIDER_SITE_OTHER): Payer: Medicare HMO | Admitting: Family Medicine

## 2023-05-19 ENCOUNTER — Encounter: Payer: Self-pay | Admitting: Family Medicine

## 2023-05-19 VITALS — BP 124/70 | HR 62 | Ht 73.0 in | Wt 284.8 lb

## 2023-05-19 DIAGNOSIS — R300 Dysuria: Secondary | ICD-10-CM

## 2023-05-19 DIAGNOSIS — N390 Urinary tract infection, site not specified: Secondary | ICD-10-CM | POA: Diagnosis not present

## 2023-05-19 DIAGNOSIS — R002 Palpitations: Secondary | ICD-10-CM | POA: Diagnosis not present

## 2023-05-19 DIAGNOSIS — I452 Bifascicular block: Secondary | ICD-10-CM | POA: Insufficient documentation

## 2023-05-19 LAB — POCT URINALYSIS DIP (CLINITEK)
Bilirubin, UA: NEGATIVE
Glucose, UA: NEGATIVE mg/dL
Ketones, POC UA: NEGATIVE mg/dL
Nitrite, UA: NEGATIVE
POC PROTEIN,UA: NEGATIVE
Spec Grav, UA: 1.015 (ref 1.010–1.025)
Urobilinogen, UA: 0.2 E.U./dL
pH, UA: 5.5 (ref 5.0–8.0)

## 2023-05-19 LAB — POCT UA - MICROSCOPIC ONLY: WBC, Ur, HPF, POC: >20 (ref 0–5)

## 2023-05-19 MED ORDER — CEPHALEXIN 500 MG PO CAPS
500.0000 mg | ORAL_CAPSULE | Freq: Four times a day (QID) | ORAL | 0 refills | Status: AC
Start: 2023-05-19 — End: 2023-05-26

## 2023-05-19 NOTE — Assessment & Plan Note (Signed)
Urinalysis did presumptively treat with cephalexin 500 mg 4 times daily for the next 7 days.  Instructed patient on red flag symptoms to look out for.  Given recent history of pyelonephritis we will follow-up closely with him later in the week

## 2023-05-19 NOTE — Progress Notes (Addendum)
    SUBJECTIVE:   CHIEF COMPLAINT / HPI:   UTI symptoms similar to what he had two weeks ago. Burning, urgency, frequency and hesitancy. Denies fevers, chills, back pain. Does state that his urine looked orange earlier. Has not tried any OTCs for relief.  Heart palpitations since hospital discharge. Gets cold and sweaty and clammy. Feels weak and has to sit down. Has had three episodes over the last two weeks.  Denies loss of consciousness and current chest pain.  No current shortness of breath or leg swelling.  PERTINENT  PMH / PSH: Treated for Pyelonephritis in June. History of Paroxysmal Afib with pace maker.  OBJECTIVE:   BP 124/70   Pulse 62   Ht 6\' 1"  (1.854 m)   Wt 284 lb 12.8 oz (129.2 kg)   SpO2 95%   BMI 37.57 kg/m   General: A&O, NAD HEENT: No sign of trauma, EOM grossly intact Cardiac: RRR, no m/r/g Respiratory: CTAB, normal WOB, no w/c/r Abdomen: Soft nontender nondistended.  No CVA tenderness. Extremities: NTTP, no peripheral edema. GU: Prostate normal.  Not boggy, no asymmetry.  Tenderness to palpation. ASSESSMENT/PLAN:   Urinary tract infection Urinalysis did presumptively treat with cephalexin 500 mg 4 times daily for the next 7 days.  Instructed patient on red flag symptoms to look out for.  Given recent history of pyelonephritis we will follow-up closely with him later in the week  Palpitations EKG in office today with no obvious changes compared to previous KG.  Discussed with patient seriousness of situation, given recent changes to medication over concern for hypokalemia risk of hyperkalemia on amiloride.  Patient declined to go to the emergency department after shared decision making conversation.  Neysa Bonito was in the room patient was of sound mind and voiced understanding.  We ordered STAT labs to check for hyperkalemia as well as other pertinent issues such as anemia or other electrolyte imbalances.  We have signed out with night team so they are aware of  situation and confirmed patient's phone number in case night team needs to get in contact with him.  Patient scheduled for close follow up tomorrow with instructions to go to the emergency department if symptoms worsen or he develops new chest pain.     Gerrit Heck, DO [facility]

## 2023-05-19 NOTE — Progress Notes (Signed)
    SUBJECTIVE:   CHIEF COMPLAINT / HPI:   Palpitations Seen yesterday. Normal EKG. Labs pending  UTI Started on Keflex QID x 7 days  PERTINENT  PMH / PSH: ***  OBJECTIVE:   There were no vitals taken for this visit. ***  General: NAD, pleasant, able to participate in exam Cardiac: RRR, no murmurs. Respiratory: CTAB, normal effort, No wheezes, rales or rhonchi Abdomen: Bowel sounds present, nontender, nondistended Extremities: no edema or cyanosis. Skin: warm and dry, no rashes noted Neuro: alert, no obvious focal deficits Psych: Normal affect and mood  ASSESSMENT/PLAN:   No problem-specific Assessment & Plan notes found for this encounter.     Dr. Elberta Fortis, DO [facility]    {    This will disappear when note is signed, click to select method of visit    :1}

## 2023-05-19 NOTE — Patient Instructions (Addendum)
It was wonderful to see you today.  Today we talked about:  Your UTI symptoms. We will treat you with cephalexin (keflex) 500 mg, 4 times a day for 7 days. Because you recently were treated in the hospital for pyelonephritis we will follow-up with you later this week or early next week.  If your symptoms do not improve or get worse within 72 hours please call our office or go to the emergency department.  Also discussed your heart palpitations. We ordered you stat labs to check your potassium and will call you back with the results later today. Depending on the results we will make changes. If you have another episode, please go to the emergency department. We will follow up with you tomorrow in clinic.  Thank you for choosing [facility].   Please call [phone] with any questions about today's appointment.  Please arrive at least 15 minutes prior to your scheduled appointments.   If you had blood work today, I will send you a MyChart message or a letter if results are normal. Otherwise, I will give you a call.   If you had a referral placed, they will call you to set up an appointment. Please give [place] a call if you don't hear back in the next 2 weeks.   If you need additional refills before your next appointment, please call your pharmacy first.   Gerrit Heck, DO Family Medicine

## 2023-05-19 NOTE — Assessment & Plan Note (Addendum)
EKG in office today with no obvious changes compared to previous KG.  Discussed with patient seriousness of situation, given recent changes to medication over concern for hypokalemia risk of hyperkalemia on amiloride.  Patient declined to go to the emergency department after shared decision making conversation.  Neysa Bonito was in the room patient was of sound mind and voiced understanding.  We ordered STAT labs to check for hyperkalemia as well as other pertinent issues such as anemia or other electrolyte imbalances.  We have signed out with night team so they are aware of situation and confirmed patient's phone number in case night team needs to get in contact with him.  Patient scheduled for close follow up tomorrow with instructions to go to the emergency department if symptoms worsen or he develops new chest pain.

## 2023-05-19 NOTE — Addendum Note (Signed)
Addended by: Burley Saver E on: 05/19/2023 05:38 PM   Modules accepted: Orders

## 2023-05-20 ENCOUNTER — Ambulatory Visit (INDEPENDENT_AMBULATORY_CARE_PROVIDER_SITE_OTHER): Payer: Medicare HMO | Admitting: Family Medicine

## 2023-05-20 VITALS — BP 109/83 | HR 56 | Ht 73.0 in | Wt 281.0 lb

## 2023-05-20 DIAGNOSIS — R002 Palpitations: Secondary | ICD-10-CM

## 2023-05-20 DIAGNOSIS — N3000 Acute cystitis without hematuria: Secondary | ICD-10-CM

## 2023-05-20 DIAGNOSIS — I480 Paroxysmal atrial fibrillation: Secondary | ICD-10-CM | POA: Diagnosis not present

## 2023-05-20 DIAGNOSIS — K219 Gastro-esophageal reflux disease without esophagitis: Secondary | ICD-10-CM

## 2023-05-20 DIAGNOSIS — N390 Urinary tract infection, site not specified: Secondary | ICD-10-CM

## 2023-05-20 LAB — BASIC METABOLIC PANEL
BUN/Creatinine Ratio: 11 (ref 10–24)
BUN: 11 mg/dL (ref 8–27)
CO2: 25 mmol/L (ref 20–29)
Calcium: 9.3 mg/dL (ref 8.6–10.2)
Chloride: 103 mmol/L (ref 96–106)
Creatinine, Ser: 0.97 mg/dL (ref 0.76–1.27)
Glucose: 78 mg/dL (ref 70–99)
Potassium: 4.3 mmol/L (ref 3.5–5.2)
Sodium: 141 mmol/L (ref 134–144)
eGFR: 86 mL/min/{1.73_m2} (ref >59)

## 2023-05-20 LAB — CBC
Hematocrit: 45.9 % (ref 37.5–51.0)
Hemoglobin: 16 g/dL (ref 13.0–17.7)
MCH: 31.1 pg (ref 26.6–33.0)
MCHC: 34.9 g/dL (ref 31.5–35.7)
MCV: 89 fL (ref 79–97)
Platelets: 244 10*3/uL (ref 150–450)
RBC: 5.15 x10E6/uL (ref 4.14–5.80)
RDW: 13 % (ref 11.6–15.4)
WBC: 5.8 10*3/uL (ref 3.4–10.8)

## 2023-05-20 LAB — TSH: TSH: 1.11 u[IU]/mL (ref 0.450–4.500)

## 2023-05-20 MED ORDER — FAMOTIDINE 20 MG PO TABS
20.0000 mg | ORAL_TABLET | ORAL | 2 refills | Status: DC | PRN
Start: 2023-05-20 — End: 2023-07-22

## 2023-05-20 NOTE — Assessment & Plan Note (Signed)
Resolved today. Reports three episodes of worrisome palpitations associated with diaphoresis and clamminess over the past three months. Denies chest pain during that time. Occurs with activity and at rest. 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 Appointment in Cardiology in 06/2023. Concern for worsening heart disease and possibly unstable angina episodes. -Called Cardiology [facility]) and scheduled patient for Friday (7/5) @ 10:30AM for follow up. Called patient and informed him -Strict return precautions given. Advised to go to the hospital if symptoms return for further evaluation

## 2023-05-20 NOTE — Assessment & Plan Note (Signed)
On Pantoprazole 40mg  daily but continues to have some reflux symptoms. Reports reflux can sometimes obscure cardiac symptoms and he gets concerns about MI. -Start Famotidine 20mg  prn reflux

## 2023-05-20 NOTE — Patient Instructions (Signed)
It was wonderful to see you today! Thank you for choosing [facility].   Please bring ALL of your medications with you to every visit.   Today we talked about:  I am referring you to Urology for your recurrent urinary tract infections. I also have concern you could have a prostate infection so we may switch antibiotics depending on your urine culture. I am calling to get you a sooner appointment with Cardiology to be seen for the palpitations. If you have another episode, please go to the hospital. I am sending in Famotidine that you can take as needed for reflux symptoms.  Please follow up in 1 month with PCP  If you haven't already, sign up for My Chart to have easy access to your labs results, and communication with your primary care physician.   We are checking some labs today. If they are abnormal, I will call you. If they are normal, I will send you a MyChart message (if it is active) or a letter in the mail. If you do not hear about your labs in the next 2 weeks, please call the office.  Call the clinic at [phone] if your symptoms worsen or you have any concerns.  Please be sure to schedule follow up at the front desk before you leave today.   Elberta Fortis, DO Family Medicine

## 2023-05-20 NOTE — Assessment & Plan Note (Signed)
Started on Keflex 500mg  QID x 7 days at last visit for UTI. Symptoms now resolved. H/o pyelonephritis requiring hospitalization in 01/2023 and not second UTI in 3 months. Concern for UTI vs underlying prostatitis. UC from yesterday pending. -Refer to Urology given recurrent UTI -F/u UC results. Consider switching to abx to cover prostatitis for longer duration given recurrent symptoms

## 2023-05-20 NOTE — Assessment & Plan Note (Signed)
Appears to be in regular rhythm. Reports he has not received any alerts from his pacemaker.

## 2023-05-21 NOTE — Progress Notes (Deleted)
Office Visit    Patient Name: Ryan Clarke Date of Encounter: 05/21/2023  Primary Care Provider:  Lorayne Bender, MD Primary Cardiologist:  Jodelle Red, MD  Chief Complaint  67 year old male with a past medical history of CAD with remote stents, now S/p CABG x 2 in 11/2020, s/p dual chamber St Jude pacemaker, atrial fibrillation, HTN, and HLD. He presents today regarding palpitations.  Past Medical History    Past Medical History:  Diagnosis Date   Carpal tunnel syndrome 09/10/2020   Coronary artery disease    Hypertension    Nodular basal cell carcinoma (BCC) 09/04/2021   Left Lower Back   Past Surgical History:  Procedure Laterality Date   CORONARY ARTERY BYPASS GRAFT N/A 11/26/2020   Procedure: CORONARY ARTERY BYPASS GRAFTING (CABG) TIMES TWO USING BILATERAL INTERNAL MAMMARY ARTERIES;  Surgeon: Alleen Borne, MD;  Location: [facility] OR;  Service: Open Heart Surgery;  Laterality: N/A;  BIMA   CORONARY BALLOON ANGIOPLASTY N/A 09/14/2020   Procedure: CORONARY BALLOON ANGIOPLASTY;  Surgeon: Lennette Bihari, MD;  Location: [facility] INVASIVE CV LAB;  Service: Cardiovascular;  Laterality: N/A;   CORONARY STENT PLACEMENT     LEFT HEART CATH AND CORONARY ANGIOGRAPHY N/A 09/14/2020   Procedure: LEFT HEART CATH AND CORONARY ANGIOGRAPHY;  Surgeon: Lennette Bihari, MD;  Location: [facility] INVASIVE CV [facility];  Service: Cardiovascular;  Laterality: N/A;   LEFT HEART CATH AND CORONARY ANGIOGRAPHY N/A 11/19/2020   Procedure: LEFT HEART CATH AND CORONARY ANGIOGRAPHY;  Surgeon: Lennette Bihari, MD;  Location: [facility] CV [facility];  Service: Cardiovascular;  Laterality: N/A;   PACEMAKER INSERTION     TEE WITHOUT CARDIOVERSION N/A 11/26/2020   Procedure: TRANSESOPHAGEAL ECHOCARDIOGRAM (TEE);  Surgeon: Alleen Borne, MD;  Location: [facility] OR;  Service: Open Heart Surgery;  Laterality: N/A;    Allergies  No Known Allergies   Labs/Other Studies Reviewed    The following studies were reviewed  today: *** Cardiac Studies & Procedures   CARDIAC CATHETERIZATION  CARDIAC CATHETERIZATION 11/19/2020  Narrative  1st Mrg lesion is 40% stenosed.  Prox Cx to Mid Cx lesion is 80% stenosed.  Non-stenotic RPAV lesion was previously treated.  Ost RCA to Dist RCA lesion is 5% stenosed.  Dist LM to Ost LAD lesion is 80% stenosed.  Mid LAD lesion is 60% stenosed with 60% stenosed side branch in 1st Sept.  Previously placed 2nd Diag stent (unknown type) is widely patent.  Multivessel CAD with stents in the LAD, diagonal, circumflex, circumflex marginal, and RCA extending to the PLA takeoff.  The LAD has progressive 80% ostial stenosis which seems to occur immediately proximal to the proximal stent.  There is diffuse 60% narrowing in the proximal and mid stented segment.  The diagonal stent is patent.  The distal portion of the mid LAD stent is patent.  Left circumflex stent extending from the ostium to the mid AV groove circumflex with focal eccentric 80% restenosis in the mid circumflex immediately after the takeoff of the marginal vessel.  There is 30 to 40% ostial narrowing in th marginal vessel which is stented ostially to proximally.  The RCA is diffusely stented from the ostium to the takeoff of the PLA vessel.  There is mild luminal irregularity.  Preserved global LV contractility with EF estimate at 18 mmHg.  RECOMMENDATION: The angiographic findings were reviewed with Dr. Allyson Sabal in the catheterization laboratory.  With the ostial progression of 80% in the LAD as well as again restenosis in the circumflex vessel  recommend surgical consultation for optimal long-term benefit.  We will hold prasugrel.  Increase anti-ischemic medications, optimal lipid management and BP control.  Findings Coronary Findings Diagnostic  Dominance: Right  Left Main Dist LM to Ost LAD lesion is 80% stenosed. The lesion was previously treated.  Left Anterior Descending Mid LAD lesion is 60% stenosed  with 60% stenosed side branch in 1st Sept. The lesion was previously treated.  First Diagonal Branch Vessel is small in size.  Second Diagonal Branch Previously placed 2nd Diag stent (unknown type) is widely patent.  Left Circumflex Prox Cx to Mid Cx lesion is 80% stenosed. The lesion was previously treated.  First Obtuse Marginal Branch 1st Mrg lesion is 40% stenosed. The lesion was previously treated.  Right Coronary Artery Ost RCA to Dist RCA lesion is 5% stenosed. The lesion was previously treated.  Right Posterior Atrioventricular Artery Non-stenotic RPAV lesion was previously treated.  Intervention  No interventions have been documented.   CARDIAC CATHETERIZATION  CARDIAC CATHETERIZATION 09/14/2020  Narrative  Previously placed RPAV stent (unknown type) is widely patent.  Ost RCA to Dist RCA lesion is 5% stenosed.  Previously placed Prox LAD to Dist LAD stent (unknown type) is widely patent.  Ost LAD lesion is 50% stenosed.  Prox Cx to Mid Cx lesion is 99% stenosed.  1st Mrg lesion is 30% stenosed.  Post intervention, there is a 30% residual stenosis.  Post intervention, there is a 20% residual stenosis.  The left ventricular systolic function is normal.  LV end diastolic pressure is normal.  Multivessel diffuse stenting with multiple stents inserted from the ostium of the LAD to the distal third of the LAD with focal 50% ostial narrowing; stenting of the proximal to mid circumflex vessel with bifurcation stenting of the OM vessel with 99% stenosis with thrombus in the circumflex stent immediately after the takeoff of the stented OM vessel with TIMI 0-I flow down the distal circumflex; and diffuse stenting of the entire dominant RCA extending from the ostium to the PDA without significant restenosis.  Preserved global LV function  Very difficult intervention with initial plaque shifting into the OM vessel requiring ultimate PTCA of both the OM ostium as  well as the circumflex stent with Cutting Balloon intervention to the circumflex stent due to persistent intimal hyperplasia not resolved with noncompliant balloon dilatation.  Ultimate kissing balloon technique to the OM and circumflex with a residual narrowing in the OM at 20% and in the circumflex at  30% with resultant brisk TIMI-3 flow to the distal circumflex vessel supplying several additional vessels.  RECOMMENDATION: Long-term DAPT indefinitely due to the extensive stenting in this patient who previously had 29 stents placed in all of his major coronary arteries.  Aggressive lipid-lowering therapy with target LDL in the 50s or below with optimal blood pressure control.  Findings Coronary Findings Diagnostic  Dominance: Right  Left Main  Left Anterior Descending Ost LAD lesion is 50% stenosed. The lesion was previously treated. Previously placed Prox LAD to Dist LAD stent (unknown type) is widely patent.  Left Anterior Descending Ost LAD lesion is 50% stenosed. The lesion was previously treated. Previously placed Prox LAD to Dist LAD stent (unknown type) is widely patent.  Left Circumflex Prox Cx to Mid Cx lesion is 99% stenosed. The lesion was previously treated.  First Obtuse Marginal Branch 1st Mrg lesion is 30% stenosed. The lesion was previously treated.  Right Coronary Artery Ost RCA to Dist RCA lesion is 5% stenosed. The lesion was  previously treated.  Right Posterior Atrioventricular Artery Previously placed RPAV stent (unknown type) is widely patent.  Intervention  Prox Cx to Mid Cx lesion Angioplasty Post-Intervention Lesion Assessment The intervention was successful. Pre-interventional TIMI flow is 1. Post-intervention TIMI flow is 3. No complications occurred at this lesion. There is a 30% residual stenosis post intervention.  1st Mrg lesion Angioplasty Post-Intervention Lesion Assessment The intervention was successful. Pre-interventional TIMI flow is 1.  Post-intervention TIMI flow is 3. No complications occurred at this lesion. There is a 20% residual stenosis post intervention.     ECHOCARDIOGRAM  ECHOCARDIOGRAM COMPLETE 05/01/2023  Narrative ECHOCARDIOGRAM REPORT    Patient Name:   DERREN LAVERNE Date of Exam: 05/01/2023 Medical Rec #:  [id number]    Height:       73.0 in Accession #:    [id number]   Weight:       282.8 lb Date of Birth:  11-Jan-1956    BSA:          2.493 m Patient Age:    63 years     BP:           110/65 mmHg Patient Gender: M            HR:           70 bpm. Exam Location:  Inpatient  Procedure: 2D Echo, 3D Echo, Cardiac Doppler and Color Doppler  Indications:    Dyspnea R06.00  History:        Patient has prior history of Echocardiogram examinations, most recent 11/19/2020. CAD, Arrythmias:Atrial Fibrillation, Signs/Symptoms:Dyspnea; Risk Factors:Non-Smoker and Hypertension.  Sonographer:    Aron Baba Referring Phys: [id number] SARA L NEAL   Sonographer Comments: Suboptimal parasternal window and no subcostal window. IMPRESSIONS   1. Left ventricular ejection fraction, by estimation, is 55 to 60%. The left ventricle has normal function. The left ventricle has no regional wall motion abnormalities. Left ventricular diastolic parameters were normal. 2. Right ventricular systolic function is normal. The right ventricular size is normal. 3. Left atrial size was moderately dilated. 4. The mitral valve is abnormal. Trivial mitral valve regurgitation. No evidence of mitral stenosis. 5. The aortic valve is tricuspid. There is mild calcification of the aortic valve. Aortic valve regurgitation is not visualized. Aortic valve sclerosis is present, with no evidence of aortic valve stenosis. 6. Aortic dilatation noted. There is mild dilatation of the aortic root, measuring 38 mm. 7. The inferior vena cava is normal in size with greater than 50% respiratory variability, suggesting right atrial pressure of 3  mmHg.  FINDINGS Left Ventricle: Left ventricular ejection fraction, by estimation, is 55 to 60%. The left ventricle has normal function. The left ventricle has no regional wall motion abnormalities. The left ventricular internal cavity size was normal in size. There is no left ventricular hypertrophy. Left ventricular diastolic parameters were normal.  Right Ventricle: The right ventricular size is normal. No increase in right ventricular wall thickness. Right ventricular systolic function is normal.  Left Atrium: Left atrial size was moderately dilated.  Right Atrium: Right atrial size was normal in size.  Pericardium: There is no evidence of pericardial effusion.  Mitral Valve: The mitral valve is abnormal. There is mild thickening of the mitral valve leaflet(s). There is mild calcification of the mitral valve leaflet(s). Trivial mitral valve regurgitation. No evidence of mitral valve stenosis.  Tricuspid Valve: The tricuspid valve is normal in structure. Tricuspid valve regurgitation is not demonstrated. No evidence of tricuspid stenosis.  Aortic Valve: The  aortic valve is tricuspid. There is mild calcification of the aortic valve. Aortic valve regurgitation is not visualized. Aortic valve sclerosis is present, with no evidence of aortic valve stenosis.  Pulmonic Valve: The pulmonic valve was normal in structure. Pulmonic valve regurgitation is not visualized. No evidence of pulmonic stenosis.  Aorta: Aortic dilatation noted. There is mild dilatation of the aortic root, measuring 38 mm.  Venous: The inferior vena cava is normal in size with greater than 50% respiratory variability, suggesting right atrial pressure of 3 mmHg.  IAS/Shunts: The interatrial septum was not well visualized.   LEFT VENTRICLE PLAX 2D LVIDd:         5.50 cm   Diastology LVIDs:         4.00 cm   LV e' medial:    5.33 cm/s LV PW:         1.50 cm   LV E/e' medial:  14.0 LV IVS:        1.00 cm   LV e'  lateral:   8.38 cm/s LVOT diam:     2.60 cm   LV E/e' lateral: 8.9 LV SV:         90 LV SV Index:   36 LVOT Area:     5.31 cm  3D Volume EF: 3D EF:        58 % LV EDV:       147 ml LV ESV:       61 ml LV SV:        85 ml  RIGHT VENTRICLE RV S prime:     10.20 cm/s TAPSE (M-mode): 1.5 cm  LEFT ATRIUM           Index        RIGHT ATRIUM           Index LA diam:      5.00 cm 2.01 cm/m   RA Area:     16.20 cm LA Vol (A4C): 73.5 ml 29.48 ml/m  RA Volume:   40.00 ml  16.04 ml/m AORTIC VALVE LVOT Vmax:   80.90 cm/s LVOT Vmean:  57.100 cm/s LVOT VTI:    0.169 m  AORTA Ao Root diam: 3.80 cm Ao Asc diam:  3.60 cm  MITRAL VALVE MV Area (PHT): 3.72 cm    SHUNTS MV Decel Time: 204 msec    Systemic VTI:  0.17 m MV E velocity: 74.70 cm/s  Systemic Diam: 2.60 cm MV A velocity: 78.40 cm/s MV E/A ratio:  0.95  Charlton Haws MD Electronically signed by Charlton Haws MD Signature Date/Time: 05/01/2023/11:46:45 AM    Final   TEE  ECHO INTRAOPERATIVE TEE 11/26/2020  Narrative *INTRAOPERATIVE TRANSESOPHAGEAL REPORT *    Patient Name:   IYAN MARTINES   Date of Exam: 11/26/2020 Medical Rec #:  [id number]      Height:       73.0 in Accession #:    [id number]     Weight:       286.0 lb Date of Birth:  December 18, 1955      BSA:          2.50 m Patient Age:    64 years       BP:           167/92 mmHg Patient Gender: M              HR:           62 bpm. Exam Location:  Anesthesiology  Transesophogeal exam  was perform intraoperatively during surgical procedure. Patient was closely monitored under general anesthesia during the entirety of examination.  Indications:     CAD Native Vessel i25.0 Sonographer:     Irving Burton Senior RDCS Performing Phys: [id number] Alleen Borne Diagnosing Phys: Eilene Ghazi MD  Complications: No known complications during this procedure. POST-OP IMPRESSIONS - Left Ventricle: The left ventricle is unchanged from pre-bypass. - Right Ventricle: The right ventricle  appears unchanged from pre-bypass. - Aorta: The aorta appears unchanged from pre-bypass. - Left Atrium: The left atrium appears unchanged from pre-bypass. - Left Atrial Appendage: The left atrial appendage appears unchanged from pre-bypass. - Aortic Valve: The aortic valve appears unchanged from pre-bypass. - Mitral Valve: The mitral valve appears unchanged from pre-bypass. - Tricuspid Valve: The tricuspid valve appears unchanged from pre-bypass. - Interatrial Septum: The interatrial septum appears unchanged from pre-bypass. - Interventricular Septum: The interventricular septum appears unchanged from pre-bypass. - Pericardium: The pericardium appears unchanged from pre-bypass. - Comments: post bypass EF similar to pre surgery 50-55% No RWMA.  PRE-OP FINDINGS Left Ventricle: The left ventricle has low normal systolic function, with an ejection fraction of 50-55%. The cavity size was normal. There is no increase in left ventricular wall thickness.  Right Ventricle: The right ventricle has normal systolic function. The cavity was normal. There is no increase in right ventricular wall thickness. Right ventricular systolic pressure is normal.  Left Atrium: Left atrial size was normal in size. The left atrial appendage is well visualized and there is no evidence of thrombus present.  Right Atrium: Right atrial size was normal in size. the interatrial septum is seen bowed toward the left, consistent with elevated right atrial pressures.  Interatrial Septum: No atrial level shunt detected by color flow Doppler.  Pericardium: There is no evidence of pericardial effusion.  Mitral Valve: The mitral valve is normal in structure. No thickening of the mitral valve leaflet. No calcification of the mitral valve leaflet. Mitral valve regurgitation is not visualized by color flow Doppler. There is No evidence of mitral stenosis.  Tricuspid Valve: The tricuspid valve was normal in structure. Tricuspid  valve regurgitation is trivial by color flow Doppler.  Aortic Valve: The aortic valve is normal in structure. There is mild thickening of the aortic valve Aortic valve regurgitation is trivial by color flow Doppler. There is no evidence of aortic valve stenosis.  Pulmonic Valve: The pulmonic valve was normal in structure. Pulmonic valve regurgitation is not visualized by color flow Doppler.   Aorta: The aortic root and ascending aorta are normal in size and structure.   +------------------+---------++ LV Volumes (MOD)            +------------------+---------++ LV area d, A4C:   28.10 cm +------------------+---------++ LV area s, A4C:   16.30 cm +------------------+---------++ LV major d, A4C:  8.43 cm   +------------------+---------++ LV major s, A4C:  6.30 cm   +------------------+---------++ LV vol d, MOD A4C:78.7 ml   +------------------+---------++ LV vol s, MOD A4C:36.4 ml   +------------------+---------++ LV SV MOD A4C:    78.7 ml   +------------------+---------++  +-------------+--------++ AORTIC VALVE          +-------------+--------++ AV Mean Grad:5.0 mmHg +-------------+--------++  +------------+-------++ AORTA               +------------+-------++ Ao STJ diam:3.7 cm  +------------+-------++ Ao Asc diam:3.50 cm +------------+-------++   Eilene Ghazi MD Electronically signed by Eilene Ghazi MD Signature Date/Time: 11/26/2020/4:03:16 PM    Final  Recent Labs: 05/01/2023: ALT 65 05/05/2023: Magnesium 2.0 05/19/2023: BUN 11; Creatinine, Ser 0.97; Hemoglobin 16.0; Platelets 244; Potassium 4.3; Sodium 141; TSH 1.110  Recent Lipid Panel    Component Value Date/Time   CHOL 102 04/17/2023 0952   TRIG 161 (H) 04/17/2023 0952   HDL 33 (L) 04/17/2023 0952   CHOLHDL 3.1 04/17/2023 0952   LDLCALC 42 04/17/2023 0952   LDLDIRECT 57 04/26/2021 1100    History of Present Illness  67 year old male with  a past medical history of CAD with remote stents, now S/p CABG x 2 in 11/2020, s/p dual chamber St Jude pacemaker, atrial fibrillation, HTN, and HLD.   Patient reports history of 29 cardiac stents while he was living in [place].  During his catheterization in 2019 he was noted to have pauses which resulted in a dual-chamber [facility] pacemaker implantation. He underwent cardiac catheterization on 09/14/2020 which showed multivessel diffuse stenting, LAD 50% ostial narrowing, OM vessel 99% stenosis with thrombus in the circumflex stent, angioplasty was performed requiring kissing balloon technique with the plaque initially shifting but was successful.  On 11/16/20 he was admitted with unstable angina and subsequently underwent CABG x 2 with LIMA to the LAD and RIMA to diag 1 by Dr. Lavinia Sharps.  Postoperatively he went into atrial fibrillation and was started on amiodarone, converted to sinus rhythm.  Following he was seen by the atrial fibrillation clinic and his amiodarone was eventually stopped in March 2022 given his age and as he had no recurrence of atrial fibrillation.  He was seen back in the A-fib clinic on 05/01/2021 for a short run of atrial fibrillation but possibly related be related to COVID booster.  Previously had been started on Eliquis given his CHA2DS2-VASc score of 3.   He was seen by Dr. Cristal Deer on 09/30/2021, from a cardiac perspective he was doing well at that time although he did report intermittent discomfort in his stomach and chest following eating specific foods or eating too much, pepcid improved symptoms.   On 11/19/2021 provided a remote transmission that indicated a tacky rhythm.  Was seen in clinic on 11/27/21 noted that his palpitations have resolved, directed to take an extra half dose of metoprolol for extended episodes of palpitations.  He was last seen on 12/19/2022 by Dr. Cristal Deer had at that time he reported feeling a little lightheaded and dizzy which she attributed to  sinus issues.  He reported every now and then experiencing "tweaks" which she described as a heart jump or skip.  Stated that his palpitations had occurred throughout his life.  Stated are typically brief lasting 1 to 2 minutes and then do not recur.  He was also concerned about having blood clots in his legs.  Reported he had been having significant leg pain and tenderness in his frontal knees bilaterally.  Reportedly he underwent imaging which was notable for arthritis of bilateral knees.  On 04/28/2023 he presented to the emergency room for worsening pyelonephritis.  He was also found to be hypokalemic at 2.2 and hypomagnesemic at 1.4. His chlorthalidone was discontinued while inpatient and nephrology was consulted for difficulty improving his hypokalemia and hypomagnesemia. 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 While inpatient he had an episode of chest pain, was found to be in afib RVR, given IV metoprolol, with return to sinus rhythm. Per notes his metoprolol had been held due to low BP in setting of infection. Troponins were negative. Echocardiogram on 05/01/23 indicated EF  of 55 to 60%, LV normal function, no RWMA, aortic valve sclerosis was present with no evidence of stenosis. He was restarted on eliquis, unclear when this had been previously stopped. Nephrology started him amiloride 5mg  daily, recommended to avoid thiazide diuretics given recent history.   On 05/13/23 he had a PMT for palpitations, his AT/AF burden was <1%.   On 7/2 and 05/20/23 he presented to his PCP for palpitations and recurrent UTI symptoms. On note review he reported heart palpitations since he was discharged from the hospital on 05/02/23. He becomes cold and clammy, feels weak having to sit, noted three episodes over the lat two weeks. These episodes can occur with exertion or at rest. Denied loss of of consciousness or chest pain.   Labs 05/19/23: Sodium 141, K 4.3, creatinine 0.97, eGFR  86,  WBC 5.8, Hemoglobin 16.0, hematocrit 45.9 TSH 1.10  Palpitations: CAD: Reported remote history of 29 stents, now s/p CABGx2 (LIMA to LAD and right internal mammary to diagonal 1) in 11/2020.  Continue Eliquis, atorvastatin, metoprolol, prasugrel  Atrial fibrillation/S/p St. Jude pacemaker in 2019: Reported as placed due to pauses during catheterization Continue Eliquis and metoprolol tartrate  Hypertension Chlorthalidone stopped during admission 04/2023 for hypokalemia and hypomagnesemia. Started on amiloride by nephrology, recommend no thiazide use in future.  Continue amiloride, metoprolol tartrate  Hyperlipidemia: Last lipid profile indicated total cholesterol of 102, triglycerides 161 and LDL 42.  Continue atorvastatin 80mg  daily   Home Medications    Current Outpatient Medications  Medication Sig Dispense Refill   acetaminophen (TYLENOL) 500 MG tablet Take 2 tablets (1,000 mg total) by mouth every 6 (six) hours as needed. 30 tablet 0   aMILoride (MIDAMOR) 5 MG tablet Take 1 tablet (5 mg total) by mouth daily. 30 tablet 0   apixaban (ELIQUIS) 5 MG TABS tablet Take 1 tablet (5 mg total) by mouth 2 (two) times daily. 60 tablet 0   atorvastatin (LIPITOR) 80 MG tablet TAKE 1 TABLET BY MOUTH EVERYDAY AT BEDTIME (Patient taking differently: Take 80 mg by mouth at bedtime.) 90 tablet 2   cephALEXin (KEFLEX) 500 MG capsule Take 1 capsule (500 mg total) by mouth 4 (four) times daily for 7 days. 28 capsule 0   famotidine (PEPCID) 20 MG tablet Take 1 tablet (20 mg total) by mouth as needed for heartburn or indigestion. 30 tablet 2   metoprolol tartrate (LOPRESSOR) 25 MG tablet TAKE 1 TABLET BY MOUTH TWICE A DAY (Patient taking differently: Take 25 mg by mouth 2 (two) times daily.) 180 tablet 2   pantoprazole (PROTONIX) 40 MG tablet TAKE 1 TABLET BY MOUTH EVERY DAY (Patient taking differently: Take 40 mg by mouth daily.) 90 tablet 1   prasugrel (EFFIENT) 10 MG TABS tablet TAKE 1 TABLET BY MOUTH  EVERY DAY (Patient taking differently: Take 10 mg by mouth daily.) 90 tablet 2   No current facility-administered medications for this visit.     Review of Systems    ***.  All other systems reviewed and are otherwise negative except as noted above.    Physical Exam    VS:  There were no vitals taken for this visit. , BMI There is no height or weight on file to calculate BMI.     GEN: Well nourished, well developed, in no acute distress. HEENT: normal. Neck: Supple, no JVD, carotid bruits, or masses. Cardiac: RRR, no murmurs, rubs, or gallops. No clubbing, cyanosis, edema.  Radials/DP/PT 2+ and equal bilaterally.  Respiratory:  Respirations regular  and unlabored, clear to auscultation bilaterally. GI: Soft, nontender, nondistended, BS + x 4. MS: no deformity or atrophy. Skin: warm and dry, no rash. Neuro:  Strength and sensation are intact. Psych: Normal affect.  Accessory Clinical Findings    ECG personally reviewed by me today -    - no acute changes.   Lab Results  Component Value Date   WBC 5.8 05/19/2023   HGB 16.0 05/19/2023   HCT 45.9 05/19/2023   MCV 89 05/19/2023   PLT 244 05/19/2023   Lab Results  Component Value Date   CREATININE 0.97 05/19/2023   BUN 11 05/19/2023   NA 141 05/19/2023   K 4.3 05/19/2023   CL 103 05/19/2023   CO2 25 05/19/2023   Lab Results  Component Value Date   ALT 65 (H) 05/01/2023   AST 74 (H) 05/01/2023   ALKPHOS 51 05/01/2023   BILITOT 1.2 05/01/2023   Lab Results  Component Value Date   CHOL 102 04/17/2023   HDL 33 (L) 04/17/2023   LDLCALC 42 04/17/2023   LDLDIRECT 57 04/26/2021   TRIG 161 (H) 04/17/2023   CHOLHDL 3.1 04/17/2023    Lab Results  Component Value Date   HGBA1C 5.2 11/25/2020    Assessment & Plan    1.  ***      Rip Harbour, NP 05/21/2023, 3:39 PM

## 2023-05-22 ENCOUNTER — Ambulatory Visit: Payer: Medicare HMO | Admitting: Physician Assistant

## 2023-05-22 DIAGNOSIS — I480 Paroxysmal atrial fibrillation: Secondary | ICD-10-CM

## 2023-05-22 DIAGNOSIS — I2510 Atherosclerotic heart disease of native coronary artery without angina pectoris: Secondary | ICD-10-CM

## 2023-05-22 DIAGNOSIS — Z951 Presence of aortocoronary bypass graft: Secondary | ICD-10-CM

## 2023-05-22 DIAGNOSIS — R002 Palpitations: Secondary | ICD-10-CM

## 2023-05-22 DIAGNOSIS — D6869 Other thrombophilia: Secondary | ICD-10-CM

## 2023-05-22 DIAGNOSIS — I10 Essential (primary) hypertension: Secondary | ICD-10-CM

## 2023-05-24 ENCOUNTER — Other Ambulatory Visit: Payer: Self-pay | Admitting: Student

## 2023-05-27 ENCOUNTER — Other Ambulatory Visit: Payer: Self-pay | Admitting: Student

## 2023-06-01 ENCOUNTER — Ambulatory Visit (INDEPENDENT_AMBULATORY_CARE_PROVIDER_SITE_OTHER): Payer: Medicare HMO

## 2023-06-01 DIAGNOSIS — I480 Paroxysmal atrial fibrillation: Secondary | ICD-10-CM | POA: Diagnosis not present

## 2023-06-01 LAB — CUP PACEART REMOTE DEVICE CHECK
Battery Remaining Longevity: 75 mo
Battery Remaining Percentage: 60 %
Battery Voltage: 2.98 V
Brady Statistic AP VP Percent: 1 %
Brady Statistic AP VS Percent: 1 %
Brady Statistic AS VP Percent: 1 %
Brady Statistic AS VS Percent: 98 %
Brady Statistic RA Percent Paced: 1 %
Brady Statistic RV Percent Paced: 1 %
Date Time Interrogation Session: 20240715032826
Implantable Lead Connection Status: 753985
Implantable Lead Connection Status: 753985
Implantable Lead Implant Date: 20190322
Implantable Lead Implant Date: 20190322
Implantable Lead Location: 753859
Implantable Lead Location: 753860
Implantable Pulse Generator Implant Date: 20190322
Lead Channel Impedance Value: 380 Ohm
Lead Channel Impedance Value: 490 Ohm
Lead Channel Pacing Threshold Amplitude: 1 V
Lead Channel Pacing Threshold Amplitude: 1 V
Lead Channel Pacing Threshold Pulse Width: 0.5 ms
Lead Channel Pacing Threshold Pulse Width: 1 ms
Lead Channel Sensing Intrinsic Amplitude: 0.4 mV
Lead Channel Sensing Intrinsic Amplitude: 6.3 mV
Lead Channel Setting Pacing Amplitude: 2.5 V
Lead Channel Setting Pacing Amplitude: 2.5 V
Lead Channel Setting Pacing Pulse Width: 0.5 ms
Lead Channel Setting Sensing Sensitivity: 2.5 mV
Pulse Gen Model: 2272
Pulse Gen Serial Number: 8996724

## 2023-06-03 ENCOUNTER — Encounter: Payer: Self-pay | Admitting: Urology

## 2023-06-03 ENCOUNTER — Telehealth (HOSPITAL_BASED_OUTPATIENT_CLINIC_OR_DEPARTMENT_OTHER): Payer: Self-pay

## 2023-06-03 ENCOUNTER — Ambulatory Visit: Payer: Medicare HMO | Admitting: Urology

## 2023-06-03 VITALS — BP 135/77 | HR 50 | Ht 73.0 in | Wt 283.0 lb

## 2023-06-03 DIAGNOSIS — R8281 Pyuria: Secondary | ICD-10-CM

## 2023-06-03 DIAGNOSIS — R3915 Urgency of urination: Secondary | ICD-10-CM

## 2023-06-03 DIAGNOSIS — R300 Dysuria: Secondary | ICD-10-CM | POA: Diagnosis not present

## 2023-06-03 DIAGNOSIS — R350 Frequency of micturition: Secondary | ICD-10-CM | POA: Diagnosis not present

## 2023-06-03 DIAGNOSIS — R1030 Lower abdominal pain, unspecified: Secondary | ICD-10-CM | POA: Diagnosis not present

## 2023-06-03 DIAGNOSIS — N390 Urinary tract infection, site not specified: Secondary | ICD-10-CM

## 2023-06-03 DIAGNOSIS — R3916 Straining to void: Secondary | ICD-10-CM

## 2023-06-03 DIAGNOSIS — Z87440 Personal history of urinary (tract) infections: Secondary | ICD-10-CM

## 2023-06-03 DIAGNOSIS — R8271 Bacteriuria: Secondary | ICD-10-CM

## 2023-06-03 LAB — URINALYSIS, ROUTINE W REFLEX MICROSCOPIC
Bilirubin, UA: NEGATIVE
Glucose, UA: NEGATIVE
Ketones, UA: NEGATIVE
Nitrite, UA: NEGATIVE
Protein,UA: NEGATIVE
Specific Gravity, UA: 1.025 (ref 1.005–1.030)
Urobilinogen, Ur: 0.2 mg/dL (ref 0.2–1.0)
pH, UA: 7 (ref 5.0–7.5)

## 2023-06-03 LAB — MICROSCOPIC EXAMINATION: WBC, UA: >30 /hpf — AB (ref 0–5)

## 2023-06-03 MED ORDER — CEFTRIAXONE SODIUM 1 G IJ SOLR
1.0000 g | Freq: Once | INTRAMUSCULAR | Status: AC
Start: 2023-06-03 — End: 2023-06-03
  Administered 2023-06-03: 1 g via INTRAMUSCULAR

## 2023-06-03 MED ORDER — TAMSULOSIN HCL 0.4 MG PO CAPS: 0.4000 mg | ORAL_CAPSULE | Freq: Every day | ORAL | 3 refills | Status: DC

## 2023-06-03 MED ORDER — CEFDINIR 300 MG PO CAPS: 300.0000 mg | ORAL_CAPSULE | Freq: Two times a day (BID) | ORAL | 0 refills | Status: DC

## 2023-06-03 MED ORDER — SULFAMETHOXAZOLE-TRIMETHOPRIM 400-80 MG PO TABS: 1.0000 | ORAL_TABLET | Freq: Every evening | ORAL | 1 refills | Status: DC

## 2023-06-03 NOTE — Progress Notes (Signed)
IM Injection  Patient is present today for an IM Injection for treatment of UTI Drug: Ceftriaxone  Dose:1gram Location:LUOQ Lot: [id number] Exp:01/2025 Patient tolerated well, no complications were noted  Performed by: Christal N., CMA(AAMA)

## 2023-06-03 NOTE — Progress Notes (Signed)
Assessment: 1. Recurrent UTI      Plan: Urine culture today Will begin treatment empirically with Rocephin 1 g IM followed by Omnicef 300 twice daily for 10 days. Following the completion of antibiotics patient will begin low-dose suppression with Bactrim RS 1 p.o. nightly. Will also begin tamsulosin for BPH/lower urinary tract symptoms.  Rx: 0.4 mg daily Will evaluate further with CT urogram and follow-up cystoscopy.  Chief Complaint: UTI  History of Present Illness:  Ryan Clarke is a 67 y.o. male who is seen in consultation from [place], Tacey Ruiz, MD for evaluation of LUTS and h/o febrile UTI/Pyelo. Pyelo 7-8 yrs ago admitted [facility] for a few days.  Never saw urologisit. Recently admitted with ecoli pyelo/febrile UTI at [facility].  Rx with keflex. Renal US normal. Baseline min LUTS. Patient reports the onset of UTI symptoms a few days after stopping the antibiotics.  Currently he has increased frequency urgency and dysuria.  He also reports some episodes of stranguria.  He has not seen any gross hematuria.  Urinalysis today is concerning for infection with significant pyuria and bacteriuria  No known prior PSA testing  Past Medical History:  Past Medical History:  Diagnosis Date   Carpal tunnel syndrome 09/10/2020   Coronary artery disease    Hypertension    Nodular basal cell carcinoma (BCC) 09/04/2021   Left Lower Back    Past Surgical History:  Past Surgical History:  Procedure Laterality Date   CORONARY ARTERY BYPASS GRAFT N/A 11/26/2020   Procedure: CORONARY ARTERY BYPASS GRAFTING (CABG) TIMES TWO USING BILATERAL INTERNAL MAMMARY ARTERIES;  Surgeon: Alleen Borne, MD;  Location: [facility] OR;  Service: Open Heart Surgery;  Laterality: N/A;  BIMA   CORONARY BALLOON ANGIOPLASTY N/A 09/14/2020   Procedure: CORONARY BALLOON ANGIOPLASTY;  Surgeon: Lennette Bihari, MD;  Location: [facility] INVASIVE CV LAB;  Service: Cardiovascular;  Laterality: N/A;   CORONARY STENT PLACEMENT     LEFT HEART  CATH AND CORONARY ANGIOGRAPHY N/A 09/14/2020   Procedure: LEFT HEART CATH AND CORONARY ANGIOGRAPHY;  Surgeon: Lennette Bihari, MD;  Location: [facility] INVASIVE CV LAB;  Service: Cardiovascular;  Laterality: N/A;   LEFT HEART CATH AND CORONARY ANGIOGRAPHY N/A 11/19/2020   Procedure: LEFT HEART CATH AND CORONARY ANGIOGRAPHY;  Surgeon: Lennette Bihari, MD;  Location: [facility] INVASIVE CV LAB;  Service: Cardiovascular;  Laterality: N/A;   PACEMAKER INSERTION     TEE WITHOUT CARDIOVERSION N/A 11/26/2020   Procedure: TRANSESOPHAGEAL ECHOCARDIOGRAM (TEE);  Surgeon: Alleen Borne, MD;  Location: [facility];  Service: Open Heart Surgery;  Laterality: N/A;    Allergies:  No Known Allergies  Family History:  Family History  Problem Relation Age of Onset   Coronary artery disease Mother    Coronary artery disease Father    Coronary artery disease Brother    Coronary artery disease Brother    Liver cancer Maternal Grandmother     Social History:  Social History   Tobacco Use   Smoking status: Never   Smokeless tobacco: Never  Substance Use Topics   Alcohol use: Never   Drug use: Never    Review of symptoms:  Constitutional:  Negative for unexplained weight loss, night sweats, fever, chills ENT:  Negative for nose bleeds, sinus pain, painful swallowing CV:  Negative for chest pain, shortness of breath, exercise intolerance, palpitations, loss of consciousness Resp:  Negative for cough, wheezing, shortness of breath GI:  Negative for nausea, vomiting, diarrhea, bloody stools GU:  Positives noted in HPI; otherwise  negative for gross hematuria, dysuria, urinary incontinence Neuro:  Negative for seizures, poor balance, limb weakness, slurred speech Psych:  Negative for lack of energy, depression, anxiety Endocrine:  Negative for polydipsia, polyuria, symptoms of hypoglycemia (dizziness, hunger, sweating) Hematologic:  Negative for anemia, purpura, petechia, prolonged or excessive bleeding, use of  anticoagulants  Allergic:  Negative for difficulty breathing or choking as a result of exposure to anything; no shellfish allergy; no allergic response (rash/itch) to materials, foods  Physical exam: BP 135/77   Pulse (!) 50   Ht 6\' 1"  (1.854 m)   Wt 283 lb (128.4 kg)   BMI 37.34 kg/m  GENERAL APPEARANCE:  Well appearing, well developed, well nourished, NAD     Results: UA today concerning for infection with significant pyuria and bacteriuria

## 2023-06-08 ENCOUNTER — Telehealth (HOSPITAL_BASED_OUTPATIENT_CLINIC_OR_DEPARTMENT_OTHER): Payer: Self-pay

## 2023-06-08 LAB — URINE CULTURE

## 2023-06-08 MED ORDER — APIXABAN 5 MG PO TABS: 5.0000 mg | ORAL_TABLET | Freq: Two times a day (BID) | ORAL | Status: DC

## 2023-06-08 NOTE — Telephone Encounter (Signed)
06/08/2023 Returned call to patient after locating message in appt requests.  Pt states he is having lower leg edema, denies shortness of breath, for edema anywhere other than his legs.  He states this has been worsening since his discharge from the hospital in June with pyelonephritis.  Pt had episode of Afib with RVR while in the hospital.  Pt was started on Eliquis which he hasn't taken since being discharged from the hospital. Explained the risk of stroke since he has had episodes of Afib in the past.  Pt verbalizes understanding.  Pt reports worsening symptoms of what he perceives is "indigestion."  Discussed patient concerns with Dr. Cristal Deer who recommends if patient is having worsening "indigestion" he needs to consider going to the ER for evaluation.  Offered patient Eliquis samples which he states he will pick up tomorrow and then go to the ER.  Pt is aware if what he perceives as indigestion could be some type of cardiac event. Jim Like MHA RN CCM

## 2023-06-08 NOTE — Addendum Note (Signed)
Addended by: Jim Like D on: 06/08/2023 06:29 PM   Modules accepted: Orders

## 2023-06-10 ENCOUNTER — Other Ambulatory Visit: Payer: Self-pay | Admitting: Cardiology

## 2023-06-14 NOTE — Patient Instructions (Signed)
Ryan Clarke , Thank you for taking time to come for your Medicare Wellness Visit. I appreciate your ongoing commitment to your health goals. Please review the following plan we discussed and let me know if I can assist you in the future.   Referrals/Orders/Follow-Ups/Clinician Recommendations: Aim for 30 minutes of exercise or brisk walking, 6-8 glasses of water, and 5 servings of fruits and vegetables each day.  This is a list of the screening recommended for you and due dates:  Health Maintenance  Topic Date Due   DTaP/Tdap/Td vaccine (1 - Tdap) Never done   Zoster (Shingles) Vaccine (1 of 2) Never done   COVID-19 Vaccine (5 - 2023-24 season) 07/18/2022   Flu Shot  06/18/2023   Medicare Annual Wellness Visit  06/14/2024   Cologuard (Stool DNA test)  12/19/2024   Pneumonia Vaccine  Completed   Hepatitis C Screening  Completed   HPV Vaccine  Aged Out    Advanced directives: (ACP Link)Information on Advanced Care Planning can be found at [facility] Advance Health Care Directives Advance Health Care Directives ([email or web address]   Next Medicare Annual Wellness Visit scheduled for next year: Yes  Preventive Care 65 Years and Older, Male  Preventive care refers to lifestyle choices and visits with your health care provider that can promote health and wellness. What does preventive care include? A yearly physical exam. This is also called an annual well check. Dental exams once or twice a year. Routine eye exams. Ask your health care provider how often you should have your eyes checked. Personal lifestyle choices, including: Daily care of your teeth and gums. Regular physical activity. Eating a healthy diet. Avoiding tobacco and drug use. Limiting alcohol use. Practicing safe sex. Taking low doses of aspirin every day. Taking vitamin and mineral supplements as recommended by your health care provider. What happens during an annual well check? The services and  screenings done by your health care provider during your annual well check will depend on your age, overall health, lifestyle risk factors, and family history of disease. Counseling  Your health care provider may ask you questions about your: Alcohol use. Tobacco use. Drug use. Emotional well-being. Home and relationship well-being. Sexual activity. Eating habits. History of falls. Memory and ability to understand (cognition). Work and work Astronomer. Screening  You may have the following tests or measurements: Height, weight, and BMI. Blood pressure. Lipid and cholesterol levels. These may be checked every 5 years, or more frequently if you are over 35 years old. Skin check. Lung cancer screening. You may have this screening every year starting at age 15 if you have a 30-pack-year history of smoking and currently smoke or have quit within the past 15 years. Fecal occult blood test (FOBT) of the stool. You may have this test every year starting at age 100. Flexible sigmoidoscopy or colonoscopy. You may have a sigmoidoscopy every 5 years or a colonoscopy every 10 years starting at age 76. Prostate cancer screening. Recommendations will vary depending on your family history and other risks. Hepatitis C blood test. Hepatitis B blood test. Sexually transmitted disease (STD) testing. Diabetes screening. This is done by checking your blood sugar (glucose) after you have not eaten for a while (fasting). You may have this done every 1-3 years. Abdominal aortic aneurysm (AAA) screening. You may need this if you are a current or former smoker. Osteoporosis. You may be screened starting at age 64 if you are at high risk. Talk with your health  care provider about your test results, treatment options, and if necessary, the need for more tests. Vaccines  Your health care provider may recommend certain vaccines, such as: Influenza vaccine. This is recommended every year. Tetanus, diphtheria, and  acellular pertussis (Tdap, Td) vaccine. You may need a Td booster every 10 years. Zoster vaccine. You may need this after age 61. Pneumococcal 13-valent conjugate (PCV13) vaccine. One dose is recommended after age 59. Pneumococcal polysaccharide (PPSV23) vaccine. One dose is recommended after age 100. Talk to your health care provider about which screenings and vaccines you need and how often you need them. This information is not intended to replace advice given to you by your health care provider. Make sure you discuss any questions you have with your health care provider. Document Released: 11/30/2015 Document Revised: 07/23/2016 Document Reviewed: 09/04/2015 Elsevier Interactive Patient Education  2017 [employer].  Fall Prevention in the Home Falls can cause injuries. They can happen to people of all ages. There are many things you can do to make your home safe and to help prevent falls. What can I do on the outside of my home? Regularly fix the edges of walkways and driveways and fix any cracks. Remove anything that might make you trip as you walk through a door, such as a raised step or threshold. Trim any bushes or trees on the path to your home. Use bright outdoor lighting. Clear any walking paths of anything that might make someone trip, such as rocks or tools. Regularly check to see if handrails are loose or broken. Make sure that both sides of any steps have handrails. Any raised decks and porches should have guardrails on the edges. Have any leaves, snow, or ice cleared regularly. Use sand or salt on walking paths during winter. Clean up any spills in your garage right away. This includes oil or grease spills. What can I do in the bathroom? Use night lights. Install grab bars by the toilet and in the tub and shower. Do not use towel bars as grab bars. Use non-skid mats or decals in the tub or shower. If you need to sit down in the shower, use a plastic, non-slip stool. Keep  the floor dry. Clean up any water that spills on the floor as soon as it happens. Remove soap buildup in the tub or shower regularly. Attach bath mats securely with double-sided non-slip rug tape. Do not have throw rugs and other things on the floor that can make you trip. What can I do in the bedroom? Use night lights. Make sure that you have a light by your bed that is easy to reach. Do not use any sheets or blankets that are too big for your bed. They should not hang down onto the floor. Have a firm chair that has side arms. You can use this for support while you get dressed. Do not have throw rugs and other things on the floor that can make you trip. What can I do in the kitchen? Clean up any spills right away. Avoid walking on wet floors. Keep items that you use a lot in easy-to-reach places. If you need to reach something above you, use a strong step stool that has a grab bar. Keep electrical cords out of the way. Do not use floor polish or wax that makes floors slippery. If you must use wax, use non-skid floor wax. Do not have throw rugs and other things on the floor that can make you trip. What can  I do with my stairs? Do not leave any items on the stairs. Make sure that there are handrails on both sides of the stairs and use them. Fix handrails that are broken or loose. Make sure that handrails are as long as the stairways. Check any carpeting to make sure that it is firmly attached to the stairs. Fix any carpet that is loose or worn. Avoid having throw rugs at the top or bottom of the stairs. If you do have throw rugs, attach them to the floor with carpet tape. Make sure that you have a light switch at the top of the stairs and the bottom of the stairs. If you do not have them, ask someone to add them for you. What else can I do to help prevent falls? Wear shoes that: Do not have high heels. Have rubber bottoms. Are comfortable and fit you well. Are closed at the toe. Do not  wear sandals. If you use a stepladder: Make sure that it is fully opened. Do not climb a closed stepladder. Make sure that both sides of the stepladder are locked into place. Ask someone to hold it for you, if possible. Clearly mark and make sure that you can see: Any grab bars or handrails. First and last steps. Where the edge of each step is. Use tools that help you move around (mobility aids) if they are needed. These include: Canes. Walkers. Scooters. Crutches. Turn on the lights when you go into a dark area. Replace any light bulbs as soon as they burn out. Set up your furniture so you have a clear path. Avoid moving your furniture around. If any of your floors are uneven, fix them. If there are any pets around you, be aware of where they are. Review your medicines with your doctor. Some medicines can make you feel dizzy. This can increase your chance of falling. Ask your doctor what other things that you can do to help prevent falls. This information is not intended to replace advice given to you by your health care provider. Make sure you discuss any questions you have with your health care provider. Document Released: 08/30/2009 Document Revised: 04/10/2016 Document Reviewed: 12/08/2014 Elsevier Interactive Patient Education  2017 [employer].

## 2023-06-14 NOTE — Progress Notes (Signed)
Subjective:   Ryan Clarke is a 67 y.o. male who presents for an Initial Medicare Annual Wellness Visit.  Visit Complete: Virtual  I connected with  Ryan Clarke on 06/15/23 by a audio enabled telemedicine application and verified that I am speaking with the correct person using two identifiers.  Patient Location: Home  Provider Location: Home Office  I discussed the limitations of evaluation and management by telemedicine. The patient expressed understanding and agreed to proceed.  Patient Medicare AWV questionnaire was completed by the patient on 06/11/23; I have confirmed that all information answered by patient is correct and no changes since this date.  Vital Signs: Per patient no change in vitals since last visit.  Review of Systems     Cardiac Risk Factors include: advanced age (>46men, >62 women);male gender;hypertension     Objective:    Today's Vitals   06/15/23 1310  Weight: 283 lb (128.4 kg)  Height: 6\' 1"  (1.854 m)   Body mass index is 37.34 kg/m.     06/15/2023    1:19 PM 05/20/2023    8:30 AM 04/28/2023    4:36 PM 04/28/2023   10:38 AM 04/27/2023    2:43 PM 04/17/2023    8:45 AM 12/03/2022    8:30 AM  Advanced Directives  Does Patient Have a Medical Advance Directive? No No  No No No No  Would patient like information on creating a medical advance directive? Yes (MAU/Ambulatory/Procedural Areas - Information given) No - Patient declined No - Patient declined  No - Patient declined No - Patient declined No - Patient declined    Current Medications (verified) Outpatient Encounter Medications as of 06/15/2023  Medication Sig   acetaminophen (TYLENOL) 500 MG tablet Take 2 tablets (1,000 mg total) by mouth every 6 (six) hours as needed.   aMILoride (MIDAMOR) 5 MG tablet TAKE 1 TABLET (5 MG TOTAL) BY MOUTH DAILY.   apixaban (ELIQUIS) 5 MG TABS tablet Take 1 tablet (5 mg total) by mouth 2 (two) times daily for 14 days.   atorvastatin (LIPITOR) 80 MG tablet TAKE 1  TABLET BY MOUTH EVERYDAY AT BEDTIME (Patient taking differently: Take 80 mg by mouth at bedtime.)   cefdinir (OMNICEF) 300 MG capsule Take 1 capsule (300 mg total) by mouth 2 (two) times daily.   famotidine (PEPCID) 20 MG tablet Take 1 tablet (20 mg total) by mouth as needed for heartburn or indigestion.   metoprolol tartrate (LOPRESSOR) 25 MG tablet TAKE 1 TABLET BY MOUTH TWICE A DAY   pantoprazole (PROTONIX) 40 MG tablet TAKE 1 TABLET BY MOUTH EVERY DAY (Patient taking differently: Take 40 mg by mouth daily.)   prasugrel (EFFIENT) 10 MG TABS tablet TAKE 1 TABLET BY MOUTH EVERY DAY (Patient taking differently: Take 10 mg by mouth daily.)   sulfamethoxazole-trimethoprim (BACTRIM) 400-80 MG tablet Take 1 tablet by mouth at bedtime.   tamsulosin (FLOMAX) 0.4 MG CAPS capsule Take 1 capsule (0.4 mg total) by mouth daily after supper.   No facility-administered encounter medications on file as of 06/15/2023.    Allergies (verified) Patient has no allergy information on record.   History: Past Medical History:  Diagnosis Date   Carpal tunnel syndrome 09/10/2020   Coronary artery disease    Hypertension    Nodular basal cell carcinoma (BCC) 09/04/2021   Left Lower Back   Past Surgical History:  Procedure Laterality Date   CORONARY ARTERY BYPASS GRAFT N/A 11/26/2020   Procedure: CORONARY ARTERY BYPASS GRAFTING (CABG) TIMES TWO USING  BILATERAL INTERNAL MAMMARY ARTERIES;  Surgeon: Alleen Borne, MD;  Location: [facility] OR;  Service: Open Heart Surgery;  Laterality: N/A;  BIMA   CORONARY BALLOON ANGIOPLASTY N/A 09/14/2020   Procedure: CORONARY BALLOON ANGIOPLASTY;  Surgeon: Lennette Bihari, MD;  Location: [facility] INVASIVE CV LAB;  Service: Cardiovascular;  Laterality: N/A;   CORONARY STENT PLACEMENT     LEFT HEART CATH AND CORONARY ANGIOGRAPHY N/A 09/14/2020   Procedure: LEFT HEART CATH AND CORONARY ANGIOGRAPHY;  Surgeon: Lennette Bihari, MD;  Location: [facility] INVASIVE CV LAB;  Service: Cardiovascular;   Laterality: N/A;   LEFT HEART CATH AND CORONARY ANGIOGRAPHY N/A 11/19/2020   Procedure: LEFT HEART CATH AND CORONARY ANGIOGRAPHY;  Surgeon: Lennette Bihari, MD;  Location: [facility] INVASIVE CV LAB;  Service: Cardiovascular;  Laterality: N/A;   PACEMAKER INSERTION     TEE WITHOUT CARDIOVERSION N/A 11/26/2020   Procedure: TRANSESOPHAGEAL ECHOCARDIOGRAM (TEE);  Surgeon: Alleen Borne, MD;  Location: [facility] OR;  Service: Open Heart Surgery;  Laterality: N/A;   Family History  Problem Relation Age of Onset   Coronary artery disease Mother    Coronary artery disease Father    Coronary artery disease Brother    Coronary artery disease Brother    Liver cancer Maternal Grandmother    Social History   Socioeconomic History   Marital status: Legally Separated    Spouse name: Not on file   Number of children: Not on file   Years of education: Not on file   Highest education level: Bachelor's degree (e.g., BA, AB, BS)  Occupational History   Not on file  Tobacco Use   Smoking status: Never   Smokeless tobacco: Never  Substance and Sexual Activity   Alcohol use: Never   Drug use: Never   Sexual activity: Not on file  Other Topics Concern   Not on file  Social History Narrative   Not on file   Social Determinants of Health   Financial Resource Strain: Medium Risk (06/11/2023)   Overall Financial Resource Strain (CARDIA)    Difficulty of Paying Living Expenses: Somewhat hard  Food Insecurity: Food Insecurity Present (06/11/2023)   Hunger Vital Sign    Worried About Running Out of Food in the Last Year: Sometimes true    Ran Out of Food in the Last Year: Never true  Transportation Needs: No Transportation Needs (06/11/2023)   PRAPARE - Administrator, Civil Service (Medical): No    Lack of Transportation (Non-Medical): No  Physical Activity: Sufficiently Active (06/11/2023)   Exercise Vital Sign    Days of Exercise per Week: 7 days    Minutes of Exercise per Session: 60 min  Stress:  No Stress Concern Present (06/11/2023)   [employer] of Occupational Health - Occupational Stress Questionnaire    Feeling of Stress : Only a little  Social Connections: Moderately Isolated (06/11/2023)   Social Connection and Isolation Panel [NHANES]    Frequency of Communication with Friends and Family: Once a week    Frequency of Social Gatherings with Friends and Family: Once a week    Attends Religious Services: More than 4 times per year    Active Member of [employer] or Organizations: No    Attends Banker Meetings: Never    Marital Status: Living with partner    Tobacco Counseling Counseling given: Not Answered   Clinical Intake:  Pre-visit preparation completed: Yes  Pain : No/denies pain     Diabetes: No  How often  do you need to have someone help you when you read instructions, pamphlets, or other written materials from your doctor or pharmacy?: 1 - Never  Interpreter Needed?: No  Information entered by :: Kandis Fantasia LPN   Activities of Daily Living    06/11/2023    1:48 PM 04/28/2023    5:44 PM  In your present state of health, do you have any difficulty performing the following activities:  Hearing? 0 0  Vision? 0 0  Difficulty concentrating or making decisions? 0 0  Walking or climbing stairs? 0 1  Dressing or bathing? 0 0  Doing errands, shopping? 0   Preparing Food and eating ? N   Using the Toilet? N   In the past six months, have you accidently leaked urine? N   Do you have problems with loss of bowel control? N   Managing your Medications? N   Managing your Finances? N   Housekeeping or managing your Housekeeping? N     Patient Care Team: Lorayne Bender, MD as PCP - General Jodelle Red, MD as PCP - Cardiology (Cardiology) Littie Deeds, MD (Inactive) as PCP - Family Medicine (Family Medicine) Janalyn Harder, MD (Inactive) as Consulting Physician (Dermatology) Joline Maxcy, MD as Referring Physician  (Urology) [facility] Samson Frederic, MD as Consulting Physician (Orthopedic Surgery)  Indicate any recent Medical Services you may have received from other than [facility] providers in the past year (date may be approximate).     Assessment:   This is a routine wellness examination for Diontae.  Hearing/Vision screen Hearing Screening - Comments:: Denies hearing difficulties   Vision Screening - Comments:: Wears rx glasses - up to date with routine eye exams with [facility]    Dietary issues and exercise activities discussed:     Goals Addressed             This Visit's Progress    Remain active and independent         Depression Screen    06/15/2023    1:16 PM 05/20/2023    8:30 AM 05/19/2023    2:35 PM 05/05/2023    2:53 PM 04/27/2023    2:42 PM 04/17/2023    8:45 AM 12/03/2022    8:30 AM  PHQ 2/9 Scores  PHQ - 2 Score 0 0 0 0 0 0 0  PHQ- 9 Score 0 0 2 0 1 3 3     Fall Risk    06/11/2023    1:48 PM 05/20/2023    8:30 AM 05/05/2023    2:53 PM 04/27/2023    2:42 PM 04/17/2023    8:45 AM  Fall Risk   Falls in the past year? 0 0 0 0 0  Number falls in past yr: 0 0 0 0   Injury with Fall? 0 0 0 0     MEDICARE RISK AT HOME:   TIMED UP AND GO:  Was the test performed? No    Cognitive Function:        06/15/2023    1:19 PM  6CIT Screen  What Year? 0 points  What month? 0 points  What time? 0 points  Count back from 20 0 points  Months in reverse 0 points  Repeat phrase 0 points  Total Score 0 points    Immunizations Immunization History  Administered Date(s) Administered   Fluad Quad(high Dose 65+) 12/10/2021, 12/03/2022   Influenza,inj,Quad PF,6+ Mos 09/10/2020   PFIZER Comirnaty(Gray Top)Covid-19 Tri-Sucrose Vaccine 04/26/2021  PFIZER(Purple Top)SARS-COV-2 Vaccination 03/17/2020, 03/17/2020, 11/13/2020   PNEUMOCOCCAL CONJUGATE-20 04/26/2021    TDAP status: Due, Education has been provided regarding the importance of this  vaccine. Advised may receive this vaccine at local pharmacy or Health Dept. Aware to provide a copy of the vaccination record if obtained from local pharmacy or Health Dept. Verbalized acceptance and understanding.  Pneumococcal vaccine status: Up to date  Covid-19 vaccine status: Information provided on how to obtain vaccines.   Qualifies for Shingles Vaccine? Yes   Zostavax completed No   Shingrix Completed?: No.    Education has been provided regarding the importance of this vaccine. Patient has been advised to call insurance company to determine out of pocket expense if they have not yet received this vaccine. Advised may also receive vaccine at local pharmacy or Health Dept. Verbalized acceptance and understanding.  Screening Tests Health Maintenance  Topic Date Due   DTaP/Tdap/Td (1 - Tdap) Never done   Zoster Vaccines- Shingrix (1 of 2) Never done   COVID-19 Vaccine (5 - 2023-24 season) 07/18/2022   INFLUENZA VACCINE  06/18/2023   Medicare Annual Wellness (AWV)  06/14/2024   Fecal DNA (Cologuard)  12/19/2024   Pneumonia Vaccine 2+ Years old  Completed   Hepatitis C Screening  Completed   HPV VACCINES  Aged Out    Health Maintenance  Health Maintenance Due  Topic Date Due   DTaP/Tdap/Td (1 - Tdap) Never done   Zoster Vaccines- Shingrix (1 of 2) Never done   COVID-19 Vaccine (5 - 2023-24 season) 07/18/2022    Colorectal cancer screening: Type of screening: Cologuard. Completed 12/19/21. Repeat every 3 years  Lung Cancer Screening: (Low Dose CT Chest recommended if Age 52-80 years, 20 pack-year currently smoking OR have quit w/in 15years.) does not qualify.   Lung Cancer Screening Referral: n/a  Additional Screening:  Hepatitis C Screening: does qualify; Completed 09/10/20  Vision Screening: Recommended annual ophthalmology exams for early detection of glaucoma and other disorders of the eye. Is the patient up to date with their annual eye exam?  Yes  Who is the  provider or what is the name of the office in which the patient attends annual eye exams? [facility] If pt is not established with a provider, would they like to be referred to a provider to establish care? No .   Dental Screening: Recommended annual dental exams for proper oral hygiene  Community Resource Referral / Chronic Care Management: CRR required this visit?  No   CCM required this visit?  No    Plan:     I have personally reviewed and noted the following in the patient's chart:   Medical and social history Use of alcohol, tobacco or illicit drugs  Current medications and supplements including opioid prescriptions. Patient is not currently taking opioid prescriptions. Functional ability and status Nutritional status Physical activity Advanced directives List of other physicians Hospitalizations, surgeries, and ER visits in previous 12 months Vitals Screenings to include cognitive, depression, and falls Referrals and appointments  In addition, I have reviewed and discussed with patient certain preventive protocols, quality metrics, and best practice recommendations. A written personalized care plan for preventive services as well as general preventive health recommendations were provided to patient.     Kandis Fantasia [place], [place]   2/70/3500   After Visit Summary: (MyChart) Due to this being a telephonic visit, the after visit summary with patients personalized plan was offered to patient via MyChart   Nurse Notes: No concerns  at this time

## 2023-06-15 ENCOUNTER — Ambulatory Visit: Payer: Medicare HMO

## 2023-06-15 VITALS — Ht 73.0 in | Wt 283.0 lb

## 2023-06-15 DIAGNOSIS — Z0000 Encounter for general adult medical examination without abnormal findings: Secondary | ICD-10-CM

## 2023-06-15 NOTE — Progress Notes (Signed)
Remote pacemaker transmission.   

## 2023-06-17 ENCOUNTER — Ambulatory Visit (HOSPITAL_BASED_OUTPATIENT_CLINIC_OR_DEPARTMENT_OTHER): Payer: Medicare HMO | Attending: Urology

## 2023-06-17 ENCOUNTER — Encounter (HOSPITAL_BASED_OUTPATIENT_CLINIC_OR_DEPARTMENT_OTHER): Payer: Self-pay

## 2023-06-19 ENCOUNTER — Encounter (HOSPITAL_BASED_OUTPATIENT_CLINIC_OR_DEPARTMENT_OTHER): Payer: Self-pay | Admitting: Cardiology

## 2023-06-19 ENCOUNTER — Ambulatory Visit (HOSPITAL_BASED_OUTPATIENT_CLINIC_OR_DEPARTMENT_OTHER): Payer: Medicare HMO | Admitting: Cardiology

## 2023-06-19 VITALS — BP 110/76 | HR 59 | Ht 73.0 in | Wt 289.5 lb

## 2023-06-19 DIAGNOSIS — Z951 Presence of aortocoronary bypass graft: Secondary | ICD-10-CM | POA: Diagnosis not present

## 2023-06-19 DIAGNOSIS — I480 Paroxysmal atrial fibrillation: Secondary | ICD-10-CM

## 2023-06-19 DIAGNOSIS — R600 Localized edema: Secondary | ICD-10-CM | POA: Diagnosis not present

## 2023-06-19 DIAGNOSIS — I2510 Atherosclerotic heart disease of native coronary artery without angina pectoris: Secondary | ICD-10-CM

## 2023-06-19 DIAGNOSIS — E7800 Pure hypercholesterolemia, unspecified: Secondary | ICD-10-CM

## 2023-06-19 DIAGNOSIS — D6869 Other thrombophilia: Secondary | ICD-10-CM

## 2023-06-19 MED ORDER — FUROSEMIDE 40 MG PO TABS
40.0000 mg | ORAL_TABLET | Freq: Every day | ORAL | 11 refills | Status: DC | PRN
Start: 2023-06-19 — End: 2024-06-07

## 2023-06-19 NOTE — Progress Notes (Signed)
Cardiology Office Note:  .    Date:  06/19/2023  ID:  Ryan Clarke, DOB Dec 05, 1955, MRN [id number] PCP: Ryan Bender, MD  [facility] Providers Cardiologist:  Ryan Red, MD     History of Present Illness: Marland Kitchen    Ryan Clarke is a 67 y.o. male with a hx of CAD with many stents remotely, now s/p CABGx2 11/2020, pacemaker, who is seen for close follow up. I initially met him 08/14/20 as a referral for palpitations, but on discussion discovered extensive CAD history and angina.   Cardiac history: Had all of his prior cardiac care as part of [facility] in [place]. Not available in Care Everywhere. Reports history of 29 stents, last stent 06/2019. Moved to [place] in 05/2020. S/P CABGx2 by Dr. Laneta Clarke on 11/26/20, complicated by post op atrial fibrillation. Due to recurrence of paroxysmal atrial fib, continued on anticoagulation.   At his visit 12/2022, he complained of occasional "tweaks" of palpitations that have been life-long. He was feeling mildly lightheaded and dizzy which he attributed to sinus issues. He reported an increase in home blood pressures to the 120's systolic (previously averaging 105-114). He was in constant pain (8/10 severity) in his bilateral hands; followed by orthopedics considering cortisone injections vs. carpal tunnel surgery. Additionally complained of significant leg pain, and tenderness in his frontal knees bilaterally. 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 He hadn't been able to procure Eliquis due to cost, remained compliant with prasugrel.  He was admitted to the hospital 04/28/2023 with pyelonephritis; notes reviewed. Blood work in clinic with potassium 2.8, bilirubin 5.6. BP mildly soft on admission. He was given Rocephin with resolution of symptoms by the next day. He was transitioned to cefadroxil for a total 6-day antibiotic course at discharge. Urine studies were evaluated by nephrology and showed fractional  excretion of potassium at 11% indicating tubular wasting. Chlorthalidone was permanently discontinued and not advised to use in the future. Admission was complicated by episode of Afib with RVR associated with chest pain and diaphoresis, confirmed by EKG. Given IV metoprolol with good rate control and symptom resolution. He was transitioned to home metoprolol dosing without further events.  He was initiated on Eliquis for his A-fib, received pharmacy assistance during hospitalization.  Today, he is accompanied by his wife. He states that he has been sick since the end of May, initially with sinus infection and UTI. We reviewed his recent hospitalization. Since discharge, he has been feeling very fatigued with malaise and daytime somnolence. Feels weak with no energy.   At this time he is still on Bactrim, amiloride, and Flomax. When lying down at night especially he feels more congested. No cough or sputum production. He complains of increased ankle swelling bilaterally and feels he is retaining fluid. Has had difficulty breathing while lying down at night. His weight has increased by 6 lbs since he was discharged. He denies adequate urine production; urine output was better on chlorthalidone.   After taking medications and eating, he experiences severe indigestion and belching when completing some kind of physical activity. Pain improves with belching. Has had frequent diarrhea. He has split up his pills, taking his medications throughout the morning and not all at once, which seems to be helping. He does take the famotidine and Protonix every day.  He notes more lower blood pressures at home recently such as 95 systolic. In the office his BP is 110/76.  He denies any palpitations, lightheadedness, headaches, syncope,  or PND.  ROS:  Please see the history of present illness. ROS otherwise negative except as noted.  (+) Fatigue/Malaise (+) Daytime somnolence (+) Weakness (+) Congestion (+) Bilateral  LE edema (+) Orthopnea (+) Severe indigestion with belching, acid reflux (+) Diarrhea  Studies Reviewed: Marland Kitchen    EKG Interpretation Date/Time:  Friday June 19 2023 14:09:40 EDT Ventricular Rate:  59 PR Interval:  212 QRS Duration:  162 QT Interval:  466 QTC Calculation: 461 R Axis:   2  Text Interpretation: Sinus bradycardia with 1st degree A-V block Right bundle branch block Inferior infarct (cited on or before 19-Jun-2023) When compared with ECG of 19-May-2023 15:12, No significant change was found Confirmed by Ryan Clarke [phone]) on 06/19/2023 2:22:49 PM    Echo  05/01/2023: Sonographer Comments: Suboptimal parasternal window and no subcostal  window.  IMPRESSIONS   1. Left ventricular ejection fraction, by estimation, is 55 to 60%. The  left ventricle has normal function. The left ventricle has no regional  wall motion abnormalities. Left ventricular diastolic parameters were  normal.   2. Right ventricular systolic function is normal. The right ventricular  size is normal.   3. Left atrial size was moderately dilated.   4. The mitral valve is abnormal. Trivial mitral valve regurgitation. No  evidence of mitral stenosis.   5. The aortic valve is tricuspid. There is mild calcification of the  aortic valve. Aortic valve regurgitation is not visualized. Aortic valve  sclerosis is present, with no evidence of aortic valve stenosis.   6. Aortic dilatation noted. There is mild dilatation of the aortic root,  measuring 38 mm.   7. The inferior vena cava is normal in size with greater than 50%  respiratory variability, suggesting right atrial pressure of 3 mmHg.   Physical Exam:    VS:  BP 110/76 (BP Location: Left Arm, Patient Position: Sitting, Cuff Size: Large)   Pulse (!) 59   Ht 6\' 1"  (1.854 m)   Wt 289 lb 8 oz (131.3 kg)   BMI 38.19 kg/m    Wt Readings from Last 3 Encounters:  06/19/23 289 lb 8 oz (131.3 kg)  06/15/23 283 lb (128.4 kg)  06/03/23 283 lb  (128.4 kg)    GEN: Well nourished, well developed in no acute distress HEENT: Normal, moist mucous membranes NECK: No JVD CARDIAC: regular rhythm, normal S1 and S2, no rubs or gallops. No murmur. VASCULAR: Radial and DP pulses 2+ bilaterally. No carotid bruits RESPIRATORY:  Clear to auscultation without rales, wheezing or rhonchi  ABDOMEN: Soft, non-tender, non-distended MUSCULOSKELETAL:  Ambulates independently SKIN: Warm and dry, 2+ LE edema pitting and non-pitting.  NEUROLOGIC:  Alert and oriented x 3. No focal neuro deficits noted. PSYCHIATRIC:  Normal affect   ASSESSMENT AND PLAN: .    Bilateral LE edema -start lasix daily for 5 days, then recheck BMET and mg -elevated legs as much as possible -contact us if swelling does not improve  Coronary artery disease, with reported 29 prior stents in [place] (no records), now s/p 2V CABG 11/2020 -no recent angina -instructed on Clarke flag warning signs that need immediate medical attention -continue prasugrel + apixaban vs. Change to clopidogrel in the future. We discussed, doing well on current regimen, continue prasugrel at this time -continue atorvastatin -continue metoprolol. 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 Difficult to afford apixaban, waiting on patient assistance. Consider generic pradaxa if this can be located   Hypertension: goal <130/80 -continue chlorthalidone -Had prior leg swelling on amlodipine. -continue metoprolol   Hyperlipidemia: -LDL goal <70, LDL 55 in 2023 per [facility] -continue atorvastatin   Cardiac risk counseling and prevention recommendations: -recommend heart healthy/Mediterranean diet, with whole grains, fruits, vegetable, fish, lean meats, nuts, and olive oil. Limit salt. -recommend  moderate walking, 3-5 times/week for 30-50 minutes each session. Aim for at least 150 minutes.week. Goal should be pace of 3 miles/hours, or walking 1.5 miles in 30 minutes -recommend avoidance of tobacco products. Avoid excess alcohol.  Dispo: Follow-up in 3-4 weeks, or sooner as needed.  I,Mathew Stumpf,acting as a Neurosurgeon for [employer], MD.,have documented all relevant documentation on the behalf of Ryan Red, MD,as directed by  Ryan Red, MD while in the presence of Ryan Red, MD.  I, Ryan Red, MD, have reviewed all documentation for this visit. The documentation on 08/05/23 for the exam, diagnosis, procedures, and orders are all accurate and complete.   Signed, Ryan Red, MD

## 2023-06-19 NOTE — Patient Instructions (Signed)
Medication Instructions:  We are going to start lasix once a day for five days. Early next week, come to the lab and get your kidney function checked. Try to elevate your legs as much as you can. If things get worse, call us.  *If you need a refill on your cardiac medications before your next appointment, please call your pharmacy*   Lab Work: BMET and Magnesium early next week  If you have any lab test that is abnormal or we need to change your treatment, we will call you to review the results.   Testing/Procedures: None   Follow-Up: At [facility], you and your health needs are our priority.  As part of our continuing mission to provide you with exceptional heart care, we have created designated Provider Care Teams.  These Care Teams include your primary Cardiologist (physician) and Advanced Practice Providers (APPs -  Physician Assistants and Nurse Practitioners) who all work together to provide you with the care you need, when you need it.  We recommend signing up for the patient portal called "MyChart".  Sign up information is provided on this After Visit Summary.  MyChart is used to connect with patients for Virtual Visits (Telemedicine).  Patients are able to view lab/test results, encounter notes, upcoming appointments, etc.  Non-urgent messages can be sent to your provider as well.   To learn more about what you can do with MyChart, go to [email or web address].    Your next appointment:   3 to 4 week(s)  Provider:   Jodelle Red, MD    Other Instructions Continue taking Eliquis

## 2023-06-24 ENCOUNTER — Other Ambulatory Visit: Payer: Medicare HMO | Admitting: Urology

## 2023-06-25 ENCOUNTER — Other Ambulatory Visit: Payer: Medicare HMO | Admitting: Urology

## 2023-06-25 ENCOUNTER — Telehealth: Payer: Self-pay | Admitting: Urology

## 2023-06-25 NOTE — Telephone Encounter (Signed)
Pt called to r.s appt. Gave patient incorrect date and time. Cancelled appt and LVM for pt to call back and r/s

## 2023-07-02 ENCOUNTER — Encounter (HOSPITAL_BASED_OUTPATIENT_CLINIC_OR_DEPARTMENT_OTHER): Payer: Self-pay

## 2023-07-02 NOTE — Telephone Encounter (Signed)
Patient following up on lab results as requested

## 2023-07-03 ENCOUNTER — Ambulatory Visit (HOSPITAL_BASED_OUTPATIENT_CLINIC_OR_DEPARTMENT_OTHER): Payer: Medicare HMO | Admitting: Cardiology

## 2023-07-13 ENCOUNTER — Ambulatory Visit (HOSPITAL_BASED_OUTPATIENT_CLINIC_OR_DEPARTMENT_OTHER): Payer: Medicare HMO | Admitting: Cardiology

## 2023-07-13 ENCOUNTER — Encounter (HOSPITAL_BASED_OUTPATIENT_CLINIC_OR_DEPARTMENT_OTHER): Payer: Self-pay | Admitting: Cardiology

## 2023-07-13 VITALS — BP 107/73 | HR 55 | Ht 73.0 in | Wt 288.4 lb

## 2023-07-13 DIAGNOSIS — Z7901 Long term (current) use of anticoagulants: Secondary | ICD-10-CM

## 2023-07-13 DIAGNOSIS — I2510 Atherosclerotic heart disease of native coronary artery without angina pectoris: Secondary | ICD-10-CM

## 2023-07-13 DIAGNOSIS — R600 Localized edema: Secondary | ICD-10-CM | POA: Diagnosis not present

## 2023-07-13 DIAGNOSIS — Z951 Presence of aortocoronary bypass graft: Secondary | ICD-10-CM

## 2023-07-13 DIAGNOSIS — E7800 Pure hypercholesterolemia, unspecified: Secondary | ICD-10-CM

## 2023-07-13 DIAGNOSIS — D6869 Other thrombophilia: Secondary | ICD-10-CM

## 2023-07-13 DIAGNOSIS — I480 Paroxysmal atrial fibrillation: Secondary | ICD-10-CM | POA: Diagnosis not present

## 2023-07-13 NOTE — Progress Notes (Signed)
Cardiology Office Note:  .    Date:  07/13/2023  ID:  Seung Schultheis, DOB [birth year], MRN [id number] PCP: Lorayne Bender, MD  [facility] Providers Cardiologist:  Jodelle Red, MD     History of Present Illness: Marland Kitchen    Ryan Clarke is a 67 y.o. male with a hx of CAD with many stents remotely, now s/p CABGx2 11/2020, pacemaker, who is seen for close follow up. I initially met him 08/14/20 as a referral for palpitations, but on discussion discovered extensive CAD history and angina.   Cardiac history: Had all of his prior cardiac care as part of [facility] in [place]. Not available in Care Everywhere. Reports history of 29 stents, last stent 06/2019. Moved to [place] in 05/2020. S/P CABGx2 by Dr. Laneta Simmers on 11/26/20, complicated by post op atrial fibrillation. Due to recurrence of paroxysmal atrial fib, continued on anticoagulation.   At his visit 12/2022, he complained of occasional "tweaks" of palpitations that have been life-long. He was feeling mildly lightheaded and dizzy which he attributed to sinus issues. He reported an increase in home blood pressures to the 120's systolic (previously averaging 105-114). He was in constant pain (8/10 severity) in his bilateral hands; followed by orthopedics considering cortisone injections vs. carpal tunnel surgery. Additionally complained of significant leg pain, and tenderness in his frontal knees bilaterally. 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 He hadn't been able to procure Eliquis due to cost, remained compliant with prasugrel.  He was admitted to the hospital 04/28/2023 with pyelonephritis; notes reviewed. Blood work in clinic with potassium 2.8, bilirubin 5.6. BP mildly soft on admission. He was given Rocephin with resolution of symptoms by the next day. He was transitioned to cefadroxil for a total 6-day antibiotic course at discharge. Urine studies were evaluated by nephrology and showed fractional  excretion of potassium at 11% indicating tubular wasting. Chlorthalidone was permanently discontinued and not advised to use in the future. Admission was complicated by episode of Afib with RVR associated with chest pain and diaphoresis, confirmed by EKG. Given IV metoprolol with good rate control and symptom resolution. He was transitioned to home metoprolol dosing without further events.  He was initiated on Eliquis for his A-fib, received pharmacy assistance during hospitalization.  He was seen 06/19/2023. We reviewed his hospitalization. Since discharge, he had been feeling very fatigued with malaise and daytime somnolence. He was still on Bactrim, amiloride, and Flomax. He complained of increased ankle swelling bilaterally and felt he was retaining fluid. His weight had increased by 6 lbs since he was discharged. He denied adequate urine production; urine output was better on chlorthalidone. He noted more lower blood pressures at home such as 95 systolic. We added furosemide 40 mg daily as needed. His swelling was persistent so he was advised to increase Lasix to 40 mg BID for 5 days.   Today, he is accompanied by his wife. Doing very well. Swelling has resolved on lasix twice daily. 8/24 was last dose of BID lasix. Back to as needed. Reviewed recent labs. No shortness of breath, no limitations. Has daily upper abdominal pain that improves with Tums, gradually improving.   He denies any palpitations, chest pain, shortness of breath, peripheral edema, lightheadedness, headaches, syncope, orthopnea, or PND.   ROS:  Please see the history of present illness. ROS otherwise negative except as noted.    Studies Reviewed: .         Echo  05/01/2023: Sonographer Comments: Suboptimal  parasternal window and no subcostal  window.  IMPRESSIONS   1. Left ventricular ejection fraction, by estimation, is 55 to 60%. The  left ventricle has normal function. The left ventricle has no regional  wall motion  abnormalities. Left ventricular diastolic parameters were  normal.   2. Right ventricular systolic function is normal. The right ventricular  size is normal.   3. Left atrial size was moderately dilated.   4. The mitral valve is abnormal. Trivial mitral valve regurgitation. No  evidence of mitral stenosis.   5. The aortic valve is tricuspid. There is mild calcification of the  aortic valve. Aortic valve regurgitation is not visualized. Aortic valve  sclerosis is present, with no evidence of aortic valve stenosis.   6. Aortic dilatation noted. There is mild dilatation of the aortic root,  measuring 38 mm.   7. The inferior vena cava is normal in size with greater than 50%  respiratory variability, suggesting right atrial pressure of 3 mmHg.   Physical Exam:    VS:  BP 107/73 (BP Location: Left Arm, Patient Position: Sitting, Cuff Size: Large)   Pulse (!) 55   Ht 6\' 1"  (1.854 m)   Wt 288 lb 6.4 oz (130.8 kg)   SpO2 90%   BMI 38.05 kg/m    Wt Readings from Last 3 Encounters:  07/13/23 288 lb 6.4 oz (130.8 kg)  06/19/23 289 lb 8 oz (131.3 kg)  06/15/23 283 lb (128.4 kg)    GEN: Well nourished, well developed in no acute distress HEENT: Normal, moist mucous membranes NECK: No JVD CARDIAC: regular rhythm, normal S1 and S2, no rubs or gallops. No murmur. VASCULAR: Radial and DP pulses 2+ bilaterally. No carotid bruits RESPIRATORY:  Clear to auscultation without rales, wheezing or rhonchi  ABDOMEN: Soft, non-tender, non-distended MUSCULOSKELETAL:  Ambulates independently SKIN: Warm and dry, no significant LE edema. NEUROLOGIC:  Alert and oriented x 3. No focal neuro deficits noted. PSYCHIATRIC:  Normal affect   ASSESSMENT AND PLAN: .    LE edema -now resolved. Would use lasix only PRN  Coronary artery disease, with reported 29 prior stents in [place] (no records), now s/p 2V CABG 11/2020 -no recent angina -instructed on red flag warning signs that need immediate medical  attention -continue prasugrel + apixaban vs. Change to clopidogrel in the future. We discussed, doing well on current regimen, continue prasugrel at this time -continue atorvastatin -continue metoprolol. Consider imdur, amlodipine if additional anti-anginals needed   S/P Dual chamber St Jude pacemaker -established with device clinic   Paroxysmal atrial fibrillation:  -sinus by exam today -CHA2DS2/VAS Stroke Risk Points=3 -now on prasugrel and apixaban. Difficult to afford apixaban, waiting on patient assistance. Consider generic pradaxa if this can be located   Hypertension: goal <130/80 -continue chlorthalidone -Had prior leg swelling on amlodipine. -continue metoprolol   Hyperlipidemia: -LDL goal <70, LDL 55 in 2023 per [facility] -continue atorvastatin   Cardiac risk counseling and prevention recommendations: -recommend heart healthy/Mediterranean diet, with whole grains, fruits, vegetable, fish, lean meats, nuts, and olive oil. Limit salt. -recommend moderate walking, 3-5 times/week for 30-50 minutes each session. Aim for at least 150 minutes.week. Goal should be pace of 3 miles/hours, or walking 1.5 miles in 30 minutes -recommend avoidance of tobacco products. Avoid excess alcohol.  Dispo: Follow-up in 6 mos or sooner as needed.  Signed, Jodelle Red, MD

## 2023-07-13 NOTE — Patient Instructions (Signed)

## 2023-07-14 ENCOUNTER — Telehealth (HOSPITAL_BASED_OUTPATIENT_CLINIC_OR_DEPARTMENT_OTHER): Payer: Self-pay

## 2023-07-21 ENCOUNTER — Other Ambulatory Visit (HOSPITAL_BASED_OUTPATIENT_CLINIC_OR_DEPARTMENT_OTHER): Payer: Self-pay | Admitting: Cardiology

## 2023-07-21 ENCOUNTER — Other Ambulatory Visit: Payer: Self-pay | Admitting: Family Medicine

## 2023-07-21 DIAGNOSIS — I10 Essential (primary) hypertension: Secondary | ICD-10-CM

## 2023-07-21 DIAGNOSIS — I25119 Atherosclerotic heart disease of native coronary artery with unspecified angina pectoris: Secondary | ICD-10-CM

## 2023-07-21 DIAGNOSIS — K219 Gastro-esophageal reflux disease without esophagitis: Secondary | ICD-10-CM

## 2023-07-21 NOTE — Telephone Encounter (Signed)
Rx request sent to pharmacy.  

## 2023-08-05 ENCOUNTER — Encounter (HOSPITAL_BASED_OUTPATIENT_CLINIC_OR_DEPARTMENT_OTHER): Payer: Self-pay | Admitting: Cardiology

## 2023-08-29 ENCOUNTER — Other Ambulatory Visit: Payer: Self-pay | Admitting: Family Medicine

## 2023-08-31 ENCOUNTER — Ambulatory Visit (INDEPENDENT_AMBULATORY_CARE_PROVIDER_SITE_OTHER): Payer: Medicare HMO

## 2023-08-31 DIAGNOSIS — I480 Paroxysmal atrial fibrillation: Secondary | ICD-10-CM | POA: Diagnosis not present

## 2023-08-31 NOTE — Telephone Encounter (Signed)
Appears no longer taking per most recent cardiology note

## 2023-09-02 LAB — CUP PACEART REMOTE DEVICE CHECK
Battery Remaining Longevity: 73 mo
Battery Remaining Percentage: 58 %
Battery Voltage: 2.98 V
Brady Statistic AP VP Percent: 1 %
Brady Statistic AP VS Percent: 1 %
Brady Statistic AS VP Percent: 1 %
Brady Statistic AS VS Percent: 98 %
Brady Statistic RA Percent Paced: 1 %
Brady Statistic RV Percent Paced: 1 %
Date Time Interrogation Session: 20241014020015
Implantable Lead Connection Status: 753985
Implantable Lead Connection Status: 753985
Implantable Lead Implant Date: 20190322
Implantable Lead Implant Date: 20190322
Implantable Lead Location: 753859
Implantable Lead Location: 753860
Implantable Pulse Generator Implant Date: 20190322
Lead Channel Impedance Value: 390 Ohm
Lead Channel Impedance Value: 510 Ohm
Lead Channel Pacing Threshold Amplitude: 1 V
Lead Channel Pacing Threshold Amplitude: 1 V
Lead Channel Pacing Threshold Pulse Width: 0.5 ms
Lead Channel Pacing Threshold Pulse Width: 1 ms
Lead Channel Sensing Intrinsic Amplitude: 0.8 mV
Lead Channel Sensing Intrinsic Amplitude: 5.9 mV
Lead Channel Setting Pacing Amplitude: 2.5 V
Lead Channel Setting Pacing Amplitude: 2.5 V
Lead Channel Setting Pacing Pulse Width: 0.5 ms
Lead Channel Setting Sensing Sensitivity: 2.5 mV
Pulse Gen Model: 2272
Pulse Gen Serial Number: 8996724

## 2023-09-08 ENCOUNTER — Ambulatory Visit (INDEPENDENT_AMBULATORY_CARE_PROVIDER_SITE_OTHER): Payer: Medicare HMO | Admitting: Student

## 2023-09-08 ENCOUNTER — Encounter: Payer: Self-pay | Admitting: Student

## 2023-09-08 VITALS — BP 129/84 | HR 58 | Ht 73.0 in | Wt 291.8 lb

## 2023-09-08 DIAGNOSIS — K219 Gastro-esophageal reflux disease without esophagitis: Secondary | ICD-10-CM

## 2023-09-08 MED ORDER — BACLOFEN 10 MG PO TABS
10.0000 mg | ORAL_TABLET | Freq: Three times a day (TID) | ORAL | 0 refills | Status: DC
Start: 2023-09-08 — End: 2023-12-17

## 2023-09-08 NOTE — Patient Instructions (Signed)
It was great to see you! Thank you for allowing me to participate in your care!   I recommend that you always bring your medications to each appointment as this makes it easy to ensure we are on the correct medications and helps [place] not miss when refills are needed.  Our plans for today:  - I have sent referral to GI - Take baclofen 10 mg three times daily to assist with reflux and belching   Take care and seek immediate care sooner if you develop any concerns. Please remember to show up 15 minutes before your scheduled appointment time!  Tiffany Kocher, DO [facility]

## 2023-09-08 NOTE — Assessment & Plan Note (Signed)
Refractory GERD.  Recommend GI consult for consideration of EGD.  Low concern that reflux symptoms are related to heart. - Baclofen 10 mg 3 times daily, to assist with belching - Referral GI

## 2023-09-08 NOTE — Progress Notes (Signed)
    SUBJECTIVE:   CHIEF COMPLAINT / HPI:   GERD Patient has longstanding diagnosis of GERD.  Currently taking 40 mg of Protonix daily, famotidine twice daily as well as Tums as needed.  Despite these medications he is still having increased reflux and belching.  Symptoms are worse after eating, and worse when lying flat.  No chest pain, no shortness of breath, not exertional in nature, not reproducible.  PERTINENT  PMH / PSH: Atrial fibrillation, hypertension, CAD, pacemaker, history of CABG x 2  OBJECTIVE:   BP 129/84   Pulse (!) 58   Ht 6\' 1"  (1.854 m)   Wt 291 lb 12.8 oz (132.4 kg)   SpO2 94%   BMI 38.50 kg/m    General: NAD, pleasant Cardio: RRR, no MRG. Respiratory: CTAB, normal wob on RA GI: Abdomen is soft, not tender, not distended. BS present Skin: Warm and dry  ASSESSMENT/PLAN:   Assessment & Plan Gastroesophageal reflux disease, unspecified whether esophagitis present Refractory GERD.  Recommend GI consult for consideration of EGD.  Low concern that reflux symptoms are related to heart. - Baclofen 10 mg 3 times daily, to assist with belching - Referral GI   Tiffany Kocher, DO [facility]

## 2023-09-16 NOTE — Progress Notes (Signed)
Remote pacemaker transmission.   

## 2023-11-27 ENCOUNTER — Other Ambulatory Visit: Payer: Self-pay | Admitting: Family Medicine

## 2023-11-30 ENCOUNTER — Ambulatory Visit (INDEPENDENT_AMBULATORY_CARE_PROVIDER_SITE_OTHER): Payer: Self-pay

## 2023-11-30 DIAGNOSIS — I480 Paroxysmal atrial fibrillation: Secondary | ICD-10-CM

## 2023-11-30 LAB — CUP PACEART REMOTE DEVICE CHECK
Battery Remaining Longevity: 70 mo
Battery Remaining Percentage: 56 %
Battery Voltage: 2.98 V
Brady Statistic AP VP Percent: 1 %
Brady Statistic AP VS Percent: 1 %
Brady Statistic AS VP Percent: 1 %
Brady Statistic AS VS Percent: 98 %
Brady Statistic RA Percent Paced: 1 %
Brady Statistic RV Percent Paced: 1 %
Date Time Interrogation Session: 20250113055807
Implantable Lead Connection Status: 753985
Implantable Lead Connection Status: 753985
Implantable Lead Implant Date: 20190322
Implantable Lead Implant Date: 20190322
Implantable Lead Location: 753859
Implantable Lead Location: 753860
Implantable Pulse Generator Implant Date: 20190322
Lead Channel Impedance Value: 390 Ohm
Lead Channel Impedance Value: 480 Ohm
Lead Channel Pacing Threshold Amplitude: 1 V
Lead Channel Pacing Threshold Amplitude: 1 V
Lead Channel Pacing Threshold Pulse Width: 0.5 ms
Lead Channel Pacing Threshold Pulse Width: 1 ms
Lead Channel Sensing Intrinsic Amplitude: 0.3 mV
Lead Channel Sensing Intrinsic Amplitude: 6.5 mV
Lead Channel Setting Pacing Amplitude: 2.5 V
Lead Channel Setting Pacing Amplitude: 2.5 V
Lead Channel Setting Pacing Pulse Width: 0.5 ms
Lead Channel Setting Sensing Sensitivity: 2.5 mV
Pulse Gen Model: 2272
Pulse Gen Serial Number: 8996724

## 2023-12-09 ENCOUNTER — Ambulatory Visit (HOSPITAL_BASED_OUTPATIENT_CLINIC_OR_DEPARTMENT_OTHER): Payer: Medicare HMO | Admitting: Cardiology

## 2023-12-17 ENCOUNTER — Encounter: Payer: Self-pay | Admitting: Family Medicine

## 2023-12-17 ENCOUNTER — Ambulatory Visit (INDEPENDENT_AMBULATORY_CARE_PROVIDER_SITE_OTHER): Payer: Medicare HMO | Admitting: Family Medicine

## 2023-12-17 VITALS — BP 124/80 | HR 61 | Ht 73.0 in | Wt 297.6 lb

## 2023-12-17 DIAGNOSIS — L608 Other nail disorders: Secondary | ICD-10-CM | POA: Diagnosis not present

## 2023-12-17 DIAGNOSIS — H6993 Unspecified Eustachian tube disorder, bilateral: Secondary | ICD-10-CM

## 2023-12-17 DIAGNOSIS — I10 Essential (primary) hypertension: Secondary | ICD-10-CM

## 2023-12-17 NOTE — Assessment & Plan Note (Signed)
Well controlled with good medication adherence. Checking BMP, Mg due to history of hypokalemia. Continue amiloride and metoprolol.

## 2023-12-17 NOTE — Progress Notes (Signed)
    SUBJECTIVE:   CHIEF COMPLAINT / HPI:   Ear complaints Has had bilateral ear pruritus, crusting, and fullness for 5-6 months. This started after a sinus infection and has had several sinus infections during this time period. Has tried peroxide (did not help) and has been cleaning ear out daily, notes wax sometimes. Denies drainage, bleeding, otalgia, changes in hearing, ear trauma or surgeries, fevers. Also endorses post nasal drip for several months, saline spray helps.  Toenail discoloration Bilateral great toenails with black discoloration. Present for several weeks, wondering if there is a fungal infection. Has not tried any medications for it  Medication review Stopped taking pepcid and omeprazole, takes occasional Tums for reflux Takes Lasix 2-3 times per week for leg swelling Taking Eliquis 5mg  BID per cardiology Reports good adherence to all medications  PERTINENT  PMH / PSH: CAD, CABGx2, paroxysmal Afib, HTN  OBJECTIVE:   BP 124/80   Pulse 61   Ht 6\' 1"  (1.854 m)   Wt 297 lb 9.6 oz (135 kg)   SpO2 97%   BMI 39.26 kg/m    Gen: sitting comfortably in exam room, pleasant and conversant, in NAD Pulm: even, non labored breathing on RA HEENT: atraumatic, normocephalic, moist mucus membranes, no pharyngeal erythema, normal nasal mucosa, no nasal polyps, no pain with otoscope insertion bilaterally, no cerumen, no erythema in auditory canals, tympanic membranes not fully visualized, no LAD Derm: no rashes or lesions on head/neck, bilateral great toenails with black discoloration (see photo in media tab)  Assessment & Plan Eustachian tube dysfunction, bilateral Bilateral ear fullness and pruritus associated with episodes of sinusitis most likely causing mild eustachian tube inflammation. Lack of infectious signs or symptoms make otitis externa and media less likely while lack of hearing loss, tinnitus, and vertigo make Meniere's less likely. Lack of rash rules out dermatologic  etiologies of pruritus. Will trial daily Flonase for 1 month to reduce inflammation.  Primary hypertension Well controlled with good medication adherence. Checking BMP, Mg due to history of hypokalemia. Continue amiloride and metoprolol. Nail discoloration Possible fungal infection vs chronic irritation. Scheduled for more complete evaluation in derm clinic next week    Lorayne Bender, MD [facility]

## 2023-12-17 NOTE — Patient Instructions (Addendum)
Thank you for coming in today! Here is a summary of what we discussed:  For your ears, please use Flonase once a day for 1 month. If you are still having symptoms or things get worse, please make a follow up appointment and we can discuss further.  We will see you next week to look at your toenails.  You can get the Shingrix vaccine at the pharmacy. You will need a 2nd shot 2-6 months after the first. This vaccine is important to help prevent shingles.    We are checking some labs today. If they are abnormal, I will call you. If they are normal, I will send you a MyChart message (if it is active) or a letter in the mail. If you do not hear about your labs in the next 2 weeks, please call the office.    If you haven't already, sign up for My Chart to have easy access to your labs results, and communication with your primary care physician.  I recommend that you always bring your medications to each appointment as this makes it easy to ensure you are on the correct medications and helps [place] not miss refills when you need them.  Please call the clinic at [phone] if your symptoms worsen or you have any concerns.  Best, Dr Dolan Amen

## 2023-12-18 ENCOUNTER — Encounter: Payer: Self-pay | Admitting: Family Medicine

## 2023-12-18 LAB — BASIC METABOLIC PANEL
BUN/Creatinine Ratio: 12 (ref 10–24)
BUN: 13 mg/dL (ref 8–27)
CO2: 27 mmol/L (ref 20–29)
Calcium: 9.5 mg/dL (ref 8.6–10.2)
Chloride: 101 mmol/L (ref 96–106)
Creatinine, Ser: 1.13 mg/dL (ref 0.76–1.27)
Glucose: 86 mg/dL (ref 70–99)
Potassium: 4.3 mmol/L (ref 3.5–5.2)
Sodium: 142 mmol/L (ref 134–144)
eGFR: 71 mL/min/{1.73_m2} (ref >59)

## 2023-12-18 LAB — MAGNESIUM: Magnesium: 1.9 mg/dL (ref 1.6–2.3)

## 2023-12-21 ENCOUNTER — Ambulatory Visit: Payer: Medicare HMO | Attending: Pulmonary Disease | Admitting: Pulmonary Disease

## 2023-12-21 ENCOUNTER — Encounter: Payer: Self-pay | Admitting: Pulmonary Disease

## 2023-12-21 VITALS — BP 110/76 | HR 61 | Ht 73.0 in | Wt 299.2 lb

## 2023-12-21 DIAGNOSIS — I495 Sick sinus syndrome: Secondary | ICD-10-CM | POA: Diagnosis not present

## 2023-12-21 DIAGNOSIS — I2510 Atherosclerotic heart disease of native coronary artery without angina pectoris: Secondary | ICD-10-CM

## 2023-12-21 DIAGNOSIS — Z951 Presence of aortocoronary bypass graft: Secondary | ICD-10-CM | POA: Diagnosis not present

## 2023-12-21 DIAGNOSIS — D6869 Other thrombophilia: Secondary | ICD-10-CM

## 2023-12-21 DIAGNOSIS — Z950 Presence of cardiac pacemaker: Secondary | ICD-10-CM

## 2023-12-21 DIAGNOSIS — I480 Paroxysmal atrial fibrillation: Secondary | ICD-10-CM

## 2023-12-21 LAB — CUP PACEART INCLINIC DEVICE CHECK
Battery Remaining Longevity: 81 mo
Battery Voltage: 2.98 V
Brady Statistic RA Percent Paced: 0 %
Brady Statistic RV Percent Paced: 0.51 %
Date Time Interrogation Session: 20250203181149
Implantable Lead Connection Status: 753985
Implantable Lead Connection Status: 753985
Implantable Lead Implant Date: 20190322
Implantable Lead Implant Date: 20190322
Implantable Lead Location: 753859
Implantable Lead Location: 753860
Implantable Pulse Generator Implant Date: 20190322
Lead Channel Impedance Value: 387.5 Ohm
Lead Channel Impedance Value: 387.5 Ohm
Lead Channel Impedance Value: 387.5 Ohm
Lead Channel Impedance Value: 462.5 Ohm
Lead Channel Impedance Value: 462.5 Ohm
Lead Channel Impedance Value: 462.5 Ohm
Lead Channel Pacing Threshold Amplitude: 0.75 V
Lead Channel Pacing Threshold Amplitude: 0.75 V
Lead Channel Pacing Threshold Amplitude: 0.75 V
Lead Channel Pacing Threshold Amplitude: 0.75 V
Lead Channel Pacing Threshold Amplitude: 0.75 V
Lead Channel Pacing Threshold Amplitude: 0.75 V
Lead Channel Pacing Threshold Amplitude: 1 V
Lead Channel Pacing Threshold Amplitude: 1 V
Lead Channel Pacing Threshold Amplitude: 1 V
Lead Channel Pacing Threshold Amplitude: 1 V
Lead Channel Pacing Threshold Amplitude: 1 V
Lead Channel Pacing Threshold Amplitude: 1 V
Lead Channel Pacing Threshold Pulse Width: 0.5 ms
Lead Channel Pacing Threshold Pulse Width: 0.5 ms
Lead Channel Pacing Threshold Pulse Width: 0.5 ms
Lead Channel Pacing Threshold Pulse Width: 0.5 ms
Lead Channel Pacing Threshold Pulse Width: 0.5 ms
Lead Channel Pacing Threshold Pulse Width: 0.5 ms
Lead Channel Pacing Threshold Pulse Width: 1 ms
Lead Channel Pacing Threshold Pulse Width: 1 ms
Lead Channel Pacing Threshold Pulse Width: 1 ms
Lead Channel Pacing Threshold Pulse Width: 1 ms
Lead Channel Pacing Threshold Pulse Width: 1 ms
Lead Channel Pacing Threshold Pulse Width: 1 ms
Lead Channel Sensing Intrinsic Amplitude: 0.3 mV
Lead Channel Sensing Intrinsic Amplitude: 0.3 mV
Lead Channel Sensing Intrinsic Amplitude: 0.3 mV
Lead Channel Sensing Intrinsic Amplitude: 6 mV
Lead Channel Sensing Intrinsic Amplitude: 6 mV
Lead Channel Sensing Intrinsic Amplitude: 6 mV
Lead Channel Setting Pacing Amplitude: 2.5 V
Lead Channel Setting Pacing Amplitude: 2.5 V
Lead Channel Setting Pacing Pulse Width: 0.5 ms
Lead Channel Setting Sensing Sensitivity: 2.5 mV
Pulse Gen Model: 2272
Pulse Gen Serial Number: 8996724

## 2023-12-21 NOTE — Patient Instructions (Signed)
Medication Instructions:  Your physician recommends that you continue on your current medications as directed. Please refer to the Current Medication list given to you today.  *If you need a refill on your cardiac medications before your next appointment, please call your pharmacy*  Lab Work: None  If you have labs (blood work) drawn today and your tests are completely normal, you will receive your results only by: MyChart Message (if you have MyChart) OR A paper copy in the mail If you have any lab test that is abnormal or we need to change your treatment, we will call you to review the results.  Follow-Up: At [facility], you and your health needs are our priority.  As part of our continuing mission to provide you with exceptional heart care, we have created designated Provider Care Teams.  These Care Teams include your primary Cardiologist (physician) and Advanced Practice Providers (APPs -  Physician Assistants and Nurse Practitioners) who all work together to provide you with the care you need, when you need it.  Your next appointment:   1 year(s)  Provider:   Nobie Putnam, MD or Canary Brim, NP

## 2023-12-21 NOTE — Progress Notes (Signed)
  Electrophysiology Office Note:   Date:  12/21/2023  ID:  Ryan Clarke, DOB [birth year], MRN [id number]  Primary Cardiologist: Jodelle Red, MD Primary Heart Failure: None Electrophysiologist: Sherryl Manges, MD       History of Present Illness:   Ryan Clarke is a 68 y.o. male with h/o AF, CAD ((multiple remote stents > CABG Jan 2022), HTN, HLD, polycythemia, GERD seen today for routine electrophysiology followup.   Pt last seen in EP Clinic in 08/2021 after moving from [place].  He was overall doing well but  noted infrequent "skipped/missed beats".  Reports occasionally he "feels" his device.  Sleeps on his left side > may be part of it.  No acute device related concerns.   Since last being seen in our clinic the patient reports doing well overall. He denies chest pain, palpitations, dyspnea, PND, orthopnea, nausea, vomiting, dizziness, syncope, edema, weight gain, or early satiety.   Review of systems complete and found to be negative unless listed in HPI.   EP Information / Studies Reviewed:    EKG is not ordered today. EKG from 06/19/23 reviewed which showed SB 59 bpm with 1st degree AVB, RBBB      PPM Interrogation-  reviewed in detail today,  See PACEART report.  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 The patient's score is based upon: CHF History: 0 HTN History: 1 Diabetes History: 0 Stroke History: 0 Vascular Disease History: 1 Age Score: 1 Gender Score: 0             Physical Exam:   VS:  BP 110/76   Pulse 61   Ht 6\' 1"  (1.854 m)   Wt 299 lb 3.2 oz (135.7 kg)   SpO2  97%   BMI 39.47 kg/m    Wt Readings from Last 3 Encounters:  12/21/23 299 lb 3.2 oz (135.7 kg)  12/17/23 297 lb 9.6 oz (135 kg)  09/08/23 291 lb 12.8 oz (132.4 kg)     GEN: Well nourished, well developed in no acute distress NECK: No JVD; No carotid bruits CARDIAC: Regular rate and rhythm, no murmurs, rubs, gallops RESPIRATORY:  Clear to auscultation without rales, wheezing or rhonchi  ABDOMEN: Soft, non-tender, non-distended EXTREMITIES:  No edema; No deformity   ASSESSMENT AND PLAN:    SND s/p Abbott PPM  Hx of PMT, see note from 05/13/23 -Normal PPM function -See Pace Art report -No changes today  Paroxysmal AF  CHA2DS2-VASc 3 -<1% burden on device  -anticoagulation for stroke prophylaxis  -continue metoprolol tartrate 25 mg BID    Secondary Hypercoagulable State  -continue Eliquis, dose reviewed and appropriate by age/wt   Hx Palpitations  -previously noted PVC on device interrogation with prior consideration for monitor   CAD s/p CABG  CABG in 2022 -no anginal symptoms    Disposition:   Follow up with Dr. Graciela Husbands in 6 months  Signed, Canary Brim, NP-C, AGACNP-BC [facility] - Elect[facility]iology  12/21/2023, 6:28 PM

## 2023-12-23 ENCOUNTER — Ambulatory Visit: Payer: Medicare HMO

## 2023-12-23 VITALS — BP 110/70 | HR 64 | Temp 97.8°F | Ht 73.0 in | Wt 296.0 lb

## 2023-12-23 DIAGNOSIS — B351 Tinea unguium: Secondary | ICD-10-CM

## 2023-12-23 MED ORDER — CICLOPIROX 8 % EX SOLN: Freq: Every day | CUTANEOUS | 0 refills | Status: AC

## 2023-12-23 NOTE — Patient Instructions (Signed)
 It was great to see you! Thank you for allowing me to participate in your care!  Our plans for today:  - I have sent the medicine PenLac  to your pharmacy. - You will apply this every 7 days and remove with alcohol each week. - Follow-up in 8 weeks.   Please arrive 15 minutes PRIOR to your next scheduled appointment time! If you do not, this affects OTHER patients' care.  Take care and seek immediate care sooner if you develop any concerns.   Ryan Provencal, MD, PGY-2 [facility] 11:46 AM 12/23/2023  [facility]

## 2023-12-23 NOTE — Progress Notes (Signed)
    SUBJECTIVE:   CHIEF COMPLAINT / HPI: toenail  Has had thickening and yellow discoloration of nails for years. Does not bother patient. Regularly has wife cut nails. Keeps feet clean. Bothering him aesthetically.  PERTINENT  PMH / PSH: CAD, HTN, PAF, GERD, S/p CABG x2  OBJECTIVE:   BP 110/70   Pulse 64   Temp 97.8 F (36.6 C)   Ht 6' 1 (1.854 m)   Wt 296 lb (134.3 kg)   SpO2 94%   BMI 39.05 kg/m   General: NAD, well appearing Neuro: A&O Respiratory: normal WOB on RA Extremities: Moving all 4 extremities equally Feet: Bilateral yellow discoloration of distal toenails, mild distal toenail thickening bilaterally    ASSESSMENT/PLAN:   Assessment & Plan Onychomycosis Exam consistent with onychomycosis.  Discussed pros and cons of topical therapy versus oral terbinafine.  Patient and wife elect for trial of topical therapy first.  Penlac  sent to patient's pharmacy, discussed proper application.  Follow-up 2 months.  Return in about 2 months (around 02/20/2024).  Ozell Provencal, MD [facility]

## 2024-01-12 NOTE — Progress Notes (Signed)
 Remote pacemaker transmission.

## 2024-01-13 ENCOUNTER — Encounter: Payer: Self-pay | Admitting: Internal Medicine

## 2024-01-22 ENCOUNTER — Telehealth (HOSPITAL_BASED_OUTPATIENT_CLINIC_OR_DEPARTMENT_OTHER): Payer: Self-pay

## 2024-01-22 NOTE — Telephone Encounter (Signed)
 Pt was originally double booked on 4/29 @ 11:00. Pt has been r/s to 4/30 @ 11:40. Pt verbalized understanding.

## 2024-02-14 ENCOUNTER — Other Ambulatory Visit: Payer: Self-pay | Admitting: Cardiology

## 2024-02-14 DIAGNOSIS — E7800 Pure hypercholesterolemia, unspecified: Secondary | ICD-10-CM

## 2024-02-27 ENCOUNTER — Other Ambulatory Visit (HOSPITAL_BASED_OUTPATIENT_CLINIC_OR_DEPARTMENT_OTHER): Payer: Self-pay | Admitting: Cardiology

## 2024-02-27 DIAGNOSIS — I25119 Atherosclerotic heart disease of native coronary artery with unspecified angina pectoris: Secondary | ICD-10-CM

## 2024-02-29 ENCOUNTER — Ambulatory Visit (INDEPENDENT_AMBULATORY_CARE_PROVIDER_SITE_OTHER): Payer: Self-pay

## 2024-02-29 DIAGNOSIS — I480 Paroxysmal atrial fibrillation: Secondary | ICD-10-CM

## 2024-02-29 DIAGNOSIS — I495 Sick sinus syndrome: Secondary | ICD-10-CM

## 2024-02-29 LAB — CUP PACEART REMOTE DEVICE CHECK
Battery Remaining Longevity: 68 mo
Battery Remaining Percentage: 54 %
Battery Voltage: 2.98 V
Brady Statistic AP VP Percent: 1 %
Brady Statistic AP VS Percent: 1 %
Brady Statistic AS VP Percent: 1 %
Brady Statistic AS VS Percent: 99 %
Brady Statistic RA Percent Paced: 1 %
Brady Statistic RV Percent Paced: 1 %
Date Time Interrogation Session: 20250414033637
Implantable Lead Connection Status: 753985
Implantable Lead Connection Status: 753985
Implantable Lead Implant Date: 20190322
Implantable Lead Implant Date: 20190322
Implantable Lead Location: 753859
Implantable Lead Location: 753860
Implantable Pulse Generator Implant Date: 20190322
Lead Channel Impedance Value: 380 Ohm
Lead Channel Impedance Value: 450 Ohm
Lead Channel Pacing Threshold Amplitude: 0.75 V
Lead Channel Pacing Threshold Amplitude: 1 V
Lead Channel Pacing Threshold Pulse Width: 0.5 ms
Lead Channel Pacing Threshold Pulse Width: 1 ms
Lead Channel Sensing Intrinsic Amplitude: 0.8 mV
Lead Channel Sensing Intrinsic Amplitude: 6 mV
Lead Channel Setting Pacing Amplitude: 2.5 V
Lead Channel Setting Pacing Amplitude: 2.5 V
Lead Channel Setting Pacing Pulse Width: 0.5 ms
Lead Channel Setting Sensing Sensitivity: 2.5 mV
Pulse Gen Model: 2272
Pulse Gen Serial Number: 8996724

## 2024-03-04 ENCOUNTER — Other Ambulatory Visit: Payer: Self-pay | Admitting: Family Medicine

## 2024-03-10 ENCOUNTER — Encounter: Payer: Self-pay | Admitting: Internal Medicine

## 2024-03-15 ENCOUNTER — Ambulatory Visit (HOSPITAL_BASED_OUTPATIENT_CLINIC_OR_DEPARTMENT_OTHER): Payer: Medicare HMO | Admitting: Cardiology

## 2024-03-16 ENCOUNTER — Encounter (HOSPITAL_BASED_OUTPATIENT_CLINIC_OR_DEPARTMENT_OTHER): Payer: Self-pay | Admitting: Cardiology

## 2024-03-16 ENCOUNTER — Ambulatory Visit (HOSPITAL_BASED_OUTPATIENT_CLINIC_OR_DEPARTMENT_OTHER): Admitting: Cardiology

## 2024-03-16 VITALS — BP 128/72 | HR 56 | Ht 73.0 in | Wt 296.1 lb

## 2024-03-16 DIAGNOSIS — Z7901 Long term (current) use of anticoagulants: Secondary | ICD-10-CM | POA: Diagnosis not present

## 2024-03-16 DIAGNOSIS — D6869 Other thrombophilia: Secondary | ICD-10-CM

## 2024-03-16 DIAGNOSIS — R600 Localized edema: Secondary | ICD-10-CM

## 2024-03-16 DIAGNOSIS — I2510 Atherosclerotic heart disease of native coronary artery without angina pectoris: Secondary | ICD-10-CM | POA: Diagnosis not present

## 2024-03-16 DIAGNOSIS — Z951 Presence of aortocoronary bypass graft: Secondary | ICD-10-CM | POA: Diagnosis not present

## 2024-03-16 DIAGNOSIS — I10 Essential (primary) hypertension: Secondary | ICD-10-CM | POA: Diagnosis not present

## 2024-03-16 DIAGNOSIS — I480 Paroxysmal atrial fibrillation: Secondary | ICD-10-CM | POA: Diagnosis not present

## 2024-03-16 NOTE — Progress Notes (Signed)
 Cardiology Office Note:  .    Date:  03/16/2024  ID:  Delano Artuso, DOB 1956-08-29, MRN [id number] PCP: Naida Austria, MD  [facility] Providers Cardiologist:  Sheryle Donning, MD Electrophysiologist:  Richardo Chandler, MD     History of Present Illness: Ryan Clarke    Ryan Clarke is a 68 y.o. male with a hx of CAD with many stents remotely, now s/p CABGx2 11/2020, pacemaker, who is seen for close follow up. I initially met him 08/14/20 as a referral for palpitations, but on discussion discovered extensive CAD history and angina.   Cardiac history: Had all of his prior cardiac care as part of [facility] in [place]. Not available in Care Everywhere. Reports history of 29 stents, last stent 06/2019. Moved to  in 05/2020. S/P CABGx2 by Dr. Sherene Dilling on 11/26/20, complicated by post op atrial fibrillation. Due to recurrence of paroxysmal atrial fib, continued on anticoagulation.   Today: Back to work for the last week after trying retirement, doing well. Takes fluid pill daily right now, feels this works well. 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  Palpitations are stable, brief/infrequent. Reviewed his visit with Creighton Doffing in EP in 12/2023.  Apixaban  is about $66/mo, discussed generic   ROS:  Denies chest pain, shortness of breath at rest or with normal exertion. No PND, orthopnea, or unexpected weight gain. No syncope. ROS otherwise negative except as noted.    Studies Reviewed: Ryan Clarke        Physical Exam:    VS:  BP 128/72 (BP Location: Right Arm, Patient Position: Sitting, Cuff Size: Normal)   Pulse (!) 56   Ht 6\' 1"  (1.854 m)   Wt 296 lb 1.6 oz (134.3 kg)   SpO2 98%   BMI 39.07 kg/m    Wt Readings from Last 3 Encounters:  03/16/24 296 lb 1.6 oz (134.3 kg)  12/23/23 296 lb (134.3 kg)  12/21/23 299 lb 3.2 oz (135.7 kg)    GEN: Well nourished, well developed in no acute distress HEENT: Normal, moist mucous membranes NECK: No JVD CARDIAC: regular rhythm, normal S1 and S2, no rubs or  gallops. No murmur. VASCULAR: Radial and DP pulses 2+ bilaterally. No carotid bruits RESPIRATORY:  Clear to auscultation without rales, wheezing or rhonchi  ABDOMEN: Soft, non-tender, non-distended MUSCULOSKELETAL:  Ambulates independently SKIN: Warm and dry, no significant LE edema. NEUROLOGIC:  Alert and oriented x 3. No focal neuro deficits noted. PSYCHIATRIC:  Normal affect   ASSESSMENT AND PLAN: .    LE edema -none today on exam, reports this is intermittent. Previously recommended PRN lasix , using daily. He will monitor and see if he can use lasix  less frequently in the future  Coronary artery disease, with reported 29 prior stents in [place]  (no records), now s/p 2V CABG 11/2020 -no recent angina -instructed on red flag warning signs that need immediate medical attention -continue prasugrel  + apixaban  vs. Change to clopidogrel in the future. We discussed, doing well on current regimen, continue prasugrel  at this time -continue atorvastatin  -continue metoprolol . 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 -continue metoprolol    Hyperlipidemia: -LDL goal <70 -continue atorvastatin    Cardiac risk counseling and prevention recommendations: -recommend heart healthy/Mediterranean diet, with  whole grains, fruits, vegetable, fish, lean meats, nuts, and olive oil. Limit salt. -recommend moderate walking, 3-5 times/week for 30-50 minutes each session. Aim for at least 150 minutes.week. Goal should be pace of 3 miles/hours, or walking 1.5 miles in 30 minutes -recommend avoidance of tobacco products. Avoid excess alcohol.  Dispo: Follow-up in 6 mos or sooner as  needed.  Signed, Sheryle Donning, MD

## 2024-03-16 NOTE — Patient Instructions (Signed)
 Medication Instructions:  Your physician recommends that you continue on your current medications as directed. Please refer to the Current Medication list given to you today.   *If you need a refill on your cardiac medications before your next appointment, please call your pharmacy*  Follow-Up: At [facility], you and your health needs are our priority.  As part of our continuing mission to provide you with exceptional heart care, our providers are all part of one team.  This team includes your primary Cardiologist (physician) and Advanced Practice Providers or APPs (Physician Assistants and Nurse Practitioners) who all work together to provide you with the care you need, when you need it.  Please follow up in 6 months with Dr. Veryl Gottron, Slater Duncan, NP or Neomi Banks, NP   We recommend signing up for the patient portal called "MyChart".  Sign up information is provided on this After Visit Summary.  MyChart is used to connect with patients for Virtual Visits (Telemedicine).  Patients are able to view lab/test results, encounter notes, upcoming appointments, etc.  Non-urgent messages can be sent to your provider as well.   To learn more about what you can do with MyChart, go to [email or web address].

## 2024-04-20 NOTE — Progress Notes (Signed)
 Remote pacemaker transmission.

## 2024-04-28 ENCOUNTER — Telehealth: Payer: Self-pay | Admitting: Cardiology

## 2024-04-28 ENCOUNTER — Ambulatory Visit
Admission: EM | Admit: 2024-04-28 | Discharge: 2024-04-28 | Disposition: A | Discharge status: Home / Self Care | Attending: Family Medicine | Admitting: Family Medicine

## 2024-04-28 DIAGNOSIS — R079 Chest pain, unspecified: Secondary | ICD-10-CM | POA: Diagnosis not present

## 2024-04-28 DIAGNOSIS — M79662 Pain in left lower leg: Secondary | ICD-10-CM | POA: Diagnosis not present

## 2024-04-28 DIAGNOSIS — M79661 Pain in right lower leg: Secondary | ICD-10-CM | POA: Diagnosis not present

## 2024-04-28 DIAGNOSIS — R000 Tachycardia, unspecified: Secondary | ICD-10-CM | POA: Diagnosis not present

## 2024-04-28 NOTE — Discharge Instructions (Signed)
 Please call first thing tomorrow at [phone]. This is important to schedule your ultrasound as soon as possible to rule out a dvt.    For now no further temporary relief, I recommend elevating your legs, resting off of your legs.  You can use compression socks unless they are eliciting pain as well.  Otherwise, can use warm compresses.  I would also recommend using your furosemide .

## 2024-04-28 NOTE — Telephone Encounter (Signed)
 Pt returned call to LPN. DOB and full name verified.  Pt states yesterday had some palpitations when coming home from work, came home at 11 then went to bed. Pt had a funny feeling in his chest and stomach that woke him up, feeling was close to angina. Called EMS and took 5 baby aspirins but by the time they came, he felt better. EKG and BP was fine so pt did not go to ED.   Pt did not take his Metoprolol  AM dose since he went to work early yesterday but took his PM dose.  Pt denied drinking increased caffeine and hydrates adequately.  Pt states his R calf and ankle has been very swollen (started today) and feels he can barely move because of the pain. Describe the feeling as sore muscles. He now feels his L calf is hurting the sam,e and is concerned of it being a blood clot. Pt admits that he does over exert himself at work and walks around a lot.  Has no RX of nitroglycerin . Metoprolol  255 mg BID.  Pt confirms that his blood pressure has been fine but cannot provide log - only states that his BP last night with EMS was normal.  Will route to MD.  Informed pt that sore muscles could be related to increase walking and over exertion at work.

## 2024-04-28 NOTE — ED Provider Notes (Addendum)
 [facility]  Note:  This document was prepared using Conservation officer, historic buildings and may include unintentional dictation errors.  MRN: [id number] DOB: [birth year]  Subjective:   Ryan Clarke is a 68 y.o. male presenting for 1 day history of bilateral lower leg/calf pain with swelling.  Patient contacted his cardiologist and advised that he come in for an evaluation to our clinic.  He is on chronic anticoagulation with Eliquis  as he has paroxysmal atrial fibrillation, secondary hypercoagulable state.  Patient denies any falls, trauma, inactivity.  He does wear compression socks but took them off for an evaluation in the clinic.  Denies redness, warmth, fever.  He does have a prescription for furosemide  but has not used it.  Note, patient has been doing a lot of work where he stands or walks for long periods of time during his shift.  He had been on a break and as he returned to work his symptoms started.  No known history of pulmonary embolism, DVT.  However, he does endorse concern about having a blood clot as his atrial fibrillation was aggravated recently.  No current facility-administered medications for this encounter.  Current Outpatient Medications:    aMILoride  (MIDAMOR ) 5 MG tablet, TAKE 1 TABLET (5 MG TOTAL) BY MOUTH DAILY., Disp: 90 tablet, Rfl: 5   apixaban  (ELIQUIS ) 5 MG TABS tablet, Take 5 mg by mouth 2 (two) times daily., Disp: , Rfl:    atorvastatin  (LIPITOR ) 80 MG tablet, TAKE 1 TABLET BY MOUTH EVERYDAY AT BEDTIME, Disp: 90 tablet, Rfl: 2   furosemide  (LASIX ) 40 MG tablet, Take 1 tablet (40 mg total) by mouth daily as needed for fluid., Disp: 30 tablet, Rfl: 11   metoprolol  tartrate (LOPRESSOR ) 25 MG tablet, TAKE 1 TABLET BY MOUTH TWICE A DAY, Disp: 180 tablet, Rfl: 2   prasugrel  (EFFIENT ) 10 MG TABS tablet, TAKE 1 TABLET BY MOUTH EVERY DAY, Disp: 90 tablet, Rfl: 1   No Known Allergies  Past Medical History:  Diagnosis Date   Carpal tunnel  syndrome 09/10/2020   Coronary artery disease    Hypertension    Nodular basal cell carcinoma (BCC) 09/04/2021   Left Lower Back     Past Surgical History:  Procedure Laterality Date   CORONARY ARTERY BYPASS GRAFT N/A 11/26/2020   Procedure: CORONARY ARTERY BYPASS GRAFTING (CABG) TIMES TWO USING BILATERAL INTERNAL MAMMARY ARTERIES;  Surgeon: Bartley Lightning, MD;  Location: [facility] OR;  Service: Open Heart Surgery;  Laterality: N/A;  BIMA   CORONARY BALLOON ANGIOPLASTY N/A 09/14/2020   Procedure: CORONARY BALLOON ANGIOPLASTY;  Surgeon: Millicent Ally, MD;  Location: [facility] INVASIVE CV LAB;  Service: Cardiovascular;  Laterality: N/A;   CORONARY STENT PLACEMENT     LEFT HEART CATH AND CORONARY ANGIOGRAPHY N/A 09/14/2020   Procedure: LEFT HEART CATH AND CORONARY ANGIOGRAPHY;  Surgeon: Millicent Ally, MD;  Location: [facility] INVAS[facility] CV LAB;  Service: Cardiovascular;  Laterality: N/A;   LEFT HEART CATH AND CORONARY ANGIOGRAPHY N/A 11/19/2020   Procedure: LEFT HEART CATH AND CORONARY ANGIOGRAPHY;  Surgeon: Millicent Ally, MD;  Location: [facility] INVAS[facility] CV LAB;  Service: Cardiovascular;  Laterality: N/A;   PACEMAKER INSERTION     TEE WITHOUT CARDIOVERSION N/A 11/26/2020   Procedure: TRANSESOPHAGEAL ECHOCARDIOGRAM (TEE);  Surgeon: Bartley Lightning, MD;  Location: [facility] OR;  Service: Open Heart Surgery;  Laterality: N/A;    Family History  Problem Relation Age of Onset   Coronary artery disease Mother    Coronary artery  disease Father    Coronary artery disease Brother    Coronary artery disease Brother    Liver cancer Maternal Grandmother     Social History   Tobacco Use   Smoking status: Never   Smokeless tobacco: Never  Substance Use Topics   Alcohol use: Never   Drug use: Never    ROS   Objective:   Vitals: BP 108/68 (BP Location: Right Arm)   Pulse 70   Temp 97.8 F (36.6 C) (Oral)   Resp 16   SpO2 93%   Physical Exam Constitutional:      General: He is not in acute distress.     Appearance: Normal appearance. He is well-developed. He is not ill-appearing, toxic-appearing or diaphoretic.  HENT:     Head: Normocephalic and atraumatic.     Right Ear: External ear normal.     Left Ear: External ear normal.     Nose: Nose normal.     Mouth/Throat:     Mouth: Mucous membranes are moist.   Eyes:     General: No scleral icterus.       Right eye: No discharge.        Left eye: No discharge.     Extraocular Movements: Extraocular movements intact.    Cardiovascular:     Rate and Rhythm: Normal rate and regular rhythm.     Heart sounds: Normal heart sounds. No murmur heard.    No friction rub. No gallop.  Pulmonary:     Effort: Pulmonary effort is normal. No respiratory distress.     Breath sounds: Normal breath sounds. No stridor. No wheezing, rhonchi or rales.   Musculoskeletal:     Comments: Bilateral inferior calf tenderness.  No pitting edema, warmth, appreciable swelling.  Varicose veins noted bilaterally.   Neurological:     Mental Status: He is alert and oriented to person, place, and time.   Psychiatric:        Mood and Affect: Mood normal.        Behavior: Behavior normal.        Thought Content: Thought content normal.     Assessment and Plan :   PDMP not reviewed this encounter.  1. Pain in both lower legs    Multiple attempts were made to schedule an ultrasound for both lower extremities.  Unfortunately despite following the standard of work for scheduling we could not secure an appointment.  I advised the patient contact the central scheduling for ultrasound first thing tomorrow.  In the meantime, elevate the legs, use compression socks or warm compresses.  Maintain all other medications.  Advised that he restart furosemide  as prescribed.  I left the order as an outpatient ultrasound as opposed to an in clinic order to facilitate the scheduling tomorrow.      Adolph Hoop, [place] 04/28/24 1829

## 2024-04-28 NOTE — Telephone Encounter (Signed)
Called pt and unable to leave message

## 2024-04-28 NOTE — Telephone Encounter (Signed)
 Patient c/o Palpitations: STAT if patient c/o lightheadedness, shortness of breath, or chest pain  How long have you had palpitations/irregular HR/ Afib? Are you having the symptoms now? Since last night  Are you currently experiencing lightheadedness, SOB or CP? No   Do you have a history of afib (atrial fibrillation) or irregular heart rhythm? N/A  Have you checked your BP or HR? (document readings if available): N/A  Are you experiencing any other symptoms? Pt states he's having leg pains and can barely walk

## 2024-04-28 NOTE — ED Triage Notes (Addendum)
 Pt states bilateral leg swelling and pain since yesterday.  States he has heart disease and called his cardiologist today who told him to go to urgent care.

## 2024-04-28 NOTE — Telephone Encounter (Signed)
 Called and spoke to pt. Discussed MD's recommendations; palpitation has stayed the same and have not increased, MD is not concerned about that. Continue current medications. MD recommends that if pt feels that he cannot walk, he needs to go to urgent care as we cannot assess his legs over the phone. If he can wait until tomorrow to see his PCP, that would be great too. Pt verbalized understanding.

## 2024-04-29 ENCOUNTER — Ambulatory Visit (HOSPITAL_COMMUNITY)
Admission: RE | Admit: 2024-04-29 | Discharge: 2024-04-29 | Disposition: A | Discharge status: Home / Self Care | Source: Ambulatory Visit | Attending: Urgent Care | Admitting: Urgent Care

## 2024-04-29 DIAGNOSIS — M79661 Pain in right lower leg: Secondary | ICD-10-CM

## 2024-04-29 DIAGNOSIS — M79662 Pain in left lower leg: Secondary | ICD-10-CM | POA: Insufficient documentation

## 2024-04-29 DIAGNOSIS — R609 Edema, unspecified: Secondary | ICD-10-CM | POA: Diagnosis not present

## 2024-05-03 ENCOUNTER — Ambulatory Visit: Payer: Self-pay | Admitting: Urgent Care

## 2024-05-16 ENCOUNTER — Ambulatory Visit: Admitting: Family Medicine

## 2024-05-16 NOTE — Progress Notes (Deleted)
    SUBJECTIVE:   CHIEF COMPLAINT / HPI:   Severe leg pain Seen in urgent care 6/12, had negative DVT US . Was advised to restart furosemide  Seen by cardiology 4/30 who recommended PRN lasix , continued prasugrel  and apixaban  ***  PERTINENT  PMH / PSH: CAD s/p CABG, PAF, HTN,   OBJECTIVE:   There were no vitals taken for this visit.  ***  ASSESSMENT/PLAN:   Assessment & Plan    Payton Coward, MD [facility]

## 2024-05-30 ENCOUNTER — Ambulatory Visit (INDEPENDENT_AMBULATORY_CARE_PROVIDER_SITE_OTHER): Payer: Self-pay

## 2024-05-30 DIAGNOSIS — I495 Sick sinus syndrome: Secondary | ICD-10-CM | POA: Diagnosis not present

## 2024-05-31 ENCOUNTER — Telehealth (HOSPITAL_BASED_OUTPATIENT_CLINIC_OR_DEPARTMENT_OTHER): Payer: Self-pay | Admitting: Cardiology

## 2024-05-31 DIAGNOSIS — R600 Localized edema: Secondary | ICD-10-CM

## 2024-05-31 LAB — CUP PACEART REMOTE DEVICE CHECK
Battery Remaining Longevity: 64 mo
Battery Remaining Percentage: 52 %
Battery Voltage: 2.98 V
Brady Statistic AP VP Percent: 1 %
Brady Statistic AP VS Percent: 1 %
Brady Statistic AS VP Percent: 1 %
Brady Statistic AS VS Percent: 99 %
Brady Statistic RA Percent Paced: 1 %
Brady Statistic RV Percent Paced: 1 %
Date Time Interrogation Session: 20250714221957
Implantable Lead Connection Status: 753985
Implantable Lead Connection Status: 753985
Implantable Lead Implant Date: 20190322
Implantable Lead Implant Date: 20190322
Implantable Lead Location: 753859
Implantable Lead Location: 753860
Implantable Pulse Generator Implant Date: 20190322
Lead Channel Impedance Value: 380 Ohm
Lead Channel Impedance Value: 430 Ohm
Lead Channel Pacing Threshold Amplitude: 0.75 V
Lead Channel Pacing Threshold Amplitude: 1 V
Lead Channel Pacing Threshold Pulse Width: 0.5 ms
Lead Channel Pacing Threshold Pulse Width: 1 ms
Lead Channel Sensing Intrinsic Amplitude: 0.8 mV
Lead Channel Sensing Intrinsic Amplitude: 5.9 mV
Lead Channel Setting Pacing Amplitude: 2.5 V
Lead Channel Setting Pacing Amplitude: 2.5 V
Lead Channel Setting Pacing Pulse Width: 0.5 ms
Lead Channel Setting Sensing Sensitivity: 2.5 mV
Pulse Gen Model: 2272
Pulse Gen Serial Number: 8996724

## 2024-05-31 NOTE — Telephone Encounter (Signed)
  Per MyChart Scheduling message:  Pt c/o swelling/edema: STAT if pt has developed SOB within 24 hours  If swelling, where is the swelling located?   How much weight have you gained and in what time span?   Have you gained 2 pounds in a day or 5 pounds in a week?   Do you have a log of your daily weights (if so, list)?   Are you currently taking a fluid pill?   Are you currently SOB?   Have you traveled recently in a car or plane for an extended period of time?    Both calves,ankles and feet it has been going on since June even with compression socks .Was taken water pill didn't seem to help I am out of them now.this past week I drove 2x 10 hours but they were already swollen haven't noticed weight gain

## 2024-05-31 NOTE — Telephone Encounter (Signed)
 Left message to call back

## 2024-06-01 NOTE — Telephone Encounter (Signed)
 Patient called stating he was returning Dr. Eston call.

## 2024-06-02 NOTE — Telephone Encounter (Signed)
  Patient has sent a MyChart message stating that he would like a call back

## 2024-06-03 NOTE — Telephone Encounter (Signed)
 Called and unable to reach patient . Left message to call office back.

## 2024-06-03 NOTE — Telephone Encounter (Signed)
 Patient is following up by patient message. He says he hasn't received a call. Can someone try calling again if possible? Will confirm callback number.

## 2024-06-03 NOTE — Telephone Encounter (Signed)
 Triage team - please call.   Please inquire when he last took Lasix .   If took >3 days ago, Rx Lasix  40mg  daily x 3 days then PRN for edema (give 30 tablets with 1 refill). Recommend BMET/BNP on Monday for monitoring of renal function and electrolytes.   If took Lasix  <3 days ago and still swelling, increase Lasix  to 60mg  daily (1.5 tablets) x 3 days then BMET/BNP Monday.  To prevent or reduce lower extremity swelling: Eat a low salt diet. Salt makes the body hold onto extra fluid which causes swelling. Sit with legs elevated. For example, in the recliner or on an ottoman.  Wear knee-high compression stockings during the daytime. Ones labeled 15-20 mmHg provide good compression.   Ayleah Hofmeister S Ules Marsala, NP

## 2024-06-06 NOTE — Telephone Encounter (Signed)
 Left message to call back

## 2024-06-07 MED ORDER — FUROSEMIDE 40 MG PO TABS: 40.0000 mg | ORAL_TABLET | Freq: Every day | ORAL | 1 refills | Status: DC | PRN

## 2024-06-07 NOTE — Telephone Encounter (Signed)
 Left message to call back

## 2024-06-07 NOTE — Telephone Encounter (Signed)
 Left message to call back   Mychart message sent with Reche ORN NP recommendations

## 2024-06-07 NOTE — Telephone Encounter (Signed)
 Pt returning nurse call

## 2024-06-08 NOTE — Telephone Encounter (Signed)
 Patient has seen my chart message with recommendations.

## 2024-06-20 ENCOUNTER — Ambulatory Visit: Payer: Medicare HMO

## 2024-06-20 VITALS — Ht 73.0 in | Wt 293.0 lb

## 2024-06-20 DIAGNOSIS — Z0000 Encounter for general adult medical examination without abnormal findings: Secondary | ICD-10-CM | POA: Diagnosis not present

## 2024-06-20 NOTE — Progress Notes (Signed)
 Because this visit was a virtual/telehealth visit,  certain criteria was not obtained, such a blood pressure, CBG if applicable, and timed get up and go. Any medications not marked as taking were not mentioned during the medication reconciliation part of the visit. Any vitals not documented were not able to be obtained due to this being a telehealth visit or patient was unable to self-report a recent blood pressure reading due to a lack of equipment at home via telehealth. Vitals that have been documented are verbally provided by the patient.   Subjective:   Ryan Clarke is a 68 y.o. who presents for a Medicare Wellness preventive visit.  As a reminder, Annual Wellness Visits don't include a physical exam, and some assessments may be limited, especially if this visit is performed virtually. We may recommend an in-person follow-up visit with your provider if needed.  Visit Complete: Virtual I connected with  Ryan Clarke on 06/20/24 by a audio enabled telemedicine application and verified that I am speaking with the correct person using two identifiers.  Patient Location: Home  Provider Location: Home Office  I discussed the limitations of evaluation and management by telemedicine. The patient expressed understanding and agreed to proceed.  Vital Signs: Because this visit was a virtual/telehealth visit, some criteria may be missing or patient reported. Any vitals not documented were not able to be obtained and vitals that have been documented are patient reported.  VideoDeclined- This patient declined Librarian, academic. Therefore the visit was completed with audio only.  Persons Participating in Visit: Patient.  AWV Questionnaire: No: Patient Medicare AWV questionnaire was not completed prior to this visit.  Cardiac Risk Factors include: advanced age (>17men, >46 women);dyslipidemia;family history of premature cardiovascular disease;hypertension;male  gender;obesity (BMI >30kg/m2)     Objective:    Today's Vitals   06/20/24 1315 06/20/24 1316  Weight: 293 lb (132.9 kg)   Height: 6' 1 (1.854 m)   PainSc: 5  5   PainLoc: Generalized    Body mass index is 38.66 kg/m.     06/20/2024    1:17 PM 09/08/2023    2:00 PM 06/15/2023    1:19 PM 05/20/2023    8:30 AM 04/28/2023    4:36 PM 04/28/2023   10:38 AM 04/27/2023    2:43 PM  Advanced Directives  Does Patient Have a Medical Advance Directive? No No No No  No No  Would patient like information on creating a medical advance directive? No - Patient declined  Yes (MAU/Ambulatory/Procedural Areas - Information given) No - Patient declined No - Patient declined  No - Patient declined    Current Medications (verified) Outpatient Encounter Medications as of 06/20/2024  Medication Sig   aMILoride  (MIDAMOR ) 5 MG tablet TAKE 1 TABLET (5 MG TOTAL) BY MOUTH DAILY.   apixaban  (ELIQUIS ) 5 MG TABS tablet Take 5 mg by mouth 2 (two) times daily.   atorvastatin  (LIPITOR ) 80 MG tablet TAKE 1 TABLET BY MOUTH EVERYDAY AT BEDTIME   furosemide  (LASIX ) 40 MG tablet Take 1 tablet (40 mg total) by mouth daily as needed.   metoprolol  tartrate (LOPRESSOR ) 25 MG tablet TAKE 1 TABLET BY MOUTH TWICE A DAY   prasugrel  (EFFIENT ) 10 MG TABS tablet TAKE 1 TABLET BY MOUTH EVERY DAY   No facility-administered encounter medications on file as of 06/20/2024.    Allergies (verified) Patient has no known allergies.   History: Past Medical History:  Diagnosis Date   Carpal tunnel syndrome 09/10/2020   Coronary  artery disease    Hypertension    Nodular basal cell carcinoma (BCC) 09/04/2021   Left Lower Back   Past Surgical History:  Procedure Laterality Date   CORONARY ARTERY BYPASS GRAFT N/A 11/26/2020   Procedure: CORONARY ARTERY BYPASS GRAFTING (CABG) TIMES TWO USING BILATERAL INTERNAL MAMMARY ARTERIES;  Surgeon: Lucas Dorise POUR, MD;  Location: [facility] OR;  Service: Open Heart Surgery;  Laterality: N/A;  BIMA    CORONARY BALLOON ANGIOPLASTY N/A 09/14/2020   Procedure: CORONARY BALLOON ANGIOPLASTY;  Surgeon: Burnard Debby LABOR, MD;  Location: [facility] INVASIVE CV LAB;  Service: Cardiovascular;  Laterality: N/A;   CORONARY STENT PLACEMENT     LEFT HEART CATH AND CORONARY ANGIOGRAPHY N/A 09/14/2020   Procedure: LEFT HEART CATH AND CORONARY ANGIOGRAPHY;  Surgeon: Burnard Debby LABOR, MD;  Location: [facility]VAS[facility];  Service: Cardiovascular;  Laterality: N/A;   LEFT HEART CATH AND CORONARY ANGIOGRAPHY N/A 11/19/2020   Procedure: LEFT HEART CATH AND CORONARY ANGIOGRAPHY;  Surgeon: Burnard Debby LABOR, MD;  Location: [facility]VAS[facility];  Service: Cardiovascular;  Laterality: N/A;   PACEMAKER INSERTION     TEE WITHOUT CARDIOVERSION N/A 11/26/2020   Procedure: TRANSESOPHAGEAL ECHOCARDIOGRAM (TEE);  Surgeon: Lucas Dorise POUR, MD;  Location: [facility] OR;  Service: Open Heart Surgery;  Laterality: N/A;   Family History  Problem Relation Age of Onset   Coronary artery disease Mother    Coronary artery disease Father    Coronary artery disease Brother    Coronary artery disease Brother    Liver cancer Maternal Grandmother    Social History   Socioeconomic History   Marital status: Legally Separated    Spouse name: Not on file   Number of children: Not on file   Years of education: Not on file   Highest education level: Bachelor's degree (e.g., BA, AB, BS)  Occupational History   Not on file  Tobacco Use   Smoking status: Never   Smokeless tobacco: Never  Substance and Sexual Activity   Alcohol use: Never   Drug use: Never   Sexual activity: Not on file  Other Topics Concern   Not on file  Social History Narrative   Not on file   Social Drivers of Health   Financial Resource Strain: Low Risk  (06/20/2024)   Overall Financial Resource Strain (CARDIA)    Difficulty of Paying Living Expenses: Not hard at all  Food Insecurity: No Food Insecurity (06/20/2024)   Hunger Vital Sign    Worried About Running Out of Food in the  Last Year: Never true    Ran Out of Food in the Last Year: Never true  Transportation Needs: No Transportation Needs (06/20/2024)   PRAPARE - Administrator, Civil Service (Medical): No    Lack of Transportation (Non-Medical): No  Physical Activity: Sufficiently Active (06/20/2024)   Exercise Vital Sign    Days of Exercise per Week: 5 days    Minutes of Exercise per Session: 150+ min  Stress: No Stress Concern Present (06/20/2024)   [employer] of Occupational Health - Occupational Stress Questionnaire    Feeling of Stress: Not at all  Social Connections: Moderately Integrated (06/20/2024)   Social Connection and Isolation Panel    Frequency of Communication with Friends and Family: Twice a week    Frequency of Social Gatherings with Friends and Family: Twice a week    Attends Religious Services: More than 4 times per year    Active Member of Clubs or Organizations: No  Attends Banker Meetings: Never    Marital Status: Living with partner    Tobacco Counseling Counseling given: Not Answered    Clinical Intake:  Pre-visit preparation completed: Yes  Pain : 0-10 Pain Score: 5  Pain Type: Chronic pain Pain Location: Generalized (bilateral lower ext pain & swelling x 2 mos) Pain Orientation: Left, Right, Lower Pain Onset: More than a month ago (2 months+) Pain Frequency: Intermittent Pain Relieving Factors: Compression Socks Effect of Pain on Daily Activities: Dificult to walk  Pain Relieving Factors: Compression Socks  BMI - recorded: 38.66 Nutritional Status: BMI > 30  Obese Nutritional Risks: None Diabetes: No  Lab Results  Component Value Date   HGBA1C 5.2 11/25/2020   HGBA1C 5.7 (H) 09/10/2020     How often do you need to have someone help you when you read instructions, pamphlets, or other written materials from your doctor or pharmacy?: 1 - Never What is the last grade level you completed in school?: Bachelor's Degree  Interpreter  Needed?: No  Information entered by :: Roz Fuller, LPN.   Activities of Daily Living     06/20/2024    1:19 PM  In your present state of health, do you have any difficulty performing the following activities:  Hearing? 0  Vision? 0  Difficulty concentrating or making decisions? 0  Comment BSE: math puzzles  Walking or climbing stairs? 0  Dressing or bathing? 0  Doing errands, shopping? 0  Preparing Food and eating ? N  Using the Toilet? N  In the past six months, have you accidently leaked urine? N  Do you have problems with loss of bowel control? N  Managing your Medications? N  Managing your Finances? N  Housekeeping or managing your Housekeeping? N    Patient Care Team: Adele Song, MD as PCP - General Lonni Slain, MD as PCP - Cardiology (Cardiology) Austin Ade, MD (Inactive) as PCP - Family Medicine (Family Medicine) Fernande Elspeth BROCKS, MD as PCP - Electrophysiology (Cardiology) Livingston Rigg, MD as Consulting Physician (Dermatology) Shona Layman BROCKS, MD as Referring Physician (Urology) [facility] Fidel Rogue, MD as Consulting Physician (Orthopedic Surgery)  I have updated your Care Teams any recent Medical Services you may have received from other providers in the past year.     Assessment:   This is a routine wellness examination for Ryan Clarke.  Hearing/Vision screen Hearing Screening - Comments:: Denies hearing difficulties.  Vision Screening - Comments:: Wears rx glasses - not up to date with routine eye exams.    Goals Addressed             This Visit's Progress    06/20/2024: To figure out what's going on with my legs.         Depression Screen     06/20/2024    1:18 PM 12/17/2023    2:04 PM 09/08/2023    2:00 PM 06/15/2023    1:16 PM 05/20/2023    8:30 AM 05/19/2023    2:35 PM 05/05/2023    2:53 PM  PHQ 2/9 Scores  PHQ - 2 Score 0 0 0 0 0 0 0  PHQ- 9 Score 0 3 0 0 0 2 0    Fall Risk     06/20/2024    1:18  PM 12/17/2023    2:04 PM 09/08/2023    2:00 PM 06/11/2023    1:48 PM 05/20/2023    8:30 AM  Fall Risk   Falls in the past  year? 0 0 0 0 0  Number falls in past yr: 0 0 0 0 0  Injury with Fall? 0 0 0 0 0  Risk for fall due to : No Fall Risks      Follow up Falls evaluation completed        MEDICARE RISK AT HOME:  Medicare Risk at Home Any stairs in or around the home?: Yes If so, are there any without handrails?: No Home free of loose throw rugs in walkways, pet beds, electrical cords, etc?: Yes Adequate lighting in your home to reduce risk of falls?: Yes Life alert?: No Use of a cane, walker or w/c?: No Grab bars in the bathroom?: No Shower chair or bench in shower?: No Elevated toilet seat or a handicapped toilet?: No  TIMED UP AND GO:  Was the test performed?  No  Cognitive Function: Declined/Normal: No cognitive concerns noted by patient or family. Patient alert, oriented, able to answer questions appropriately and recall recent events. No signs of memory loss or confusion.    06/20/2024    1:21 PM  MMSE - Mini Mental State Exam  Not completed: Unable to complete        06/20/2024    1:21 PM 06/15/2023    1:19 PM  6CIT Screen  What Year? 0 points 0 points  What month? 0 points 0 points  What time? 0 points 0 points  Count back from 20 0 points 0 points  Months in reverse 0 points 0 points  Repeat phrase 0 points 0 points  Total Score 0 points 0 points    Immunizations Immunization History  Administered Date(s) Administered   Fluad Quad(high Dose 65+) 12/10/2021, 12/03/2022   Influenza,inj,Quad PF,6+ Mos 09/10/2020   PFIZER Comirnaty(Gray Top)Covid-19 Tri-Sucrose Vaccine 04/26/2021   PFIZER(Purple Top)SARS-COV-2 Vaccination 03/17/2020, 03/17/2020, 11/13/2020   PNEUMOCOCCAL CONJUGATE-20 04/26/2021    Screening Tests Health Maintenance  Topic Date Due   DTaP/Tdap/Td (1 - Tdap) Never done   Zoster Vaccines- Shingrix (1 of 2) Never done   COVID-19 Vaccine (5 -  2024-25 season) 07/19/2023   INFLUENZA VACCINE  06/17/2024   Fecal DNA (Cologuard)  12/19/2024   Medicare Annual Wellness (AWV)  06/20/2025   Pneumococcal Vaccine: 50+ Years  Completed   Hepatitis C Screening  Completed   Hepatitis B Vaccines  Aged Out   HPV VACCINES  Aged Out   Meningococcal B Vaccine  Aged Out    Health Maintenance  Health Maintenance Due  Topic Date Due   DTaP/Tdap/Td (1 - Tdap) Never done   Zoster Vaccines- Shingrix (1 of 2) Never done   COVID-19 Vaccine (5 - 2024-25 season) 07/19/2023   INFLUENZA VACCINE  06/17/2024   Health Maintenance Items Addressed:  Yes Patient aware of current care gaps.  Immunization record was verified by [employer]. Patient due for the following gaps: Covid-19, Dtap, Flu and Shingrix vaccines.  Additional Screening:  Vision Screening: Recommended annual ophthalmology exams for early detection of glaucoma and other disorders of the eye. Would you like a referral to an eye doctor? No    Dental Screening: Recommended annual dental exams for proper oral hygiene  Community Resource Referral / Chronic Care Management: CRR required this visit?  No   CCM required this visit?  No   Plan:    I have personally reviewed and noted the following in the patient's chart:   Medical and social history Use of alcohol, tobacco or illicit drugs  Current medications and supplements including opioid  prescriptions. Patient is not currently taking opioid prescriptions. Functional ability and status Nutritional status Physical activity Advanced directives List of other physicians Hospitalizations, surgeries, and ER visits in previous 12 months Vitals Screenings to include cognitive, depression, and falls Referrals and appointments  In addition, I have reviewed and discussed with patient certain preventive protocols, quality metrics, and best practice recommendations. A written personalized care plan for preventive services as well as general  preventive health recommendations were provided to patient.   Roz LOISE Fuller, LPN   11/21/7972   After Visit Summary: (MyChart) Due to this being a telephonic visit, the after visit summary with patients personalized plan was offered to patient via MyChart   Notes: Patient aware of current care gaps.  Immunization record was verified by [employer]. Patient due for the following gaps: Covid-19, Dtap, Flu and Shingrix vaccines.

## 2024-06-20 NOTE — Patient Instructions (Signed)
 Mr. Ryan Clarke , Thank you for taking time out of your busy schedule to complete your Annual Wellness Visit with me. I enjoyed our conversation and look forward to speaking with you again next year. I, as well as your care team,  appreciate your ongoing commitment to your health goals. Please review the following plan we discussed and let me know if I can assist you in the future. Your Game plan/ To Do List    Referrals: If you haven't heard from the office you've been referred to, please reach out to them at the phone provided.   Follow up Visits: We will see or speak with you next year for your Next Medicare AWV with our clinical staff Have you seen your provider in the last 6 months (3 months if uncontrolled diabetes)? Yes  Clinician Recommendations:  Aim for 30 minutes of exercise or brisk walking, 6-8 glasses of water, and 5 servings of fruits and vegetables each day.       This is a list of the screenings recommended for you:  Health Maintenance  Topic Date Due   DTaP/Tdap/Td vaccine (1 - Tdap) Never done   Zoster (Shingles) Vaccine (1 of 2) Never done   COVID-19 Vaccine (5 - 2024-25 season) 07/19/2023   Flu Shot  06/17/2024   Cologuard (Stool DNA test)  12/19/2024   Medicare Annual Wellness Visit  06/20/2025   Pneumococcal Vaccine for age over 66  Completed   Hepatitis C Screening  Completed   Hepatitis B Vaccine  Aged Out   HPV Vaccine  Aged Out   Meningitis B Vaccine  Aged Out    Advanced directives: (Declined) Advance directive discussed with you today. Even though you declined this today, please call our office should you change your mind, and we can give you the proper paperwork for you to fill out. Advance Care Planning is important because it:  [x]  Makes sure you receive the medical care that is consistent with your values, goals, and preferences  [x]  It provides guidance to your family and loved ones and reduces their decisional burden about whether or not they are making  the right decisions based on your wishes.  Follow the link provided in your after visit summary or read over the paperwork we have mailed to you to help you started getting your Advance Directives in place. If you need assistance in completing these, please reach out to us  so that we can help you!  See attachments for Preventive Care and Fall Prevention Tips.

## 2024-06-27 ENCOUNTER — Telehealth: Payer: Self-pay | Admitting: Family Medicine

## 2024-06-27 NOTE — Telephone Encounter (Signed)
 Called patient to schedule LVM to call back.   Thanks!

## 2024-06-27 NOTE — Telephone Encounter (Signed)
-----   Message from Otto Fairly sent at 06/20/2024  3:05 PM EDT ----- Hello admin,  Please help patient schedule PCP appointment to address care gap. Dr. Adele, please follow up on the following - Nurse Notes: Patient aware of current care gaps. Immunization record was verified by [employer]. Patient due for the following gaps: Covid-19, Dtap, Flu and Shingrix vaccines.  Thanks.

## 2024-08-25 NOTE — Progress Notes (Signed)
 Remote PPM Transmission

## 2024-08-28 ENCOUNTER — Other Ambulatory Visit (HOSPITAL_BASED_OUTPATIENT_CLINIC_OR_DEPARTMENT_OTHER): Payer: Self-pay | Admitting: Cardiology

## 2024-08-28 DIAGNOSIS — I25119 Atherosclerotic heart disease of native coronary artery with unspecified angina pectoris: Secondary | ICD-10-CM

## 2024-08-29 ENCOUNTER — Ambulatory Visit: Payer: Self-pay

## 2024-08-29 DIAGNOSIS — I495 Sick sinus syndrome: Secondary | ICD-10-CM | POA: Diagnosis not present

## 2024-08-30 LAB — CUP PACEART REMOTE DEVICE CHECK
Battery Remaining Longevity: 62 mo
Battery Remaining Percentage: 50 %
Battery Voltage: 2.98 V
Brady Statistic AP VP Percent: 1 %
Brady Statistic AP VS Percent: 1 %
Brady Statistic AS VP Percent: 1 %
Brady Statistic AS VS Percent: 98 %
Brady Statistic RA Percent Paced: 1 %
Brady Statistic RV Percent Paced: 1 %
Date Time Interrogation Session: 20251013210547
Implantable Lead Connection Status: 753985
Implantable Lead Connection Status: 753985
Implantable Lead Implant Date: 20190322
Implantable Lead Implant Date: 20190322
Implantable Lead Location: 753859
Implantable Lead Location: 753860
Implantable Pulse Generator Implant Date: 20190322
Lead Channel Impedance Value: 380 Ohm
Lead Channel Impedance Value: 440 Ohm
Lead Channel Pacing Threshold Amplitude: 0.75 V
Lead Channel Pacing Threshold Amplitude: 1 V
Lead Channel Pacing Threshold Pulse Width: 0.5 ms
Lead Channel Pacing Threshold Pulse Width: 1 ms
Lead Channel Sensing Intrinsic Amplitude: 0.3 mV
Lead Channel Sensing Intrinsic Amplitude: 5.8 mV
Lead Channel Setting Pacing Amplitude: 2.5 V
Lead Channel Setting Pacing Amplitude: 2.5 V
Lead Channel Setting Pacing Pulse Width: 0.5 ms
Lead Channel Setting Sensing Sensitivity: 2.5 mV
Pulse Gen Model: 2272
Pulse Gen Serial Number: 8996724

## 2024-08-31 NOTE — Progress Notes (Signed)
 Remote PPM Transmission

## 2024-09-02 ENCOUNTER — Other Ambulatory Visit (HOSPITAL_BASED_OUTPATIENT_CLINIC_OR_DEPARTMENT_OTHER): Payer: Self-pay | Admitting: Cardiology

## 2024-09-02 DIAGNOSIS — R600 Localized edema: Secondary | ICD-10-CM

## 2024-09-23 ENCOUNTER — Ambulatory Visit: Payer: Self-pay | Admitting: Cardiology

## 2024-10-07 ENCOUNTER — Ambulatory Visit (INDEPENDENT_AMBULATORY_CARE_PROVIDER_SITE_OTHER): Admitting: Family Medicine

## 2024-10-07 VITALS — BP 124/73 | HR 54 | Ht 73.0 in | Wt 289.4 lb

## 2024-10-07 DIAGNOSIS — Z23 Encounter for immunization: Secondary | ICD-10-CM | POA: Diagnosis not present

## 2024-10-07 DIAGNOSIS — H6121 Impacted cerumen, right ear: Secondary | ICD-10-CM

## 2024-10-07 DIAGNOSIS — B9789 Other viral agents as the cause of diseases classified elsewhere: Secondary | ICD-10-CM

## 2024-10-07 DIAGNOSIS — J329 Chronic sinusitis, unspecified: Secondary | ICD-10-CM

## 2024-10-07 NOTE — Progress Notes (Signed)
    SUBJECTIVE:   CHIEF COMPLAINT / HPI:   HA, sinus congestion --about 1 week --feels symptoms are improving overall, wife has respiratory issues so he doesn't want to get her sick --R ear bothering him, feels that maybe some fluid is coming out and also feels itchy --no cough or SOB --fever once, maybe to 100 --coworkers sick --eating and drinking well -- Declines COVID and flu swab today   Advised by cardiologist not to have COVID vaccine because it triggered Afib previously Wants flu shot today  PERTINENT  PMH / PSH: CAD, HTN, Afib, pacemaker in place, GERD  OBJECTIVE:   BP 124/73   Pulse (!) 54   Ht 6' 1 (1.854 m)   Wt 289 lb 6.4 oz (131.3 kg)   SpO2 95%   BMI 38.18 kg/m   General: Awake and conversant, no acute distress HEENT: Normocephalic and atraumatic, no tenderness to palpation at frontal or maxillary sinus areas, left TM normal, right TM with cerumen impaction, some fluid seen behind TM after irrigation.  No rhinorrhea.  Clear conjunctiva bilaterally.  MMM Pulm: CTAB, normal work of breathing on room air, no W/R/R. Neuro: No focal deficits Psych: Appropriate mood and affect   ASSESSMENT/PLAN:   Assessment & Plan Viral sinusitis Presentation most consistent with viral sinusitis.  Patient appears well on exam.  Advised symptomatic treatment with ibuprofen or Tylenol  and good hydration.  Discussed return precautions. Cerumen debris on tympanic membrane of right ear Irrigation completed.  Advised use of hydrogen peroxide and warm water lavage to avoid cerumen buildup     Rea Raring, MD [facility]

## 2024-10-07 NOTE — Patient Instructions (Signed)
 Thank you for coming in today! Here is a summary of what we discussed:  Sounds like your symptoms are most likely due to a viral respiratory infection.  You can use ibuprofen or Tylenol  as needed for symptom relief.  Please be sure to stay well hydrated.  Warm fluids with honey can help with sore throat. Please let us  know if your symptoms do not improve or if they worsen in the next few days.   You can use hydrogen peroxide and/or warm water to help avoid ear wax build up.  Please call the clinic at [phone] if your symptoms worsen or you have any concerns.  Best, Dr Adele

## 2024-10-21 ENCOUNTER — Ambulatory Visit (HOSPITAL_BASED_OUTPATIENT_CLINIC_OR_DEPARTMENT_OTHER): Admitting: Cardiology

## 2024-10-21 ENCOUNTER — Encounter (HOSPITAL_BASED_OUTPATIENT_CLINIC_OR_DEPARTMENT_OTHER): Payer: Self-pay

## 2024-10-21 NOTE — Progress Notes (Incomplete)
 Cardiology Office Note:  .    Date:  10/21/2024  ID:  Ryan Clarke, DOB 09-11-1956, MRN [id number] PCP: Adele Song, MD  [facility] Providers Cardiologist:  Shelda Bruckner, MD Electrophysiologist:  Elspeth Sage, MD (Inactive)     History of Present Illness: Ryan Clarke    Ryan Clarke is a 68 y.o. male with a hx of CAD with many stents remotely, now s/p CABGx2 11/2020, pacemaker, who is seen for close follow up. I initially met him 08/14/20 as a referral for palpitations, but on discussion discovered extensive CAD history and angina.   Cardiac history: Had all of his prior cardiac care as part of [facility] in [place]. Not available in Care Everywhere. Reports history of 29 stents, last stent 06/2019. Moved to [place] in 05/2020. S/P CABGx2 by Dr. Lucas on 11/26/20, complicated by post op atrial fibrillation. Due to recurrence of paroxysmal atrial fib, continued on anticoagulation.   Today: Back to work for the last week after trying retirement, doing well. Takes fluid pill daily right now, feels this works well. 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  Palpitations are stable, brief/infrequent. Reviewed his visit with Daphne Barrack in EP in 12/2023.  Apixaban  is about $66/mo, discussed generic   ROS:  Denies chest pain, shortness of breath at rest or with normal exertion. No PND, orthopnea, or unexpected weight gain. No syncope. ROS otherwise negative except as noted.    Studies Reviewed: Ryan Clarke        Physical Exam:    VS:  There were no vitals taken for this visit.   Wt Readings from Last 3 Encounters:  10/07/24 289 lb 6.4 oz (131.3 kg)  06/20/24 293 lb (132.9 kg)  03/16/24 296 lb 1.6 oz (134.3 kg)    GEN: Well nourished, well developed in no acute distress HEENT: Normal, moist mucous membranes NECK: No JVD CARDIAC: regular rhythm, normal S1 and S2, no rubs or gallops. No murmur. VASCULAR: Radial and DP pulses 2+ bilaterally. No carotid bruits RESPIRATORY:  Clear to auscultation  without rales, wheezing or rhonchi  ABDOMEN: Soft, non-tender, non-distended MUSCULOSKELETAL:  Ambulates independently SKIN: Warm and dry, no significant LE edema. NEUROLOGIC:  Alert and oriented x 3. No focal neuro deficits noted. PSYCHIATRIC:  Normal affect   ASSESSMENT AND PLAN: .    LE edema -none today on exam, reports this is intermittent. Previously recommended PRN lasix , using daily. He will monitor and see if he can use lasix  less frequently in the future  Coronary artery disease, with reported 29 prior stents in [place]  (no records), now s/p 2V CABG 11/2020 -no recent angina -instructed on red flag warning signs that need immediate medical attention -continue prasugrel  + apixaban  vs. Change to clopidogrel in the future. We discussed, doing well on current regimen, continue prasugrel  at this time -continue atorvastatin  -continue metoprolol . 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 -continue metoprolol    Hyperlipidemia: -LDL goal <70 -continue atorvastatin    Cardiac risk counseling and prevention recommendations: -recommend heart healthy/Mediterranean diet, with whole grains, fruits, vegetable, fish, lean meats, nuts, and olive oil. Limit salt. -recommend moderate walking, 3-5 times/week for 30-50 minutes each session. Aim for at least 150 minutes.week. Goal should be pace of  3 miles/hours, or walking 1.5 miles in 30 minutes -recommend avoidance of tobacco products. Avoid excess alcohol.  Dispo: Follow-up in 6 mos or sooner as needed.  Signed, Shelda Bruckner, MD

## 2024-11-28 ENCOUNTER — Other Ambulatory Visit: Payer: Self-pay

## 2024-11-28 ENCOUNTER — Ambulatory Visit (INDEPENDENT_AMBULATORY_CARE_PROVIDER_SITE_OTHER): Payer: Self-pay

## 2024-11-28 DIAGNOSIS — I495 Sick sinus syndrome: Secondary | ICD-10-CM | POA: Diagnosis not present

## 2024-11-28 DIAGNOSIS — E7800 Pure hypercholesterolemia, unspecified: Secondary | ICD-10-CM

## 2024-11-29 LAB — CUP PACEART REMOTE DEVICE CHECK
Battery Remaining Longevity: 59 mo
Battery Remaining Percentage: 48 %
Battery Voltage: 2.98 V
Brady Statistic AP VP Percent: 1 %
Brady Statistic AP VS Percent: 1 %
Brady Statistic AS VP Percent: 1 %
Brady Statistic AS VS Percent: 98 %
Brady Statistic RA Percent Paced: 1 %
Brady Statistic RV Percent Paced: 1 %
Date Time Interrogation Session: 20260113004849
Implantable Lead Connection Status: 753985
Implantable Lead Connection Status: 753985
Implantable Lead Implant Date: 20190322
Implantable Lead Implant Date: 20190322
Implantable Lead Location: 753859
Implantable Lead Location: 753860
Implantable Pulse Generator Implant Date: 20190322
Lead Channel Impedance Value: 390 Ohm
Lead Channel Impedance Value: 410 Ohm
Lead Channel Pacing Threshold Amplitude: 0.75 V
Lead Channel Pacing Threshold Amplitude: 1 V
Lead Channel Pacing Threshold Pulse Width: 0.5 ms
Lead Channel Pacing Threshold Pulse Width: 1 ms
Lead Channel Sensing Intrinsic Amplitude: 0.8 mV
Lead Channel Sensing Intrinsic Amplitude: 6.4 mV
Lead Channel Setting Pacing Amplitude: 2.5 V
Lead Channel Setting Pacing Amplitude: 2.5 V
Lead Channel Setting Pacing Pulse Width: 0.5 ms
Lead Channel Setting Sensing Sensitivity: 2.5 mV
Pulse Gen Model: 2272
Pulse Gen Serial Number: 8996724

## 2024-11-29 MED ORDER — METOPROLOL TARTRATE 25 MG PO TABS: 25.0000 mg | ORAL_TABLET | Freq: Two times a day (BID) | ORAL | 0 refills | Status: AC

## 2024-11-29 MED ORDER — ATORVASTATIN CALCIUM 80 MG PO TABS: 80.0000 mg | ORAL_TABLET | Freq: Every day | ORAL | 0 refills | Status: AC

## 2024-12-02 NOTE — Progress Notes (Signed)
 Remote PPM Transmission

## 2024-12-04 ENCOUNTER — Ambulatory Visit: Payer: Self-pay | Admitting: Cardiology

## 2024-12-09 ENCOUNTER — Ambulatory Visit: Payer: Self-pay | Admitting: Family Medicine

## 2024-12-14 ENCOUNTER — Ambulatory Visit: Admitting: Family Medicine

## 2024-12-14 VITALS — BP 132/75 | HR 57 | Ht 73.0 in | Wt 298.4 lb

## 2024-12-14 DIAGNOSIS — I10 Essential (primary) hypertension: Secondary | ICD-10-CM | POA: Diagnosis not present

## 2024-12-14 DIAGNOSIS — Z950 Presence of cardiac pacemaker: Secondary | ICD-10-CM | POA: Diagnosis not present

## 2024-12-14 DIAGNOSIS — I480 Paroxysmal atrial fibrillation: Secondary | ICD-10-CM

## 2024-12-14 DIAGNOSIS — M79675 Pain in left toe(s): Secondary | ICD-10-CM | POA: Diagnosis not present

## 2024-12-14 NOTE — Assessment & Plan Note (Signed)
 Appropriate HR today. Continue Eliquis  daily.  Has cardiology follow up in early February

## 2024-12-14 NOTE — Assessment & Plan Note (Signed)
 Reviewed note about pacemaker remote interrogation from 1/18, appropriate. Cards follow up in Feb as above

## 2024-12-14 NOTE — Progress Notes (Signed)
" ° ° °  SUBJECTIVE:   CHIEF COMPLAINT / HPI:   L 2nd toe pain --started about 2 weeks ago --tender to touch --has had issues with toenail in the past  --no trauma to the area --has not seen podiatry --having knee and hip pain on the same side due to compensating gait with toe pain --takes tylenol  for arthritis pain --no NSAIDs as he takes Eliquis    PERTINENT  PMH / PSH: CAD, HTN, Afib, OA  OBJECTIVE:   BP 132/75   Pulse (!) 57   Ht 6' 1 (1.854 m)   Wt 298 lb 6.4 oz (135.4 kg)   SpO2 96%   BMI 39.37 kg/m   General: Awake and conversant, no acute distress Pulm: normal work of breathing on room air Neuro: No focal deficits Psych: Appropriate mood and affect Ext: L 2nd toe with callous at distal end, some distal nail changes without signs of fungal infection. Moderate TTP. No warmth or edema.  ASSESSMENT/PLAN:   Assessment & Plan Toe pain, left Callus formation likely secondary to toe anatomy/positioning. No signs of nailbed infection. Performed skin shaving to remove part of callus. Sent referral to podiatry for further management and preventative care.  Callus removal procedure note PRE-OP DIAGNOSIS: L 2nd toe pain/callus POST-OP DIAGNOSIS: Same  PROCEDURE: callus removal Performing Physician: Murat Rideout  Supervising Physician (if applicable): Pruett  PROCEDURE:  x  Shave callus removal  The area surrounding the skin lesion was prepared and draped in the  usual sterile manner. Adjacent interdigital spaces injected with 4 mL 1% lidocaine . The lesion was removed in the usual manner using shave biopsy method noted above. Hemostasis was assured. The patient tolerated  the procedure well.  Closure:     _  Monsels for hemostasis              _  suture _                   x  None  Followup: The patient tolerated the procedure well without  complications.  Standard post-procedure care is explained and return precautions are given. Primary hypertension BP well controlled today.  Continue current regimen Paroxysmal atrial fibrillation (HCC) Appropriate HR today. Continue Eliquis  daily.  Has cardiology follow up in early February Pacemaker - STJ Reviewed note about pacemaker remote interrogation from 1/18, appropriate. Cards follow up in Feb as above     Rea Raring, MD [facility] "

## 2024-12-14 NOTE — Patient Instructions (Addendum)
 Thank you for coming in today! Here is a summary of what we discussed:  -I sent a referral to podiatry, you should hear from them in 1-2 weeks. Please let us  know if you don't hear anything in that time frame  -We recommend keeping your toe protected with some gauze when you're wearing shoes  -Please let us  know if your pain gets worse or if you notice any redness, warmth, or swelling in the area  Please call the clinic at [phone] if your symptoms worsen or you have any concerns.  Best, Dr Adele

## 2024-12-14 NOTE — Assessment & Plan Note (Signed)
 BP well controlled today. Continue current regimen.

## 2024-12-19 ENCOUNTER — Emergency Department (HOSPITAL_COMMUNITY)
Admission: EM | Admit: 2024-12-19 | Discharge: 2024-12-19 | Disposition: A | Discharge status: Home / Self Care | Attending: Emergency Medicine | Admitting: Emergency Medicine

## 2024-12-19 ENCOUNTER — Emergency Department (HOSPITAL_COMMUNITY)

## 2024-12-19 ENCOUNTER — Other Ambulatory Visit: Payer: Self-pay

## 2024-12-19 ENCOUNTER — Encounter (HOSPITAL_COMMUNITY): Payer: Self-pay | Admitting: Emergency Medicine

## 2024-12-19 DIAGNOSIS — Z951 Presence of aortocoronary bypass graft: Secondary | ICD-10-CM | POA: Insufficient documentation

## 2024-12-19 DIAGNOSIS — R079 Chest pain, unspecified: Secondary | ICD-10-CM | POA: Insufficient documentation

## 2024-12-19 DIAGNOSIS — Z79899 Other long term (current) drug therapy: Secondary | ICD-10-CM | POA: Insufficient documentation

## 2024-12-19 DIAGNOSIS — I2510 Atherosclerotic heart disease of native coronary artery without angina pectoris: Secondary | ICD-10-CM | POA: Insufficient documentation

## 2024-12-19 DIAGNOSIS — R1011 Right upper quadrant pain: Secondary | ICD-10-CM | POA: Insufficient documentation

## 2024-12-19 DIAGNOSIS — Z7901 Long term (current) use of anticoagulants: Secondary | ICD-10-CM | POA: Insufficient documentation

## 2024-12-19 LAB — CBC
HCT: 52.9 % — ABNORMAL HIGH (ref 39.0–52.0)
Hemoglobin: 17.5 g/dL — ABNORMAL HIGH (ref 13.0–17.0)
MCH: 30.9 pg (ref 26.0–34.0)
MCHC: 33.1 g/dL (ref 30.0–36.0)
MCV: 93.3 fL (ref 80.0–100.0)
Platelets: 219 10*3/uL (ref 150–400)
RBC: 5.67 MIL/uL (ref 4.22–5.81)
RDW: 12.6 % (ref 11.5–15.5)
WBC: 5.7 10*3/uL (ref 4.0–10.5)
nRBC: 0 % (ref 0.0–0.2)

## 2024-12-19 LAB — BASIC METABOLIC PANEL WITH GFR
Anion gap: 12 (ref 5–15)
BUN: 12 mg/dL (ref 8–23)
CO2: 21 mmol/L — ABNORMAL LOW (ref 22–32)
Calcium: 9 mg/dL (ref 8.9–10.3)
Chloride: 104 mmol/L (ref 98–111)
Creatinine, Ser: 0.83 mg/dL (ref 0.61–1.24)
GFR, Estimated: >60 mL/min
Glucose, Bld: 142 mg/dL — ABNORMAL HIGH (ref 70–99)
Potassium: 3.9 mmol/L (ref 3.5–5.1)
Sodium: 137 mmol/L (ref 135–145)

## 2024-12-19 LAB — HEPATIC FUNCTION PANEL
ALT: 45 U/L — ABNORMAL HIGH (ref 0–44)
AST: 35 U/L (ref 15–41)
Albumin: 4 g/dL (ref 3.5–5.0)
Alkaline Phosphatase: 86 U/L (ref 38–126)
Bilirubin, Direct: 0.4 mg/dL — ABNORMAL HIGH (ref 0.0–0.2)
Indirect Bilirubin: 1 mg/dL — ABNORMAL HIGH (ref 0.3–0.9)
Total Bilirubin: 1.3 mg/dL — ABNORMAL HIGH (ref 0.0–1.2)
Total Protein: 6.9 g/dL (ref 6.5–8.1)

## 2024-12-19 LAB — LIPASE, BLOOD: Lipase: 47 U/L (ref 11–51)

## 2024-12-19 LAB — TROPONIN T, HIGH SENSITIVITY
Troponin T High Sensitivity: 17 ng/L (ref 0–19)
Troponin T High Sensitivity: 17 ng/L (ref 0–19)

## 2024-12-19 MED ORDER — ASPIRIN 81 MG PO CHEW
324.0000 mg | CHEWABLE_TABLET | Freq: Once | ORAL | Status: AC
  Administered 2024-12-19: 324 mg via ORAL
  Filled 2024-12-19: qty 4

## 2024-12-19 NOTE — ED Notes (Signed)
 Patient transported to Ultrasound

## 2024-12-19 NOTE — ED Notes (Signed)
 Patient transported to X-ray

## 2024-12-19 NOTE — ED Provider Notes (Signed)
 " [facility] Provider Note   CSN: [id number] Arrival date & time: 12/19/24  9262     Patient presents with: Chest Pain   Ryan Clarke is a 69 y.o. male.   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  He states that this morning about an hour ago he developed intense chest pain.  He states that normally when he takes his meds or eats and bends over he will get a discomfort in his upper abdomen.  Today after taking his meds and bending over he got a much more intense pain in his upper abdomen and this time went into his chest.  It felt like he might throw up.  He did not have any diaphoresis or radiation of the pain into his neck, arms, back.  This felt different than when he has had anginal pain in the past which was more of a classic chest pressure with radiation.  Symptoms lasted about 30 seconds or so but were intense enough that it scared him about his heart and he wanted to get checked out.  He is currently asymptomatic.  No recent leg swelling.  Prior to Admission medications  Medication Sig Start Date End Date Taking? Authorizing Provider  aMILoride  (MIDAMOR ) 5 MG tablet TAKE 1 TABLET (5 MG TOTAL) BY MOUTH DAILY. 03/07/24   Hindel, Rea, MD  apixaban  (ELIQUIS ) 5 MG TABS tablet Take 5 mg by mouth 2 (two) times daily.    [provider]  atorvastatin  (LIPITOR ) 80 MG tablet Take 1 tablet (80 mg total) by mouth at bedtime. 11/29/24   Lonni Slain, MD  furosemide  (LASIX ) 40 MG tablet Take 1 tablet (40 mg total) by mouth daily as needed. FOR FLUID 09/06/24   Lonni Slain, MD  metoprolol  tartrate (LOPRESSOR ) 25 MG tablet Take 1 tablet (25 mg total) by mouth 2 (two) times daily. 11/29/24   Lonni Slain, MD  prasugrel  (EFFIENT ) 10 MG TABS tablet TAKE 1 TABLET BY MOUTH EVERY DAY 08/29/24   Lonni Slain, MD    Allergies: Patient has no known allergies.    Review of  Systems  Constitutional:  Negative for diaphoresis.  Respiratory:  Positive for shortness of breath (during the episode, now gone).   Cardiovascular:  Positive for chest pain. Negative for leg swelling.  Gastrointestinal:  Positive for abdominal pain. Negative for vomiting.    Updated Vital Signs BP (!) 162/83 (BP Location: Right Arm)   Pulse 65   Temp 97.9 F (36.6 C)   Resp 18   SpO2 93%   Physical Exam Vitals and nursing note reviewed.  Constitutional:      General: He is not in acute distress.    Appearance: He is well-developed. He is not ill-appearing or diaphoretic.  HENT:     Head: Normocephalic and atraumatic.  Cardiovascular:     Rate and Rhythm: Normal rate and regular rhythm.     Heart sounds: Normal heart sounds.  Pulmonary:     Effort: Pulmonary effort is normal.     Breath sounds: Normal breath sounds.  Abdominal:     Palpations: Abdomen is soft.     Tenderness: There is no abdominal tenderness.  Skin:    General: Skin is warm and dry.  Neurological:     Mental Status: He is alert.     (all labs ordered are listed, but only abnormal results are displayed) Labs Reviewed  BASIC METABOLIC PANEL WITH  GFR - Abnormal; Notable for the following components:      Result Value   CO2 21 (*)    Glucose, Bld 142 (*)    All other components within normal limits  CBC - Abnormal; Notable for the following components:   Hemoglobin 17.5 (*)    HCT 52.9 (*)    All other components within normal limits  HEPATIC FUNCTION PANEL - Abnormal; Notable for the following components:   ALT 45 (*)    Total Bilirubin 1.3 (*)    Bilirubin, Direct 0.4 (*)    Indirect Bilirubin 1.0 (*)    All other components within normal limits  LIPASE, BLOOD  TROPONIN T, HIGH SENSITIVITY  TROPONIN T, HIGH SENSITIVITY    EKG: EKG Interpretation Date/Time:  Monday December 19 2024 07:43:45 EST Ventricular Rate:  66 PR Interval:  198 QRS Duration:  158 QT Interval:  428 QTC  Calculation: 448 R Axis:   -16  Text Interpretation: Normal sinus rhythm Right bundle branch block Inferior infarct , age undetermined  no significant change since 2024 Confirmed by Freddi Hamilton [phone]) on 12/19/2024 7:50:10 AM  Radiology: US  Abdomen Limited RUQ (LIVER/GB) Result Date: 12/19/2024 CLINICAL DATA:  Cholelithiasis. EXAM: ULTRASOUND ABDOMEN LIMITED RIGHT UPPER QUADRANT COMPARISON:  None Available. FINDINGS: Gallbladder: No gallstones or wall thickening. No sonographic Murphy sign. 6 mm polyp. Common bile duct: Diameter: 5 mm, within normal limits. No intrahepatic biliary ductal dilatation. Liver: Diffusely increased in echogenicity. Left hepatic lobe cyst measures 1.6 cm. Portal vein is patent on color Doppler imaging with normal direction of blood flow towards the liver. Other: None. IMPRESSION: 1. No acute findings. 2. Hepatic steatosis. Electronically Signed   By: Newell Eke M.D.   On: 12/19/2024 09:15   DG Chest 2 View Result Date: 12/19/2024 EXAM: 2 VIEW(S) XRAY OF THE CHEST 12/19/2024 08:12:00 AM COMPARISON: None available. CLINICAL HISTORY: Chest pain. FINDINGS: LINES, TUBES AND DEVICES: Left chest wall pacing device. LUNGS AND PLEURA: No focal pulmonary opacity. No pleural effusion. No pneumothorax. HEART AND MEDIASTINUM: Median sternotomy changes. Left chest pacemaker with leads terminating in the right atrium and right ventricle. BONES AND SOFT TISSUES: Degenerative spine changes. Osteopenia. IMPRESSION: 1. No acute cardiopulmonary abnormality. Electronically signed by: Rogelia Myers MD 12/19/2024 08:19 AM EST RP Workstation: [id number]     Procedures   Medications Ordered in the ED  aspirin  chewable tablet 324 mg (has no administration in time range)                                    Medical Decision Making Amount and/or Complexity of Data Reviewed Labs: ordered.    Details: Normal troponins x 2. Radiology: ordered and independent interpretation performed.     Details: No cholecystitis ECG/medicine tests: ordered and independent interpretation performed.    Details: No acute ischemia  Risk OTC drugs.   Patient presents with very brief and transient pain from his abdomen going into his chest.  Has not recurred.  Feels way different than when he had MI/anginal type pain.  He has a benign exam otherwise.  Given it was starting in his upper abdomen a right upper quadrant ultrasound was ordered since he has had a prior history of gallstones but his ultrasound is benign.  Troponins are negative and flat.  I doubt this was an acute coronary syndrome.  Discussed I cannot rule out all heart disease but I do not think  he needs admission.  He has follow-up with his cardiologist in about a week already.  Will give return precautions but he appears stable for discharge.     Final diagnoses:  Nonspecific chest pain    ED Discharge Orders     None          Freddi Hamilton, MD 12/19/24 1351  "

## 2024-12-27 ENCOUNTER — Ambulatory Visit (HOSPITAL_BASED_OUTPATIENT_CLINIC_OR_DEPARTMENT_OTHER): Admitting: Cardiology

## 2025-01-05 ENCOUNTER — Ambulatory Visit (HOSPITAL_BASED_OUTPATIENT_CLINIC_OR_DEPARTMENT_OTHER): Admitting: Cardiology
